# Patient Record
Sex: Male | Born: 1965 | Race: White | Hispanic: No | Smoking: Current some day smoker
Health system: Southern US, Community
[De-identification: ages and names within clinical notes are randomized; demographics above are authoritative.]

## PROBLEM LIST (undated history)

## (undated) DIAGNOSIS — D472 Monoclonal gammopathy: Secondary | ICD-10-CM

## (undated) DIAGNOSIS — M109 Gout, unspecified: Secondary | ICD-10-CM

## (undated) DIAGNOSIS — I10 Essential (primary) hypertension: Secondary | ICD-10-CM

## (undated) DIAGNOSIS — I639 Cerebral infarction, unspecified: Secondary | ICD-10-CM

## (undated) DIAGNOSIS — G4733 Obstructive sleep apnea (adult) (pediatric): Secondary | ICD-10-CM

## (undated) DIAGNOSIS — N183 Chronic kidney disease, stage 3 unspecified: Secondary | ICD-10-CM

## (undated) DIAGNOSIS — E669 Obesity, unspecified: Secondary | ICD-10-CM

## (undated) DIAGNOSIS — Z9989 Dependence on other enabling machines and devices: Secondary | ICD-10-CM

---
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
## 2012-04-07 DIAGNOSIS — I213 ST elevation (STEMI) myocardial infarction of unspecified site: Secondary | ICD-10-CM

## 2012-04-07 HISTORY — DX: ST elevation (STEMI) myocardial infarction of unspecified site: I21.3

## 2012-04-07 HISTORY — PX: CORONARY ANGIOPLASTY WITH STENT PLACEMENT: SHX49

## 2012-05-07 DIAGNOSIS — I2699 Other pulmonary embolism without acute cor pulmonale: Secondary | ICD-10-CM

## 2012-05-07 HISTORY — DX: Other pulmonary embolism without acute cor pulmonale: I26.99

## 2013-09-16 ENCOUNTER — Inpatient Hospital Stay (HOSPITAL_COMMUNITY)
Admission: EM | Admit: 2013-09-16 | Discharge: 2013-09-19 | DRG: 065 | Disposition: A | Discharge status: Home Health | Payer: BC Managed Care – PPO | Source: Other Acute Inpatient Hospital | Attending: Neurology | Admitting: Neurology

## 2013-09-16 ENCOUNTER — Encounter (HOSPITAL_COMMUNITY)
Admission: EM | Disposition: A | Discharge status: Home Health | Payer: Self-pay | Source: Other Acute Inpatient Hospital | Attending: Neurology

## 2013-09-16 ENCOUNTER — Other Ambulatory Visit: Payer: Self-pay | Admitting: Neurology

## 2013-09-16 ENCOUNTER — Inpatient Hospital Stay (HOSPITAL_COMMUNITY): Payer: BC Managed Care – PPO | Admitting: Certified Registered Nurse Anesthetist

## 2013-09-16 ENCOUNTER — Encounter (HOSPITAL_COMMUNITY): Payer: BC Managed Care – PPO | Admitting: Certified Registered Nurse Anesthetist

## 2013-09-16 ENCOUNTER — Ambulatory Visit (HOSPITAL_COMMUNITY)
Admission: RE | Admit: 2013-09-16 | Discharge: 2013-09-16 | Disposition: A | Discharge status: Home / Self Care | Payer: BC Managed Care – PPO | Source: Ambulatory Visit | Attending: Neurology | Admitting: Neurology

## 2013-09-16 DIAGNOSIS — Z86711 Personal history of pulmonary embolism: Secondary | ICD-10-CM | POA: Diagnosis not present

## 2013-09-16 DIAGNOSIS — Z23 Encounter for immunization: Secondary | ICD-10-CM

## 2013-09-16 DIAGNOSIS — R29898 Other symptoms and signs involving the musculoskeletal system: Secondary | ICD-10-CM | POA: Diagnosis present

## 2013-09-16 DIAGNOSIS — Z7982 Long term (current) use of aspirin: Secondary | ICD-10-CM

## 2013-09-16 DIAGNOSIS — Z9861 Coronary angioplasty status: Secondary | ICD-10-CM

## 2013-09-16 DIAGNOSIS — I252 Old myocardial infarction: Secondary | ICD-10-CM

## 2013-09-16 DIAGNOSIS — I6350 Cerebral infarction due to unspecified occlusion or stenosis of unspecified cerebral artery: Principal | ICD-10-CM | POA: Diagnosis present

## 2013-09-16 DIAGNOSIS — M4626 Osteomyelitis of vertebra, lumbar region: Secondary | ICD-10-CM

## 2013-09-16 DIAGNOSIS — G4733 Obstructive sleep apnea (adult) (pediatric): Secondary | ICD-10-CM | POA: Diagnosis present

## 2013-09-16 DIAGNOSIS — I2510 Atherosclerotic heart disease of native coronary artery without angina pectoris: Secondary | ICD-10-CM | POA: Diagnosis present

## 2013-09-16 DIAGNOSIS — Z6841 Body Mass Index (BMI) 40.0 and over, adult: Secondary | ICD-10-CM | POA: Diagnosis not present

## 2013-09-16 DIAGNOSIS — M109 Gout, unspecified: Secondary | ICD-10-CM | POA: Diagnosis present

## 2013-09-16 DIAGNOSIS — E7800 Pure hypercholesterolemia, unspecified: Secondary | ICD-10-CM | POA: Diagnosis present

## 2013-09-16 DIAGNOSIS — I10 Essential (primary) hypertension: Secondary | ICD-10-CM | POA: Diagnosis present

## 2013-09-16 DIAGNOSIS — M1A0691 Idiopathic chronic gout, unspecified knee, with tophus (tophi): Secondary | ICD-10-CM

## 2013-09-16 DIAGNOSIS — I639 Cerebral infarction, unspecified: Secondary | ICD-10-CM | POA: Diagnosis present

## 2013-09-16 DIAGNOSIS — R471 Dysarthria and anarthria: Secondary | ICD-10-CM | POA: Diagnosis present

## 2013-09-16 DIAGNOSIS — E669 Obesity, unspecified: Secondary | ICD-10-CM | POA: Diagnosis present

## 2013-09-16 DIAGNOSIS — G8190 Hemiplegia, unspecified affecting unspecified side: Secondary | ICD-10-CM | POA: Diagnosis present

## 2013-09-16 DIAGNOSIS — H539 Unspecified visual disturbance: Secondary | ICD-10-CM

## 2013-09-16 HISTORY — DX: Gout, unspecified: M10.9

## 2013-09-16 HISTORY — DX: Obesity, unspecified: E66.9

## 2013-09-16 HISTORY — DX: Essential (primary) hypertension: I10

## 2013-09-16 HISTORY — PX: RADIOLOGY WITH ANESTHESIA: SHX6223

## 2013-09-16 LAB — MRSA PCR SCREENING: MRSA BY PCR: POSITIVE — AB

## 2013-09-16 SURGERY — RADIOLOGY WITH ANESTHESIA
Anesthesia: General

## 2013-09-16 MED ORDER — ACETAMINOPHEN 325 MG PO TABS
650.0000 mg | ORAL_TABLET | ORAL | Status: DC | PRN
  Administered 2013-09-18: 650 mg via ORAL
  Filled 2013-09-16: qty 2

## 2013-09-16 MED ORDER — LABETALOL HCL 5 MG/ML IV SOLN
10.0000 mg | INTRAVENOUS | Status: DC | PRN
  Administered 2013-09-17 – 2013-09-18 (×2): 10 mg via INTRAVENOUS
  Filled 2013-09-16: qty 4

## 2013-09-16 MED ORDER — CHLORHEXIDINE GLUCONATE CLOTH 2 % EX PADS
6.0000 | MEDICATED_PAD | Freq: Every day | CUTANEOUS | Status: DC
  Administered 2013-09-17 – 2013-09-19 (×3): 6 via TOPICAL

## 2013-09-16 MED ORDER — ACETAMINOPHEN 650 MG RE SUPP
650.0000 mg | RECTAL | Status: DC | PRN
  Filled 2013-09-16: qty 1

## 2013-09-16 MED ORDER — SODIUM CHLORIDE 0.9 % IV SOLN
INTRAVENOUS | Status: DC | PRN
  Administered 2013-09-16: 14:00:00 via INTRAVENOUS

## 2013-09-16 MED ORDER — INFLUENZA VAC SPLIT QUAD 0.5 ML IM SUSY
0.5000 mL | PREFILLED_SYRINGE | INTRAMUSCULAR | Status: AC
  Administered 2013-09-17: 0.5 mL via INTRAMUSCULAR
  Filled 2013-09-16: qty 0.5

## 2013-09-16 MED ORDER — MUPIROCIN 2 % EX OINT
1.0000 "application " | TOPICAL_OINTMENT | Freq: Two times a day (BID) | CUTANEOUS | Status: DC
  Administered 2013-09-17 – 2013-09-19 (×5): 1 via NASAL
  Filled 2013-09-16: qty 22

## 2013-09-16 MED ORDER — PANTOPRAZOLE SODIUM 40 MG IV SOLR
40.0000 mg | Freq: Every day | INTRAVENOUS | Status: DC
  Administered 2013-09-16: 40 mg via INTRAVENOUS
  Filled 2013-09-16 (×2): qty 40

## 2013-09-16 MED ORDER — SODIUM CHLORIDE 0.9 % IV SOLN: INTRAVENOUS | Status: DC

## 2013-09-16 MED ORDER — SENNOSIDES-DOCUSATE SODIUM 8.6-50 MG PO TABS
1.0000 | ORAL_TABLET | Freq: Every evening | ORAL | Status: DC | PRN
  Filled 2013-09-16: qty 1

## 2013-09-16 MED ORDER — LIDOCAINE HCL 1 % IJ SOLN
INTRAMUSCULAR | Status: AC
  Filled 2013-09-16: qty 20

## 2013-09-16 MED ORDER — FENTANYL CITRATE 0.05 MG/ML IJ SOLN
INTRAMUSCULAR | Status: DC | PRN
Start: 2013-09-16 — End: 2013-09-16
  Administered 2013-09-16 (×2): 25 ug via INTRAVENOUS

## 2013-09-16 MED ORDER — IOHEXOL 300 MG/ML  SOLN
150.0000 mL | Freq: Once | INTRAMUSCULAR | Status: AC | PRN
  Administered 2013-09-16: 75 mL via INTRA_ARTERIAL

## 2013-09-16 MED ORDER — HYDRALAZINE HCL 20 MG/ML IJ SOLN
INTRAMUSCULAR | Status: DC | PRN
  Administered 2013-09-16: 5 mg via INTRAVENOUS

## 2013-09-16 MED ORDER — STROKE: EARLY STAGES OF RECOVERY BOOK
Freq: Once | Status: AC
  Administered 2013-09-17: 07:00:00
  Filled 2013-09-16: qty 1

## 2013-09-16 MED ORDER — SODIUM CHLORIDE 0.9 % IV SOLN
INTRAVENOUS | Status: DC
  Administered 2013-09-16 – 2013-09-18 (×3): via INTRAVENOUS

## 2013-09-16 MED ORDER — MIDAZOLAM HCL 5 MG/5ML IJ SOLN
INTRAMUSCULAR | Status: DC | PRN
  Administered 2013-09-16: 1 mg via INTRAVENOUS

## 2013-09-16 NOTE — Anesthesia Preprocedure Evaluation (Signed)
Anesthesia Evaluation  Patient identified by MRN, date of birth, ID band Patient awake    Reviewed: Allergy & Precautions, H&P , NPO status , Patient's Chart, lab work & pertinent test results  Airway Mallampati: II TM Distance: >3 FB Neck ROM: Full    Dental   Pulmonary          Cardiovascular + Cardiac Stents     Neuro/Psych    GI/Hepatic   Endo/Other    Renal/GU Renal InsufficiencyRenal disease     Musculoskeletal   Abdominal (+) + obese,   Peds  Hematology   Anesthesia Other Findings   Reproductive/Obstetrics                           Anesthesia Physical Anesthesia Plan  ASA: III  Anesthesia Plan: MAC and General   Post-op Pain Management:    Induction: Intravenous  Airway Management Planned: Oral ETT  Additional Equipment: Arterial line  Intra-op Plan:   Post-operative Plan: Extubation in OR  Informed Consent:   Dental advisory given  Plan Discussed with: Anesthesiologist and Surgeon  Anesthesia Plan Comments:         Anesthesia Quick Evaluation

## 2013-09-16 NOTE — Anesthesia Postprocedure Evaluation (Signed)
  Anesthesia Post-op Note  Patient: Jesus Cruz  Procedure(s) Performed: Procedure(s): RADIOLOGY WITH ANESTHESIA (N/A)  Patient Location: PACU  Anesthesia Type:General  Level of Consciousness: awake, alert  and oriented  Airway and Oxygen Therapy: Patient Spontanous Breathing and Patient connected to nasal cannula oxygen  Post-op Pain: mild  Post-op Assessment: Post-op Vital signs reviewed, Patient's Cardiovascular Status Stable, Respiratory Function Stable, Patent Airway, No signs of Nausea or vomiting and Pain level controlled  Post-op Vital Signs: Reviewed and stable  Last Vitals:  Filed Vitals:   09/16/13 1600  BP: 159/91  Pulse: 84  Temp:   Resp: 18    Complications: No apparent anesthesia complications   Patient uses CPAP at home.  Might benefit from it tonight.  Also raise head up when able to facilitate respirations.

## 2013-09-16 NOTE — Progress Notes (Signed)
First puncture rt groin per Dr. Estanislado Pandy

## 2013-09-16 NOTE — Code Documentation (Signed)
48yo male arriving to [facility] at 1400 via [place] as a transfer from [facility].  Patient met on arrival by the Stroke RN and taken directly to IR suite by [facility] staff.  Report received from [facility] staff:  Patient was admitted to [facility] on 09/12/2013 for respiratory failure.  Patient required bipap, but was improving and discharge was planned.  A physician evaluated the patient this morning at 0900 and he was at his baseline.  At approximately 1038 the patient was working with therapy and was noticed to have sudden onset left sided weakness, slurred speech, and visual disturbance. NIHSS 8 per report. Patient was treated with tPA at 1210 at [facility].  Patient was transferred to [facility] by [facility].  Patient assessed on arrival and NIHSS 6, see documentation for details and times. Dr. Estanislado Pandy and Dr. Armida Sans at the bedside.  Plan to go ahead with catheter angiogram.  Bedside handoff with IR RN Sonia Baller.

## 2013-09-16 NOTE — Procedures (Signed)
S/P 4 vessel cerebral arteriogram. RT CFA approach. Findings. 1.No occlusions,stenosis or intraluminal filling defects seen. 2.No aneurysm or dissections or AV shunting seen  Venous outflow WNLS

## 2013-09-16 NOTE — Consult Note (Signed)
Referring Phys[facility]: [facility]     Chief Complaint: transfer code stroke s/p IV thrombolysis and possible need for endovascular intervention  HPI:                                                                                                                                         Jesus Cruz is an 48 y.o. male with a past medical history that is relevant for HTN, hypercholesterolemia, CAD s/p stent deployment, MI, OSA, cerebral infarct without apparent residual deficits, PE,  transferred to [facility] to evaluate need for endovascular intervention after receiving IV thrombolysis for right brain infarct. He was to [facility] on 09/12/2013 for respiratory failure. Patient required bipap, but was improving and discharge was planned. However, this morning around 1038 while working with his therapist he was noted to have left sided weakness, dysarthria, and visual changes. Initial NIHSS 8 at [facility] and after discussion with the neurologist at that institution the decision was made to administer IV tpa and pursue further neuro-imaging to determine whether or not endovascular intervention was required. Upon arrival to [facility] patient had NIHSS 6 and was taken emergently to the angio suite where a cerebral angiogram disclosed not evidence of a clot/thrombus. At this moment, patient is wake and following commands but with dysarthria and left hemiparesis.  Denies HA, vertigo, double vision, difficulty swallowing, confusion, CP, SOB, or palpitations.  Date last known well: 09/16/13 Time last known well: 1038 am tPA Given: yes NIHSS: 6   No past medical history on file.  No past surgical history on file.  No family history on file. Social History:  has no tobacco, alcohol, and drug history on file.  Allergies: Not on File  Medications:                                                                                                                           Scheduled: .  stroke:  mapping our early stages of recovery book   Does not apply Once  . [START ON 09/17/2013] Influenza vac split quadrivalent PF  0.5 mL Intramuscular Tomorrow-1000  . pantoprazole (PROTONIX) IV  40 mg Intravenous QHS    ROS:  History obtained from the patient and chart review  General ROS: negative for - chills, fatigue, fever, night sweats, or weight loss Psychological ROS: negative for - behavioral disorder, hallucinations, memory difficulties, mood swings or suicidal ideation Ophthalmic ROS: negative for - blurry vision, double vision, eye pain or loss of vision ENT ROS: negative for - epistaxis, nasal discharge, oral lesions, sore throat, tinnitus or vertigo Allergy and Immunology ROS: negative for - hives or itchy/watery eyes Hematological and Lymphatic ROS: negative for - bleeding problems, bruising or swollen lymph nodes Endocrine ROS: negative for - galactorrhea, hair pattern changes, polydipsia/polyuria or temperature intolerance Respiratory ROS: negative for - cough, hemoptysis, shortness of breath or wheezing Cardiovascular ROS: negative for - chest pain, dyspnea on exertion, edema or irregular heartbeat Gastrointestinal ROS: negative for - abdominal pain, diarrhea, hematemesis, nausea/vomiting or stool incontinence Genito-Urinary ROS: negative for - dysuria, hematuria, incontinence or urinary frequency/urgency Musculoskeletal ROS: negative for - joint swelling Neurological ROS: as noted in HPI Dermatological ROS: negative for rash and skin lesion changes  Physical exam: pleasant male in no apparent distress. Blood pressure 144/97, pulse 84, temperature 97.5 F (36.4 C), resp. rate 18, height 5\' 7"  (1.702 m), weight 130.3 kg (287 lb 4.2 oz), SpO2 100.00%. Head: normocephalic. Neck: supple, no bruits, no JVD. Cardiac: no murmurs. Lungs:  clear. Abdomen: soft, no tender, no mass. Extremities: no edema. Neurologic Examination:                                                                                                      General: Mental Status: Alert, oriented, thought content appropriate. Dysarthric. .  Able to follow 3 step commands without difficulty. Cranial Nerves: II: Discs flat bilaterally; Visual fields grossly normal, pupils equal, round, reactive to light and accommodation III,IV, VI: ptosis not present, extra-ocular motions intact bilaterally V,VII: smile asymmetric due to mild left face weakness, facial light touch sensation normal bilaterally VIII: hearing normal bilaterally IX,X: gag reflex present XI: bilateral shoulder shrug XII: midline tongue extension without atrophy or fasciculations Motor: Mild left sided weakness. Tone and bulk:normal tone throughout; no atrophy noted Sensory: Pinprick and light touch intact throughout, bilaterally Deep Tendon Reflexes:  1+ all over Plantars: Right: downgoing   Left: downgoing Cerebellar: normal finger-to-nose,  Did not test heel-shin Gait:  Unable to test  No results found for this or any previous visit (from the past 48 hour(s)). No results found.    Assessment: 48 y.o. male with multiple risk factors for stroke presenting with a syndrome consisting with a right brain infarct. Received IV tpa (initial NIHSS 8). Angiogram disclosed not evidence of clots/thrombus. Complete stroke work up. Aspirin as per post IV tpa protocol. Stroke team will resume care in the morning.  Stroke Risk Factors - HTN, hypercholesterolemia, CAD, prior stroke   Plan: 1. HgbA1c, fasting lipid panel 2. MRI, MRA  of the brain without contrast 3. Echocardiogram 4. Carotid dopplers 5. Prophylactic therapy-Antiplatelet med: Aspirin as per protocol 6. Risk factor modification 7. Telemetry monitoring 8. Frequent neuro checks 9. PT/OT SLP  [name], MD [facility] [phone]  09/16/2013, 5:06 PM

## 2013-09-16 NOTE — Transfer of Care (Signed)
Immediate Anesthesia Transfer of Care Note  Patient: Jesus Cruz  Procedure(s) Performed: Procedure(s): RADIOLOGY WITH ANESTHESIA (N/A)  Patient Location: PACU  Anesthesia Type:MAC  Level of Consciousness: awake, alert  and oriented  Airway & Oxygen Therapy: Patient Spontanous Breathing  Post-op Assessment: Report given to PACU RN  Post vital signs: Reviewed and stable  Complications: No apparent anesthesia complications

## 2013-09-17 ENCOUNTER — Encounter (HOSPITAL_COMMUNITY): Payer: Self-pay | Admitting: *Deleted

## 2013-09-17 ENCOUNTER — Inpatient Hospital Stay (HOSPITAL_COMMUNITY): Payer: BC Managed Care – PPO

## 2013-09-17 DIAGNOSIS — I517 Cardiomegaly: Secondary | ICD-10-CM

## 2013-09-17 LAB — LIPID PANEL
CHOL/HDL RATIO: 4 ratio
Cholesterol: 76 mg/dL (ref 0–200)
HDL: 19 mg/dL — ABNORMAL LOW (ref >39)
LDL CALC: 36 mg/dL (ref 0–99)
Triglycerides: 104 mg/dL (ref <150)
VLDL: 21 mg/dL (ref 0–40)

## 2013-09-17 LAB — HEMOGLOBIN A1C
Hgb A1c MFr Bld: 6.1 % — ABNORMAL HIGH (ref <5.7)
MEAN PLASMA GLUCOSE: 128 mg/dL — AB (ref <117)

## 2013-09-17 MED ORDER — ASPIRIN 325 MG PO TABS: 325.0000 mg | ORAL_TABLET | Freq: Every day | ORAL | Status: DC

## 2013-09-17 MED ORDER — ASPIRIN 300 MG RE SUPP
300.0000 mg | Freq: Every day | RECTAL | Status: DC
  Administered 2013-09-17: 300 mg via RECTAL
  Filled 2013-09-17: qty 1

## 2013-09-17 MED ORDER — HEPARIN SODIUM (PORCINE) 5000 UNIT/ML IJ SOLN
5000.0000 [IU] | Freq: Three times a day (TID) | INTRAMUSCULAR | Status: DC
  Administered 2013-09-18 – 2013-09-19 (×5): 5000 [IU] via SUBCUTANEOUS
  Filled 2013-09-17 (×5): qty 1

## 2013-09-17 NOTE — Progress Notes (Signed)
Echocardiogram 2D Echocardiogram has been performed.  Jesus Cruz 09/17/2013, 12:46 PM

## 2013-09-17 NOTE — Progress Notes (Signed)
OT Cancellation Note  Patient Details Name: Jesus Cruz MRN: [id number] DOB: 1965/03/09   Cancelled Treatment:    Reason Eval/Treat Not Completed: Patient not medically ready (strict bedrest order set)  Peri Maris Pager: [phone]  09/17/2013, 7:49 AM

## 2013-09-17 NOTE — Progress Notes (Addendum)
*  PRELIMINARY RESULTS* Vascular Ultrasound Carotid Duplex (Doppler) has been completed.  Preliminary findings: Bilateral:  1-39% ICA stenosis.  Vertebral artery flow is antegrade.   Right ICA could not be fully evaluated due to IJ line.    Landry Mellow, RDMS, RVT  09/17/2013, 4:06 PM

## 2013-09-17 NOTE — Progress Notes (Signed)
UR completed.  Magdelene Ruark, RN BSN [facility] CCM Trauma/Neuro ICU Case Manager [phone]  

## 2013-09-17 NOTE — Progress Notes (Signed)
SLP Cancellation Note  Patient Details Name: Jesus Cruz MRN: [id number] DOB: 10-24-1965   Cancelled treatment:       Reason Eval/Treat Not Completed: Patient at procedure or test/unavailable. Pt lying flat, leaving for MRI. Will f/u when pt able to participate.   Herbie [place], [place] CCC-SLP [phone]  Lynann Beaver 09/17/2013, 8:46 AM

## 2013-09-17 NOTE — Progress Notes (Signed)
PT Cancellation Note  Patient Details Name: Jesus Cruz MRN: [id number] DOB: 1965-12-04   Cancelled Treatment:    Reason Eval/Treat Not Completed: Patient not medically ready (active bedrest orders, will see when updated)   Duncan Dull 09/17/2013, 7:30 AM Alben Deeds, [place] DPT  [phone]

## 2013-09-17 NOTE — Progress Notes (Addendum)
Stroke Team Progress Note  HISTORY Jesus Cruz is an 48 y.o. male with a past medical history that is relevant for HTN, hypercholesterolemia, CAD s/p stent deployment, MI, OSA, cerebral infarct without apparent residual deficits, PE, transferred to [facility] to evaluate need for endovascular intervention after receiving IV thrombolysis for right brain infarct.  He was to [facility] on 09/12/2013 for respiratory failure. Patient required bipap, but was improving and discharge was planned. However, this morning around 1038 while working with his therapist he was noted to have left sided weakness, dysarthria, and visual changes. Initial NIHSS 8 at [facility] and after discussion with the neurologist at that institution the decision was made to administer IV tpa and pursue further neuro-imaging to determine whether or not endovascular intervention was required.  Upon arrival to [facility] patient had NIHSS 6 and was taken emergently to the angio suite where a cerebral angiogram disclosed not evidence of a clot/thrombus.  At this moment, patient is wake and following commands but with dysarthria and left hemiparesis.  Denies HA, vertigo, double vision, difficulty swallowing, confusion, CP, SOB, or palpitations.   Date last known well: 09/16/13  Time last known well: 1038 am  tPA Given: yes at 12:10pm on 9/10 NIHSS: 6  Patient was administerd TPA and taken to IR secondary to left sided weakness and dysarthria. He was admitted to the neuro ICU for further evaluation and treatment.  SUBJECTIVE His nurse is at bedside. Reports no overnight events. No family at bedside. Remains alert and responsive.   OBJECTIVE Most recent Vital Signs: Filed Vitals:   09/17/13 0419 09/17/13 0500 09/17/13 0600 09/17/13 0700  BP:  125/93 145/97 143/106  Pulse:  86 86 89  Temp: 97.4 F (36.3 C)     TempSrc: Oral     Resp:  18 18 19   Height:      Weight:      SpO2:  100% 97% 97%   CBG (last 3)  No results found  for this basename: GLUCAP,  in the last 72 hours  IV Fluid Intake:   . sodium chloride 75 mL/hr at 09/16/13 2000    MEDICATIONS  . Chlorhexidine Gluconate Cloth  6 each Topical Q0600  . Influenza vac split quadrivalent PF  0.5 mL Intramuscular Tomorrow-1000  . mupirocin ointment  1 application Nasal BID  . pantoprazole (PROTONIX) IV  40 mg Intravenous QHS   PRN:  acetaminophen, acetaminophen, labetalol, senna-docusate  Diet:  NPO  Activity:  bedrest DVT Prophylaxis:  SCD  CLINICALLY SIGNIFICANT STUDIES Basic Metabolic Panel: No results found for this basename: NA, K, CL, CO2, GLUCOSE, BUN, CREATININE, CALCIUM, MG, PHOS,  in the last 168 hours Liver Function Tests: No results found for this basename: AST, ALT, ALKPHOS, BILITOT, PROT, ALBUMIN,  in the last 168 hours CBC: No results found for this basename: WBC, NEUTROABS, HGB, HCT, MCV, PLT,  in the last 168 hours Coagulation: No results found for this basename: LABPROT, INR,  in the last 168 hours Cardiac Enzymes: No results found for this basename: CKTOTAL, CKMB, CKMBINDEX, TROPONINI,  in the last 168 hours Urinalysis: No results found for this basename: COLORURINE, APPERANCEUR, LABSPEC, PHURINE, GLUCOSEU, HGBUR, BILIRUBINUR, KETONESUR, PROTEINUR, UROBILINOGEN, NITRITE, LEUKOCYTESUR,  in the last 168 hours Lipid Panel    Component Value Date/Time   CHOL 76 09/17/2013 0430   TRIG 104 09/17/2013 0430   HDL 19* 09/17/2013 0430   CHOLHDL 4.0 09/17/2013 0430   VLDL 21 09/17/2013 0430   LDLCALC 36 09/17/2013 0430  HgbA1C  No results found for this basename: HGBA1C    Urine Drug Screen:   No results found for this basename: labopia, cocainscrnur, labbenz, amphetmu, thcu, labbarb    Alcohol Level: No results found for this basename: ETH,  in the last 168 hours  No results found.  CT of the brain    MRI of the brain    MRA of the brain    2D Echocardiogram    Carotid Doppler    CXR    Therapy Recommendations  pending  Physical Exam   General: sheath remains in place Mental Status:  Alert, oriented, thought content appropriate. Dysarthric. Able to follow 3 step commands without difficulty.  Cranial Nerves:  II: Visual fields grossly normal, pupils equal, round, reactive to light and accommodation  III,IV, VI: ptosis not present, extra-ocular motions intact bilaterally  V,VII: minimal left facial weakness, light touch sensation normal bilaterally  VIII: hearing normal bilaterally  Motor:  Mild left sided weakness.  Tone and bulk:normal tone throughout; no atrophy noted  Sensory: Pinprick and light touch intact throughout, bilaterally  Deep Tendon Reflexes:  1+ all over  Plantars:  Right: downgoing Left: downgoing  Cerebellar:  normal finger-to-nose, Did not test heel-shin  Gait:  Unable to test  ASSESSMENT Jesus Cruz is a 48 y.o. male presenting with left sided weakness and dysarthria. Status post IV t-PA  at 12:10pm on 9/10. Imaging (arteriogram) shows no occlusions, stenosis, aneurysms. On ASA 81mg  prior to admission. Now all antithrombotic therapy held secondary to recent tPA. Patient with resultant mild dysarthria. Stroke work up underway.   HTN  Gout   Hospital day # 1  TREATMENT/PLAN  MRI/A brain  Pending imaging will plan to start ASA 325mg  if 24hr imaging stable  2D echo  Carotid doppler  Tele monitoring  Rehab evaluation pending  Hold home BP meds, can reintroduce slowly if BP increases  Hold statin for LDL of 36   I have personally obtained a history, examined the patient, evaluated imaging results, and formulated the assessment and plan of care. I agree with the above.  Jim Like, DO [facility] Pager: [phone]   This patient is critically ill and at significant risk of neurological worsening, death and care requires constant monitoring of vital signs, hemodynamics,respiratory and cardiac monitoring,review of multiple databases,  neurological assessment, discussion with family, other specialists and medical decision making of high complexity. I spent 35 inutes of neurocritical care time in the care of this patient.    To contact Stroke Continuity provider, please refer to [email or web address] After hours, contact General Neurology

## 2013-09-17 NOTE — Progress Notes (Signed)
5 fr RFA sheath removed using exoseal closure device at 1025.  Manual pressure applied for 5 minutes.  Groin site soft and reviewed with RN.  Distal pulses 2+.  Light pressure dressing applied.

## 2013-09-18 DIAGNOSIS — M869 Osteomyelitis, unspecified: Secondary | ICD-10-CM

## 2013-09-18 DIAGNOSIS — I10 Essential (primary) hypertension: Secondary | ICD-10-CM

## 2013-09-18 DIAGNOSIS — I2510 Atherosclerotic heart disease of native coronary artery without angina pectoris: Secondary | ICD-10-CM

## 2013-09-18 DIAGNOSIS — M1A00X1 Idiopathic chronic gout, unspecified site, with tophus (tophi): Secondary | ICD-10-CM

## 2013-09-18 DIAGNOSIS — I6350 Cerebral infarction due to unspecified occlusion or stenosis of unspecified cerebral artery: Principal | ICD-10-CM

## 2013-09-18 MED ORDER — AMLODIPINE BESYLATE 10 MG PO TABS
10.0000 mg | ORAL_TABLET | Freq: Every day | ORAL | Status: DC
  Administered 2013-09-18 – 2013-09-19 (×2): 10 mg via ORAL
  Filled 2013-09-18 (×2): qty 1

## 2013-09-18 MED ORDER — HYDROCODONE-ACETAMINOPHEN 10-325 MG PO TABS
1.0000 | ORAL_TABLET | Freq: Four times a day (QID) | ORAL | Status: DC | PRN
  Administered 2013-09-18 – 2013-09-19 (×4): 1 via ORAL
  Filled 2013-09-18 (×4): qty 1

## 2013-09-18 MED ORDER — COLCHICINE 0.6 MG PO TABS
0.6000 mg | ORAL_TABLET | Freq: Every day | ORAL | Status: DC
  Administered 2013-09-18 – 2013-09-19 (×2): 0.6 mg via ORAL
  Filled 2013-09-18 (×2): qty 1

## 2013-09-18 MED ORDER — METOPROLOL SUCCINATE ER 100 MG PO TB24: 300.0000 mg | ORAL_TABLET | Freq: Every day | ORAL | Status: DC

## 2013-09-18 MED ORDER — ASPIRIN 325 MG PO TABS
ORAL_TABLET | ORAL | Status: AC
  Administered 2013-09-18: 325 mg via ORAL
  Filled 2013-09-18: qty 1

## 2013-09-18 MED ORDER — METOPROLOL SUCCINATE ER 25 MG PO TB24
150.0000 mg | ORAL_TABLET | Freq: Every day | ORAL | Status: DC
  Administered 2013-09-18 – 2013-09-19 (×2): 150 mg via ORAL
  Filled 2013-09-18 (×2): qty 2
  Filled 2013-09-18 (×2): qty 1

## 2013-09-18 MED ORDER — ASPIRIN 300 MG RE SUPP: 300.0000 mg | Freq: Every day | RECTAL | Status: DC

## 2013-09-18 MED ORDER — ASPIRIN 325 MG PO TABS
325.0000 mg | ORAL_TABLET | Freq: Every day | ORAL | Status: DC
  Administered 2013-09-18 – 2013-09-19 (×2): 325 mg via ORAL
  Filled 2013-09-18: qty 1

## 2013-09-18 MED ORDER — PREGABALIN 75 MG PO CAPS
150.0000 mg | ORAL_CAPSULE | Freq: Two times a day (BID) | ORAL | Status: DC
  Administered 2013-09-18 – 2013-09-19 (×3): 150 mg via ORAL
  Filled 2013-09-18 (×3): qty 2

## 2013-09-18 MED ORDER — ALLOPURINOL 100 MG PO TABS
300.0000 mg | ORAL_TABLET | Freq: Every day | ORAL | Status: DC
  Administered 2013-09-18 – 2013-09-19 (×2): 300 mg via ORAL
  Filled 2013-09-18 (×2): qty 3

## 2013-09-18 NOTE — Evaluation (Signed)
Physical Therapy Evaluation Patient Details Name: Jesus Cruz MRN: [id number] DOB: 07-Sep-1965 Today's Date: 09/18/2013   History of Present Illness  48 y.o. male presenting with left sided weakness and dysarthria. Status post IV t-PA at 12:10pm on 9/10. Imaging (arteriogram) shows no occlusions, stenosis, aneurysms. MRI negative for CVA. PMH relevant for HTN, hypercholesterolemia, CAD s/p stent deployment, MI, OSA, cerebral infarct without apparent residual deficits.  Clinical Impression  Pt adm due to above. Pt presents with decreased independence with functional mobility secondary to deficits listed below and primarily limited today due to pain. Pt c/o "gout flare" up at this time. Anticipate pt will be safe from mobility standpoint to D/C home with family when pain is resolved. Pt to benefit from skilled acute PT to maximize functional mobility prior to returning home.     Follow Up Recommendations Home health PT;Supervision/Assistance - 24 hour    Equipment Recommendations  None recommended by PT    Recommendations for Other Services OT consult     Precautions / Restrictions Precautions Precautions: Fall Precaution Comments: pt in severe pain; c/o "gout pain all over body"  Restrictions Weight Bearing Restrictions: No      Mobility  Bed Mobility Overal bed mobility: Modified Independent             General bed mobility comments: relied on handrails; HOB elevated  Transfers Overall transfer level: Needs assistance Equipment used: Rolling walker (2 wheeled) Transfers: Sit to/from [facility] Sit to Stand: Min assist Stand pivot transfers: Min assist       General transfer comment: 2 people were present for safety due to pt incr pain; pt performed sit to stand x 2 ; required sitting rest break initially due to pain; pt limited to pivotal steps only due to pain; increased fwd flexed posture; staed this is baseline due to chronic pain; min (A) for RW  management and pain   Ambulation/Gait             General Gait Details: limited to pivotal steps only due to pain  Stairs            Wheelchair Mobility    Modified Rankin (Stroke Patients Only)       Balance Overall balance assessment: Needs assistance Sitting-balance support: Feet supported;No upper extremity supported Sitting balance-Leahy Scale: Fair     Standing balance support: During functional activity;Bilateral upper extremity supported Standing balance-Leahy Scale: Poor Standing balance comment: required RW for UE support                             Pertinent Vitals/Pain Pain Assessment: 0-10 Pain Score: 10-Worst pain ever Pain Location: "all over my body" Pain Descriptors / Indicators:  ("gout like pain") Pain Intervention(s): Limited activity within patient's tolerance;Patient requesting pain meds-RN notified;Repositioned    Home Living Family/patient expects to be discharged to:: Private residence Living Arrangements: Parent Available Help at Discharge: Family;Available 24 hours/day Type of Home: House Home Access: Ramped entrance     Home Layout: One level Home Equipment: Walker - 2 wheels;Grab bars - tub/shower;Grab bars - toilet;Wheelchair - Sport and exercise psychologist Comments: pt reports his house is handicap accessible     Prior Function Level of Independence: Independent with assistive device(s)         Comments: ambulates with RW at all times due to chronic back pain     Hand Dominance        Extremity/Trunk Assessment   Upper  Extremity Assessment: Defer to OT evaluation           Lower Extremity Assessment: Generalized weakness      Cervical / Trunk Assessment: Kyphotic;Other exceptions  Communication   Communication: No difficulties  Cognition Arousal/Alertness: Awake/alert Behavior During Therapy: Anxious Overall Cognitive Status: No family/caregiver present to determine baseline cognitive  functioning Area of Impairment: Problem solving             Problem Solving: Slow processing;Decreased initiation;Requires verbal cues;Requires tactile cues General Comments: no family present; pt was appropriate and able to answer all questions but appeared very anxious and distracted throughout sessioni; increased tremors with activity    General Comments General comments (skin integrity, edema, etc.): pt tearful and hopeful to D/C home ASAP; discussed rehab goals    Exercises        Assessment/Plan    PT Assessment Patient needs continued PT services  PT Diagnosis Difficulty walking;Generalized weakness;Acute pain   PT Problem List Decreased strength;Decreased activity tolerance;Decreased balance;Decreased mobility;Decreased cognition;Pain;Obesity  PT Treatment Interventions DME instruction;Gait training;Functional mobility training;Therapeutic activities;Therapeutic exercise;Balance training;Neuromuscular re-education;Patient/family education   PT Goals (Current goals can be found in the Care Plan section) Acute Rehab PT Goals Patient Stated Goal: "i just want to go home" PT Goal Formulation: With patient Time For Goal Achievement: 09/25/13 Potential to Achieve Goals: Good    Frequency Min 3X/week   Barriers to discharge        Co-evaluation               End of Session Equipment Utilized During Treatment: Gait belt Activity Tolerance: Patient limited by pain Patient left: in bed;with call bell/phone within reach;with chair alarm set;Other (comment) (MD present ) Nurse Communication: Mobility status;Patient requests pain meds         Time: 8921-1941 PT Time Calculation (min): 18 min   Charges:   PT Evaluation $Initial PT Evaluation Tier I: 1 Procedure PT Treatments $Therapeutic Activity: 8-22 mins   PT G CodesGustavus Bryant, [place]  [phone] 09/18/2013, 11:53 AM
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
## 2013-09-18 NOTE — Evaluation (Signed)
Clinical/Bedside Swallow Evaluation Patient Details  Name: Jesus Cruz MRN: [id number] Date of Birth: [birth year]  Today's Date: 09/18/2013 Time: 0802-0813 SLP Time Calculation (min): 11 min  Past Medical History:  Past Medical History  Diagnosis Date  . Gout   . Obesity   . Hypertension    Past Surgical History: History reviewed. No pertinent past surgical history. HPI:  Mr. Jesus Cruz is a 48 y.o. male presenting with left sided weakness and dysarthria. Status post IV t-PA at 12:10pm on 9/10. Imaging (arteriogram) shows no occlusions, stenosis, aneurysms. MRI negative for CVA. PMH relevant for HTN, hypercholesterolemia, CAD s/p stent deployment, MI, OSA, cerebral infarct without apparent residual deficits, PE. Pt reports residual dysarthria from prior CVA, no longer recieiving services. He had 1 month of thickened liquids following initial CVA, but has been consuming regular soldi and thin liquids all summer.    Assessment / Plan / Recommendation Clinical Impression  Pt demonstrates adequate oral and oropharyngeal function with no evidence of aspiration. He is able to independently compensate for residual right lingual weakness by moving bolus to left side for mastication. No pocketing observed. Pt may initiate a regular diet and thin liquids. Pt screened for cognitive linguistic deficits. Pt has a mild residual dysarthria, otherwise no acute changes. Recommend pt f/u with home health SLP for further progress.     Aspiration Risk  Mild    Diet Recommendation Regular;Thin liquid   Liquid Administration via: Cup;Straw Medication Administration: Whole meds with liquid Supervision: Patient able to self feed Postural Changes and/or Swallow Maneuvers: Seated upright 90 degrees    Other  Recommendations Oral Care Recommendations: Oral care BID   Follow Up Recommendations  Outpatient SLP (for dysarthria)    Frequency and Duration        Pertinent Vitals/Pain NA    SLP Swallow  Goals     Swallow Study Prior Functional Status       General HPI: Mr. Jesus Cruz is a 48 y.o. male presenting with left sided weakness and dysarthria. Status post IV t-PA at 12:10pm on 9/10. Imaging (arteriogram) shows no occlusions, stenosis, aneurysms. MRI negative for CVA. PMH relevant for HTN, hypercholesterolemia, CAD s/p stent deployment, MI, OSA, cerebral infarct without apparent residual deficits, PE. Pt reports residual dysarthria from prior CVA, no longer recieiving services. He had 1 month of thickened liquids following initial CVA, but has been consuming regular soldi and thin liquids all summer.  Type of Study: Bedside swallow evaluation Previous Swallow Assessment: not in records Diet Prior to this Study: NPO Temperature Spikes Noted: No Respiratory Status: Room air History of Recent Intubation: No Behavior/Cognition: Alert;Cooperative Oral Cavity - Dentition: Adequate natural dentition Self-Feeding Abilities: Able to feed self Patient Positioning: Upright in bed Baseline Vocal Quality: Clear Volitional Cough: Strong Volitional Swallow: Able to elicit    Oral/Motor/Sensory Function Overall Oral Motor/Sensory Function: Impaired at baseline Labial ROM: Within Functional Limits Labial Symmetry: Within Functional Limits Labial Strength: Within Functional Limits Labial Sensation: Within Functional Limits Lingual ROM: Reduced right Lingual Symmetry: Abnormal symmetry right Lingual Strength: Reduced Lingual Sensation: Reduced Facial ROM: Within Functional Limits Facial Symmetry: Within Functional Limits Facial Strength: Within Functional Limits Facial Sensation: Within Functional Limits   Ice Chips Ice chips: Not tested   Thin Liquid Thin Liquid: Within functional limits    Nectar Thick Nectar Thick Liquid: Not tested   Honey Thick Honey Thick Liquid: Not tested   Puree Puree: Within functional limits   Solid   GO    Solid:  Within functional limits       [facility], MA CCC-SLP [phone]  Lynann Beaver 09/18/2013,8:17 AM

## 2013-09-19 DIAGNOSIS — I6350 Cerebral infarction due to unspecified occlusion or stenosis of unspecified cerebral artery: Secondary | ICD-10-CM

## 2013-09-19 MED ORDER — ASPIRIN EC 325 MG PO TBEC: 325.0000 mg | DELAYED_RELEASE_TABLET | Freq: Every day | ORAL | Status: DC

## 2013-09-19 NOTE — Progress Notes (Signed)
Discharge education completed by RN. Pt received a copy of discharge paperwork and confirms understanding of follow up appointments and discharge medications. Pt denies any questions at this time. No IV present at start of shift. Pt will discharge from the unit via wheelchair.
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
## 2013-09-19 NOTE — Progress Notes (Signed)
Physical Therapy Treatment Patient Details Name: Jesus Cruz MRN: [id number] DOB: May 01, 1965 Today's Date: 09/19/2013    History of Present Illness 48 y.o. male presenting with left sided weakness and dysarthria. Status post IV t-PA at 12:10pm on 9/10. Imaging (arteriogram) shows no occlusions, stenosis, aneurysms. MRI negative for CVA. PMH relevant for HTN, hypercholesterolemia, CAD s/p stent deployment, MI, OSA, cerebral infarct without apparent residual deficits.    PT Comments    Pt very pleasant & motivated.  Cont's to report gout pain "all over body" but was able to increase ambulation distance this session.  Cont with current POC.      Follow Up Recommendations  Home health PT;Supervision/Assistance - 24 hour     Equipment Recommendations  None recommended by PT    Recommendations for Other Services OT consult     Precautions / Restrictions Precautions Precautions: Fall Precaution Comments: pt in severe pain; c/o "gout pain all over body"  Restrictions Weight Bearing Restrictions: No    Mobility  Bed Mobility Overal bed mobility: Modified Independent                Transfers Overall transfer level: Needs assistance Equipment used: Rolling walker (2 wheeled) Transfers: Sit to/from Stand Sit to Stand: Min guard         General transfer comment: guarding for safety  Ambulation/Gait Ambulation/Gait assistance: Min guard (+2 to follow with recliner for safety ) Ambulation Distance (Feet): 120 Feet Assistive device: Rolling walker (2 wheeled) Gait Pattern/deviations: Decreased stride length;Decreased weight shift to right;Decreased weight shift to left;Step-to pattern;Decreased step length - right Gait velocity: decreased   General Gait Details: slow but steady.  Fatigues quickly & cont's to report pain.     Stairs            Wheelchair Mobility    Modified Rankin (Stroke Patients Only)       Balance                                     Cognition Arousal/Alertness: Awake/alert Behavior During Therapy: WFL for tasks assessed/performed Overall Cognitive Status: No family/caregiver present to determine baseline cognitive functioning                      Exercises      General Comments        Pertinent Vitals/Pain Pain Assessment: 0-10 Pain Score: 7  Pain Location: "all over my body" Pain Descriptors / Indicators:  ("gout like pain") Pain Intervention(s): Monitored during session;Repositioned;Premedicated before session    Home Living                      Prior Function            PT Goals (current goals can now be found in the care plan section) Acute Rehab PT Goals PT Goal Formulation: With patient Time For Goal Achievement: 09/25/13 Potential to Achieve Goals: Good Progress towards PT goals: Progressing toward goals    Frequency  Min 3X/week    PT Plan Current plan remains appropriate    Co-evaluation             End of Session   Activity Tolerance: Patient tolerated treatment well Patient left: in chair     Time: 0805-0825 PT Time Calculation (min): 20 min  Charges:  $Gait Training: 8-22 mins $Therapeutic Activity: 8-22 mins  G Codes:      Tricia, [place] 09/19/2013, 11:59 AM   Sarajane Marek, PTA [phone] 09/19/2013

## 2013-09-19 NOTE — Evaluation (Addendum)
Occupational Therapy Evaluation Patient Details Name: Jesus Cruz MRN: [id number] DOB: [birth year] Today's Date: 09/19/2013    History of Present Illness 48 y.o. male presenting with left sided weakness and dysarthria. Status post IV t-PA at 12:10pm on 9/10. Imaging (arteriogram) shows no occlusions, stenosis, aneurysms. MRI negative for CVA. PMH relevant for HTN, hypercholesterolemia, CAD s/p stent deployment, MI, OSA, cerebral infarct without apparent residual deficits.   Clinical Impression   Pt s/p above. Pt independent with ADLs, PTA. Feel pt will benefit from acute OT to increase independence prior to d/c.     Follow Up Recommendations  No OT follow up;Supervision/Assistance - 24 hour    Equipment Recommendations  3 in 1 bedside comode    Recommendations for Other Services       Precautions / Restrictions Precautions Precautions: Fall Restrictions Weight Bearing Restrictions: No      Mobility Bed Mobility Overal bed mobility: Modified Independent                Transfers Overall transfer level: Needs assistance Equipment used: Rolling walker (2 wheeled) Transfers: Sit to/from Stand Sit to Stand: Min guard Comments: educated on hand placement.              Balance                                            ADL Overall ADL's : Needs assistance/impaired     Grooming: Wash/dry hands;Supervision/safety;Standing;Set up               Lower Body Dressing: Sit to/from stand;Minimal assistance   Toilet Transfer: Min guard;Ambulation;RW;Comfort height toilet   Toileting- Clothing Manipulation and Hygiene: Supervision/safety;Sit to/from stand       Functional mobility during ADLs: Min guard;Rolling walker General ADL Comments: Educated on energy conservation techniques. Educated on safety tips for home (sitting for LB ADLs, rugs, safe shoewear, pets, use of basket on walker). Recommended family be with him for shower transfer and  to practice it with home health therapist prior to him doing it at home. Educated on deep breathing technique.  Recommended pt not pull on chair for toilet transfer as he reports doing at home, and explained he could use 3 in 1 but pt refusing. Educated on signs/symptoms of stroke. Discussed dressing technique for UB and use of button up shirts.     Vision                     Perception     Praxis      Pertinent Vitals/Pain Pain Assessment:  (No pain reported)     Hand Dominance     Extremity/Trunk Assessment Upper Extremity Assessment Upper Extremity Assessment: RUE deficits/detail;LUE deficits/detail RUE Deficits / Details: limited AROM shoulder flexion; gout in hands RUE Coordination: decreased fine motor LUE Deficits / Details: gout in hands LUE Coordination: decreased fine motor   Lower Extremity Assessment Lower Extremity Assessment: Defer to PT evaluation   Cervical / Trunk Assessment Cervical / Trunk Assessment: Kyphotic   Communication Communication Communication: Expressive difficulties   Cognition Arousal/Alertness: Awake/alert Behavior During Therapy: WFL for tasks assessed/performed Overall Cognitive Status: No family/caregiver present to determine baseline cognitive functioning                     General Comments       Exercises  Shoulder Instructions      Home Living Family/patient expects to be discharged to:: Private residence Living Arrangements: Parent Available Help at Discharge: Family;Available 24 hours/day Type of Home: House Home Access: Ramped entrance     Home Layout: One level     Bathroom Shower/Tub: Occupational psychologist: Handicapped height     Home Equipment: Environmental consultant - 2 wheels;Grab bars - tub/shower;Wheelchair - Biomedical scientist Comments: pt reports his house is handicap accessible       Prior Functioning/Environment Level of Independence: Independent with assistive  device(s)        Comments: ambulates with RW at all times due to chronic back pain    OT Diagnosis: Acute pain;Generalized weakness   OT Problem List: Decreased strength;Decreased activity tolerance;Decreased knowledge of use of DME or AE;Decreased knowledge of precautions;Decreased coordination;Decreased range of motion;Decreased safety awareness   OT Treatment/Interventions: Self-care/ADL training;DME and/or AE instruction;Therapeutic activities;Patient/family education;Balance training;Therapeutic exercise;Energy conservation    OT Goals(Current goals can be found in the care plan section) Acute Rehab OT Goals Patient Stated Goal: not stated OT Goal Formulation: With patient Time For Goal Achievement: 09/26/13 Potential to Achieve Goals: Good  OT Frequency: Min 2X/week   Barriers to D/C:            Co-evaluation              End of Session Equipment Utilized During Treatment: Gait belt;Rolling walker  Activity Tolerance: Patient tolerated treatment well Patient left: in bed;with call bell/phone within reach;with bed alarm set;with nursing/sitter in room   Time: 1333-1400 OT Time Calculation (min): 27 min Charges:  OT General Charges $OT Visit: 1 Procedure OT Evaluation $Initial OT Evaluation Tier I: 1 Procedure OT Treatments $Self Care/Home Management : 8-22 mins G-CodesBenito Mccreedy OTR/L [phone] 09/19/2013, 3:32 PM

## 2013-09-20 ENCOUNTER — Encounter (HOSPITAL_COMMUNITY): Payer: Self-pay | Admitting: Interventional Radiology

## 2013-10-18 ENCOUNTER — Other Ambulatory Visit (HOSPITAL_COMMUNITY): Payer: Self-pay | Admitting: Neurology

## 2013-11-11 ENCOUNTER — Other Ambulatory Visit: Payer: Self-pay | Admitting: Neurology

## 2014-01-04 ENCOUNTER — Other Ambulatory Visit: Payer: Self-pay | Admitting: Neurology

## 2014-01-04 NOTE — Telephone Encounter (Signed)
Called patient, got no answer.  Left message.

## 2014-01-10 NOTE — Telephone Encounter (Signed)
Called again, got no answer.  Left message.

## 2015-09-06 DIAGNOSIS — R079 Chest pain, unspecified: Secondary | ICD-10-CM

## 2015-09-06 DIAGNOSIS — G4733 Obstructive sleep apnea (adult) (pediatric): Secondary | ICD-10-CM

## 2015-09-06 DIAGNOSIS — I10 Essential (primary) hypertension: Secondary | ICD-10-CM | POA: Diagnosis not present

## 2015-09-06 DIAGNOSIS — I2510 Atherosclerotic heart disease of native coronary artery without angina pectoris: Secondary | ICD-10-CM | POA: Diagnosis not present

## 2015-09-07 DIAGNOSIS — R079 Chest pain, unspecified: Secondary | ICD-10-CM | POA: Diagnosis not present

## 2015-09-07 DIAGNOSIS — I2510 Atherosclerotic heart disease of native coronary artery without angina pectoris: Secondary | ICD-10-CM | POA: Diagnosis not present

## 2015-09-07 DIAGNOSIS — I10 Essential (primary) hypertension: Secondary | ICD-10-CM | POA: Diagnosis not present

## 2016-05-14 DIAGNOSIS — I2510 Atherosclerotic heart disease of native coronary artery without angina pectoris: Secondary | ICD-10-CM | POA: Diagnosis not present

## 2016-05-14 DIAGNOSIS — Z96619 Presence of unspecified artificial shoulder joint: Secondary | ICD-10-CM | POA: Diagnosis not present

## 2016-05-14 DIAGNOSIS — Z8739 Personal history of other diseases of the musculoskeletal system and connective tissue: Secondary | ICD-10-CM

## 2016-05-14 DIAGNOSIS — G4733 Obstructive sleep apnea (adult) (pediatric): Secondary | ICD-10-CM | POA: Diagnosis not present

## 2016-05-14 DIAGNOSIS — E784 Other hyperlipidemia: Secondary | ICD-10-CM | POA: Diagnosis not present

## 2016-05-14 DIAGNOSIS — I10 Essential (primary) hypertension: Secondary | ICD-10-CM | POA: Diagnosis not present

## 2016-05-14 DIAGNOSIS — K219 Gastro-esophageal reflux disease without esophagitis: Secondary | ICD-10-CM | POA: Diagnosis not present

## 2016-05-15 DIAGNOSIS — E784 Other hyperlipidemia: Secondary | ICD-10-CM | POA: Diagnosis not present

## 2016-05-15 DIAGNOSIS — Z8739 Personal history of other diseases of the musculoskeletal system and connective tissue: Secondary | ICD-10-CM | POA: Diagnosis not present

## 2016-05-15 DIAGNOSIS — G4733 Obstructive sleep apnea (adult) (pediatric): Secondary | ICD-10-CM | POA: Diagnosis not present

## 2016-05-15 DIAGNOSIS — I2510 Atherosclerotic heart disease of native coronary artery without angina pectoris: Secondary | ICD-10-CM | POA: Diagnosis not present

## 2016-05-15 DIAGNOSIS — Z96619 Presence of unspecified artificial shoulder joint: Secondary | ICD-10-CM | POA: Diagnosis not present

## 2016-05-15 DIAGNOSIS — K219 Gastro-esophageal reflux disease without esophagitis: Secondary | ICD-10-CM | POA: Diagnosis not present

## 2016-05-15 DIAGNOSIS — I10 Essential (primary) hypertension: Secondary | ICD-10-CM | POA: Diagnosis not present

## 2017-01-04 DIAGNOSIS — I10 Essential (primary) hypertension: Secondary | ICD-10-CM | POA: Diagnosis not present

## 2017-01-04 DIAGNOSIS — Z8739 Personal history of other diseases of the musculoskeletal system and connective tissue: Secondary | ICD-10-CM | POA: Diagnosis not present

## 2017-01-04 DIAGNOSIS — I2510 Atherosclerotic heart disease of native coronary artery without angina pectoris: Secondary | ICD-10-CM | POA: Diagnosis not present

## 2017-01-04 DIAGNOSIS — R079 Chest pain, unspecified: Secondary | ICD-10-CM

## 2017-01-04 DIAGNOSIS — E7849 Other hyperlipidemia: Secondary | ICD-10-CM | POA: Diagnosis not present

## 2017-01-05 DIAGNOSIS — R079 Chest pain, unspecified: Secondary | ICD-10-CM | POA: Diagnosis not present

## 2017-01-05 DIAGNOSIS — I2510 Atherosclerotic heart disease of native coronary artery without angina pectoris: Secondary | ICD-10-CM | POA: Diagnosis not present

## 2017-01-05 DIAGNOSIS — Z8739 Personal history of other diseases of the musculoskeletal system and connective tissue: Secondary | ICD-10-CM | POA: Diagnosis not present

## 2017-08-21 DIAGNOSIS — I10 Essential (primary) hypertension: Secondary | ICD-10-CM

## 2017-08-21 DIAGNOSIS — K219 Gastro-esophageal reflux disease without esophagitis: Secondary | ICD-10-CM | POA: Diagnosis not present

## 2017-08-21 DIAGNOSIS — I2510 Atherosclerotic heart disease of native coronary artery without angina pectoris: Secondary | ICD-10-CM | POA: Diagnosis not present

## 2017-08-21 DIAGNOSIS — Z8739 Personal history of other diseases of the musculoskeletal system and connective tissue: Secondary | ICD-10-CM | POA: Diagnosis not present

## 2017-08-21 DIAGNOSIS — L03115 Cellulitis of right lower limb: Secondary | ICD-10-CM | POA: Diagnosis not present

## 2017-08-21 DIAGNOSIS — G4733 Obstructive sleep apnea (adult) (pediatric): Secondary | ICD-10-CM

## 2017-08-22 DIAGNOSIS — L03115 Cellulitis of right lower limb: Secondary | ICD-10-CM | POA: Diagnosis not present

## 2019-03-30 ENCOUNTER — Other Ambulatory Visit: Payer: Self-pay

## 2019-03-30 ENCOUNTER — Ambulatory Visit (HOSPITAL_COMMUNITY): Admit: 2019-03-30 | Payer: Medicare Other | Admitting: Interventional Cardiology

## 2019-03-30 ENCOUNTER — Inpatient Hospital Stay (HOSPITAL_COMMUNITY): Payer: Medicare Other

## 2019-03-30 ENCOUNTER — Inpatient Hospital Stay (HOSPITAL_COMMUNITY)
Admission: EM | Admit: 2019-03-30 | Discharge: 2019-04-07 | DRG: 280 | Disposition: A | Discharge status: Home Health | Payer: Medicare Other | Attending: Internal Medicine | Admitting: Internal Medicine

## 2019-03-30 ENCOUNTER — Encounter (HOSPITAL_COMMUNITY)
Admission: EM | Disposition: A | Discharge status: Home Health | Payer: Self-pay | Source: Home / Self Care | Attending: Internal Medicine

## 2019-03-30 ENCOUNTER — Encounter (HOSPITAL_COMMUNITY): Payer: Self-pay

## 2019-03-30 ENCOUNTER — Emergency Department (HOSPITAL_COMMUNITY): Payer: Medicare Other

## 2019-03-30 DIAGNOSIS — M542 Cervicalgia: Secondary | ICD-10-CM | POA: Diagnosis not present

## 2019-03-30 DIAGNOSIS — R471 Dysarthria and anarthria: Secondary | ICD-10-CM | POA: Diagnosis present

## 2019-03-30 DIAGNOSIS — E1110 Type 2 diabetes mellitus with ketoacidosis without coma: Secondary | ICD-10-CM | POA: Diagnosis present

## 2019-03-30 DIAGNOSIS — I130 Hypertensive heart and chronic kidney disease with heart failure and stage 1 through stage 4 chronic kidney disease, or unspecified chronic kidney disease: Secondary | ICD-10-CM | POA: Diagnosis not present

## 2019-03-30 DIAGNOSIS — R110 Nausea: Secondary | ICD-10-CM | POA: Diagnosis not present

## 2019-03-30 DIAGNOSIS — Z6841 Body Mass Index (BMI) 40.0 and over, adult: Secondary | ICD-10-CM

## 2019-03-30 DIAGNOSIS — N179 Acute kidney failure, unspecified: Secondary | ICD-10-CM

## 2019-03-30 DIAGNOSIS — E1122 Type 2 diabetes mellitus with diabetic chronic kidney disease: Secondary | ICD-10-CM | POA: Diagnosis present

## 2019-03-30 DIAGNOSIS — R0602 Shortness of breath: Secondary | ICD-10-CM

## 2019-03-30 DIAGNOSIS — R609 Edema, unspecified: Secondary | ICD-10-CM | POA: Diagnosis not present

## 2019-03-30 DIAGNOSIS — N170 Acute kidney failure with tubular necrosis: Secondary | ICD-10-CM | POA: Diagnosis not present

## 2019-03-30 DIAGNOSIS — R1310 Dysphagia, unspecified: Secondary | ICD-10-CM

## 2019-03-30 DIAGNOSIS — M25561 Pain in right knee: Secondary | ICD-10-CM | POA: Diagnosis present

## 2019-03-30 DIAGNOSIS — Z86711 Personal history of pulmonary embolism: Secondary | ICD-10-CM

## 2019-03-30 DIAGNOSIS — J810 Acute pulmonary edema: Secondary | ICD-10-CM | POA: Diagnosis not present

## 2019-03-30 DIAGNOSIS — E875 Hyperkalemia: Secondary | ICD-10-CM | POA: Diagnosis present

## 2019-03-30 DIAGNOSIS — Z452 Encounter for adjustment and management of vascular access device: Secondary | ICD-10-CM

## 2019-03-30 DIAGNOSIS — N200 Calculus of kidney: Secondary | ICD-10-CM | POA: Diagnosis present

## 2019-03-30 DIAGNOSIS — R739 Hyperglycemia, unspecified: Secondary | ICD-10-CM | POA: Diagnosis present

## 2019-03-30 DIAGNOSIS — G92 Toxic encephalopathy: Secondary | ICD-10-CM | POA: Diagnosis not present

## 2019-03-30 DIAGNOSIS — R112 Nausea with vomiting, unspecified: Secondary | ICD-10-CM

## 2019-03-30 DIAGNOSIS — L97519 Non-pressure chronic ulcer of other part of right foot with unspecified severity: Secondary | ICD-10-CM | POA: Diagnosis present

## 2019-03-30 DIAGNOSIS — N183 Chronic kidney disease, stage 3 unspecified: Secondary | ICD-10-CM | POA: Diagnosis present

## 2019-03-30 DIAGNOSIS — I2510 Atherosclerotic heart disease of native coronary artery without angina pectoris: Secondary | ICD-10-CM | POA: Diagnosis present

## 2019-03-30 DIAGNOSIS — M109 Gout, unspecified: Secondary | ICD-10-CM | POA: Diagnosis present

## 2019-03-30 DIAGNOSIS — Z7982 Long term (current) use of aspirin: Secondary | ICD-10-CM

## 2019-03-30 DIAGNOSIS — I2119 ST elevation (STEMI) myocardial infarction involving other coronary artery of inferior wall: Secondary | ICD-10-CM | POA: Diagnosis not present

## 2019-03-30 DIAGNOSIS — R079 Chest pain, unspecified: Secondary | ICD-10-CM | POA: Diagnosis not present

## 2019-03-30 DIAGNOSIS — L8990 Pressure ulcer of unspecified site, unspecified stage: Secondary | ICD-10-CM | POA: Insufficient documentation

## 2019-03-30 DIAGNOSIS — E662 Morbid (severe) obesity with alveolar hypoventilation: Secondary | ICD-10-CM | POA: Diagnosis present

## 2019-03-30 DIAGNOSIS — J9601 Acute respiratory failure with hypoxia: Secondary | ICD-10-CM | POA: Diagnosis not present

## 2019-03-30 DIAGNOSIS — Z79891 Long term (current) use of opiate analgesic: Secondary | ICD-10-CM

## 2019-03-30 DIAGNOSIS — Z955 Presence of coronary angioplasty implant and graft: Secondary | ICD-10-CM

## 2019-03-30 DIAGNOSIS — I5022 Chronic systolic (congestive) heart failure: Secondary | ICD-10-CM | POA: Diagnosis present

## 2019-03-30 DIAGNOSIS — E785 Hyperlipidemia, unspecified: Secondary | ICD-10-CM | POA: Diagnosis present

## 2019-03-30 DIAGNOSIS — R1314 Dysphagia, pharyngoesophageal phase: Secondary | ICD-10-CM | POA: Diagnosis not present

## 2019-03-30 DIAGNOSIS — H9319 Tinnitus, unspecified ear: Secondary | ICD-10-CM | POA: Diagnosis present

## 2019-03-30 DIAGNOSIS — N1831 Chronic kidney disease, stage 3a: Secondary | ICD-10-CM | POA: Diagnosis not present

## 2019-03-30 DIAGNOSIS — Q250 Patent ductus arteriosus: Secondary | ICD-10-CM | POA: Diagnosis not present

## 2019-03-30 DIAGNOSIS — E871 Hypo-osmolality and hyponatremia: Secondary | ICD-10-CM | POA: Diagnosis not present

## 2019-03-30 DIAGNOSIS — Z8711 Personal history of peptic ulcer disease: Secondary | ICD-10-CM

## 2019-03-30 DIAGNOSIS — Z8673 Personal history of transient ischemic attack (TIA), and cerebral infarction without residual deficits: Secondary | ICD-10-CM

## 2019-03-30 DIAGNOSIS — B379 Candidiasis, unspecified: Secondary | ICD-10-CM | POA: Diagnosis not present

## 2019-03-30 DIAGNOSIS — Z20822 Contact with and (suspected) exposure to covid-19: Secondary | ICD-10-CM | POA: Diagnosis present

## 2019-03-30 DIAGNOSIS — M25569 Pain in unspecified knee: Secondary | ICD-10-CM

## 2019-03-30 DIAGNOSIS — Z79899 Other long term (current) drug therapy: Secondary | ICD-10-CM

## 2019-03-30 DIAGNOSIS — I472 Ventricular tachycardia: Secondary | ICD-10-CM | POA: Diagnosis not present

## 2019-03-30 DIAGNOSIS — I252 Old myocardial infarction: Secondary | ICD-10-CM

## 2019-03-30 DIAGNOSIS — L89612 Pressure ulcer of right heel, stage 2: Secondary | ICD-10-CM | POA: Diagnosis present

## 2019-03-30 HISTORY — DX: Monoclonal gammopathy: D47.2

## 2019-03-30 HISTORY — DX: Cerebral infarction, unspecified: I63.9

## 2019-03-30 HISTORY — DX: Chronic kidney disease, stage 3 unspecified: N18.30

## 2019-03-30 HISTORY — DX: Obstructive sleep apnea (adult) (pediatric): G47.33

## 2019-03-30 LAB — COMPREHENSIVE METABOLIC PANEL
ALT: 37 U/L (ref 0–44)
AST: 40 U/L (ref 15–41)
Albumin: 3 g/dL — ABNORMAL LOW (ref 3.5–5.0)
Alkaline Phosphatase: 224 U/L — ABNORMAL HIGH (ref 38–126)
Anion gap: 18 — ABNORMAL HIGH (ref 5–15)
BUN: 101 mg/dL — ABNORMAL HIGH (ref 6–20)
CO2: 19 mmol/L — ABNORMAL LOW (ref 22–32)
Calcium: 8.6 mg/dL — ABNORMAL LOW (ref 8.9–10.3)
Chloride: 83 mmol/L — ABNORMAL LOW (ref 98–111)
Creatinine, Ser: 3.82 mg/dL — ABNORMAL HIGH (ref 0.61–1.24)
GFR calc Af Amer: 19 mL/min — ABNORMAL LOW (ref >60)
GFR calc non Af Amer: 17 mL/min — ABNORMAL LOW (ref >60)
Glucose, Bld: 857 mg/dL (ref 70–99)
Potassium: >7.5 mmol/L (ref 3.5–5.1)
Sodium: 120 mmol/L — ABNORMAL LOW (ref 135–145)
Total Bilirubin: 0.9 mg/dL (ref 0.3–1.2)
Total Protein: 7.4 g/dL (ref 6.5–8.1)

## 2019-03-30 LAB — POCT I-STAT 7, (LYTES, BLD GAS, ICA,H+H)
Bicarbonate: 25.9 mmol/L (ref 20.0–28.0)
Calcium, Ion: 1.3 mmol/L (ref 1.15–1.40)
HCT: 46 % (ref 39.0–52.0)
Hemoglobin: 15.6 g/dL (ref 13.0–17.0)
O2 Saturation: 96 %
Patient temperature: 97.2
Potassium: 6.2 mmol/L — ABNORMAL HIGH (ref 3.5–5.1)
Sodium: 128 mmol/L — ABNORMAL LOW (ref 135–145)
TCO2: 27 mmol/L (ref 22–32)
pCO2 arterial: 44.1 mmHg (ref 32.0–48.0)
pH, Arterial: 7.374 (ref 7.350–7.450)
pO2, Arterial: 83 mmHg (ref 83.0–108.0)

## 2019-03-30 LAB — BASIC METABOLIC PANEL
Anion gap: 16 — ABNORMAL HIGH (ref 5–15)
Anion gap: 13 (ref 5–15)
BUN: 96 mg/dL — ABNORMAL HIGH (ref 6–20)
BUN: 91 mg/dL — ABNORMAL HIGH (ref 6–20)
CO2: 23 mmol/L (ref 22–32)
CO2: 22 mmol/L (ref 22–32)
Calcium: 9.7 mg/dL (ref 8.9–10.3)
Calcium: 8.7 mg/dL — ABNORMAL LOW (ref 8.9–10.3)
Chloride: 91 mmol/L — ABNORMAL LOW (ref 98–111)
Chloride: 96 mmol/L — ABNORMAL LOW (ref 98–111)
Creatinine, Ser: 3.18 mg/dL — ABNORMAL HIGH (ref 0.61–1.24)
Creatinine, Ser: 2.66 mg/dL — ABNORMAL HIGH (ref 0.61–1.24)
GFR calc Af Amer: 24 mL/min — ABNORMAL LOW (ref >60)
GFR calc Af Amer: 30 mL/min — ABNORMAL LOW (ref >60)
GFR calc non Af Amer: 21 mL/min — ABNORMAL LOW (ref >60)
GFR calc non Af Amer: 26 mL/min — ABNORMAL LOW (ref >60)
Glucose, Bld: 531 mg/dL (ref 70–99)
Glucose, Bld: 261 mg/dL — ABNORMAL HIGH (ref 70–99)
Potassium: 6.3 mmol/L (ref 3.5–5.1)
Potassium: 5.6 mmol/L — ABNORMAL HIGH (ref 3.5–5.1)
Sodium: 130 mmol/L — ABNORMAL LOW (ref 135–145)
Sodium: 131 mmol/L — ABNORMAL LOW (ref 135–145)

## 2019-03-30 LAB — GLUCOSE, CAPILLARY
Glucose-Capillary: >600 mg/dL (ref 70–99)
Glucose-Capillary: 551 mg/dL (ref 70–99)
Glucose-Capillary: 505 mg/dL (ref 70–99)
Glucose-Capillary: 428 mg/dL — ABNORMAL HIGH (ref 70–99)
Glucose-Capillary: 432 mg/dL — ABNORMAL HIGH (ref 70–99)
Glucose-Capillary: 367 mg/dL — ABNORMAL HIGH (ref 70–99)
Glucose-Capillary: 246 mg/dL — ABNORMAL HIGH (ref 70–99)
Glucose-Capillary: 153 mg/dL — ABNORMAL HIGH (ref 70–99)

## 2019-03-30 LAB — POCT I-STAT EG7
Acid-base deficit: 3 mmol/L — ABNORMAL HIGH (ref 0.0–2.0)
Bicarbonate: 22 mmol/L (ref 20.0–28.0)
Calcium, Ion: 1.04 mmol/L — ABNORMAL LOW (ref 1.15–1.40)
HCT: 48 % (ref 39.0–52.0)
Hemoglobin: 16.3 g/dL (ref 13.0–17.0)
O2 Saturation: 65 %
Potassium: 7.9 mmol/L (ref 3.5–5.1)
Sodium: 118 mmol/L — CL (ref 135–145)
TCO2: 23 mmol/L (ref 22–32)
pCO2, Ven: 39.5 mmHg — ABNORMAL LOW (ref 44.0–60.0)
pH, Ven: 7.354 (ref 7.250–7.430)
pO2, Ven: 35 mmHg (ref 32.0–45.0)

## 2019-03-30 LAB — CBC WITH DIFFERENTIAL/PLATELET
Abs Immature Granulocytes: 0.6 10*3/uL — ABNORMAL HIGH (ref 0.00–0.07)
Basophils Absolute: 0 10*3/uL (ref 0.0–0.1)
Basophils Relative: 0 %
Eosinophils Absolute: 0 10*3/uL (ref 0.0–0.5)
Eosinophils Relative: 0 %
HCT: 45.7 % (ref 39.0–52.0)
Hemoglobin: 15.2 g/dL (ref 13.0–17.0)
Lymphocytes Relative: 6 %
Lymphs Abs: 1.7 10*3/uL (ref 0.7–4.0)
MCH: 30.4 pg (ref 26.0–34.0)
MCHC: 33.3 g/dL (ref 30.0–36.0)
MCV: 91.4 fL (ref 80.0–100.0)
Metamyelocytes Relative: 2 %
Monocytes Absolute: 0.9 10*3/uL (ref 0.1–1.0)
Monocytes Relative: 3 %
Neutro Abs: 25.5 10*3/uL — ABNORMAL HIGH (ref 1.7–7.7)
Neutrophils Relative %: 89 %
Platelets: 454 10*3/uL — ABNORMAL HIGH (ref 150–400)
RBC: 5 MIL/uL (ref 4.22–5.81)
RDW: 12.3 % (ref 11.5–15.5)
WBC: 28.6 10*3/uL — ABNORMAL HIGH (ref 4.0–10.5)
nRBC: 0 % (ref 0.0–0.2)
nRBC: 0 /100 WBC

## 2019-03-30 LAB — I-STAT CHEM 8, ED
BUN: 102 mg/dL — ABNORMAL HIGH (ref 6–20)
Calcium, Ion: 1.04 mmol/L — ABNORMAL LOW (ref 1.15–1.40)
Chloride: 90 mmol/L — ABNORMAL LOW (ref 98–111)
Creatinine, Ser: 3.5 mg/dL — ABNORMAL HIGH (ref 0.61–1.24)
Glucose, Bld: >700 mg/dL (ref 70–99)
HCT: 49 % (ref 39.0–52.0)
Hemoglobin: 16.7 g/dL (ref 13.0–17.0)
Potassium: 8 mmol/L (ref 3.5–5.1)
Sodium: 118 mmol/L — CL (ref 135–145)
TCO2: 22 mmol/L (ref 22–32)

## 2019-03-30 LAB — TROPONIN I (HIGH SENSITIVITY)
Troponin I (High Sensitivity): 2304 ng/L
Troponin I (High Sensitivity): 4413 ng/L (ref <18)
Troponin I (High Sensitivity): 5272 ng/L (ref <18)

## 2019-03-30 LAB — CBG MONITORING, ED: Glucose-Capillary: >600 mg/dL (ref 70–99)

## 2019-03-30 LAB — AMMONIA: Ammonia: 31 umol/L (ref 9–35)

## 2019-03-30 LAB — HIV ANTIBODY (ROUTINE TESTING W REFLEX): HIV Screen 4th Generation wRfx: NONREACTIVE

## 2019-03-30 LAB — MAGNESIUM: Magnesium: 2.6 mg/dL — ABNORMAL HIGH (ref 1.7–2.4)

## 2019-03-30 LAB — LACTIC ACID, PLASMA
Lactic Acid, Venous: 3.4 mmol/L (ref 0.5–1.9)
Lactic Acid, Venous: 2.8 mmol/L (ref 0.5–1.9)

## 2019-03-30 LAB — RESPIRATORY PANEL BY RT PCR (FLU A&B, COVID)
Influenza A by PCR: NEGATIVE
Influenza B by PCR: NEGATIVE
SARS Coronavirus 2 by RT PCR: NEGATIVE

## 2019-03-30 LAB — PHOSPHORUS: Phosphorus: 4.9 mg/dL — ABNORMAL HIGH (ref 2.5–4.6)

## 2019-03-30 LAB — LIPASE, BLOOD: Lipase: 40 U/L (ref 11–51)

## 2019-03-30 LAB — PROCALCITONIN: Procalcitonin: 0.46 ng/mL

## 2019-03-30 LAB — BRAIN NATRIURETIC PEPTIDE
B Natriuretic Peptide: 88.7 pg/mL (ref 0.0–100.0)
B Natriuretic Peptide: 549.1 pg/mL — ABNORMAL HIGH (ref 0.0–100.0)

## 2019-03-30 LAB — MRSA PCR SCREENING: MRSA by PCR: POSITIVE — AB

## 2019-03-30 SURGERY — LEFT HEART CATH AND CORONARY ANGIOGRAPHY
Anesthesia: LOCAL

## 2019-03-30 MED ORDER — DEXTROSE-NACL 5-0.45 % IV SOLN: INTRAVENOUS | Status: DC

## 2019-03-30 MED ORDER — SODIUM CHLORIDE 0.9 % IV SOLN
250.0000 [IU]/h | INTRAVENOUS | Status: DC
  Administered 2019-03-30: 250 [IU]/h via INTRAVENOUS_CENTRAL
  Filled 2019-03-30: qty 2

## 2019-03-30 MED ORDER — METRONIDAZOLE IN NACL 5-0.79 MG/ML-% IV SOLN
500.0000 mg | Freq: Once | INTRAVENOUS | Status: AC
  Administered 2019-03-30: 500 mg via INTRAVENOUS
  Filled 2019-03-30: qty 100

## 2019-03-30 MED ORDER — HEPARIN SODIUM (PORCINE) 5000 UNIT/ML IJ SOLN
5000.0000 [IU] | Freq: Three times a day (TID) | INTRAMUSCULAR | Status: DC
  Administered 2019-03-30 – 2019-03-31 (×3): 5000 [IU] via SUBCUTANEOUS
  Filled 2019-03-30 (×3): qty 1

## 2019-03-30 MED ORDER — FUROSEMIDE 10 MG/ML IJ SOLN
60.0000 mg | Freq: Once | INTRAMUSCULAR | Status: AC
  Administered 2019-03-30: 60 mg via INTRAVENOUS
  Filled 2019-03-30: qty 6

## 2019-03-30 MED ORDER — SODIUM CHLORIDE 0.9 % IV SOLN
2.0000 g | Freq: Two times a day (BID) | INTRAVENOUS | Status: DC
  Administered 2019-03-30 – 2019-03-31 (×3): 2 g via INTRAVENOUS
  Filled 2019-03-30 (×3): qty 2

## 2019-03-30 MED ORDER — VANCOMYCIN HCL 10 G IV SOLR
2500.0000 mg | Freq: Once | INTRAVENOUS | Status: DC
  Filled 2019-03-30: qty 2500

## 2019-03-30 MED ORDER — MUPIROCIN 2 % EX OINT
1.0000 "application " | TOPICAL_OINTMENT | Freq: Two times a day (BID) | CUTANEOUS | Status: AC
  Administered 2019-03-30 – 2019-04-04 (×10): 1 via NASAL
  Filled 2019-03-30 (×2): qty 22

## 2019-03-30 MED ORDER — CALCIUM GLUCONATE-NACL 1-0.675 GM/50ML-% IV SOLN
1.0000 g | Freq: Once | INTRAVENOUS | Status: AC
  Administered 2019-03-30: 1000 mg via INTRAVENOUS
  Filled 2019-03-30: qty 50

## 2019-03-30 MED ORDER — VANCOMYCIN HCL 1500 MG/300ML IV SOLN: 1500.0000 mg | INTRAVENOUS | Status: DC

## 2019-03-30 MED ORDER — INSULIN REGULAR(HUMAN) IN NACL 100-0.9 UT/100ML-% IV SOLN
INTRAVENOUS | Status: DC
  Administered 2019-03-30: 11.5 [IU]/h via INTRAVENOUS
  Administered 2019-03-31: 8 [IU]/h via INTRAVENOUS
  Filled 2019-03-30 (×3): qty 100

## 2019-03-30 MED ORDER — HEPARIN BOLUS VIA INFUSION (CRRT)
1000.0000 [IU] | INTRAVENOUS | Status: DC | PRN
  Filled 2019-03-30: qty 1000

## 2019-03-30 MED ORDER — CHLORHEXIDINE GLUCONATE CLOTH 2 % EX PADS
6.0000 | MEDICATED_PAD | Freq: Every day | CUTANEOUS | Status: DC
  Administered 2019-03-31 – 2019-04-03 (×4): 6 via TOPICAL

## 2019-03-30 MED ORDER — ALTEPLASE 2 MG IJ SOLR
4.0000 mg | INTRAMUSCULAR | Status: AC
  Filled 2019-03-30 (×2): qty 4

## 2019-03-30 MED ORDER — ALTEPLASE 2 MG IJ SOLR
2.0000 mg | Freq: Once | INTRAMUSCULAR | Status: AC
  Administered 2019-03-30: 2 mg
  Filled 2019-03-30 (×2): qty 2

## 2019-03-30 MED ORDER — SODIUM ZIRCONIUM CYCLOSILICATE 5 G PO PACK
10.0000 g | PACK | Freq: Every day | ORAL | Status: DC
  Administered 2019-03-31: 10 g via ORAL
  Filled 2019-03-30: qty 2

## 2019-03-30 MED ORDER — SODIUM CHLORIDE 0.9 % IV SOLN
INTRAVENOUS | Status: DC
  Administered 2019-03-30: 75 mL via INTRAVENOUS

## 2019-03-30 MED ORDER — VANCOMYCIN HCL IN DEXTROSE 1-5 GM/200ML-% IV SOLN: 1000.0000 mg | Freq: Once | INTRAVENOUS | Status: DC

## 2019-03-30 MED ORDER — SODIUM BICARBONATE 8.4 % IV SOLN
INTRAVENOUS | Status: AC | PRN
  Administered 2019-03-30: 50 meq via INTRAVENOUS

## 2019-03-30 MED ORDER — DEXTROSE 50 % IV SOLN: 0.0000 mL | INTRAVENOUS | Status: DC | PRN

## 2019-03-30 MED ORDER — SODIUM CHLORIDE 0.9 % IV SOLN: 2.0000 g | Freq: Once | INTRAVENOUS | Status: DC

## 2019-03-30 MED ORDER — VERAPAMIL HCL 2.5 MG/ML IV SOLN
INTRAVENOUS | Status: AC
  Filled 2019-03-30: qty 2

## 2019-03-30 MED ORDER — NITROGLYCERIN 1 MG/10 ML FOR IR/CATH LAB
INTRA_ARTERIAL | Status: AC
  Filled 2019-03-30: qty 10

## 2019-03-30 MED ORDER — SODIUM BICARBONATE 8.4 % IV SOLN
50.0000 meq | Freq: Once | INTRAVENOUS | Status: AC
  Administered 2019-03-30: 50 meq via INTRAVENOUS
  Filled 2019-03-30: qty 50

## 2019-03-30 MED ORDER — CALCIUM CHLORIDE 10 % IV SOLN
INTRAVENOUS | Status: AC | PRN
  Administered 2019-03-30: 1000 mg via INTRAVENOUS

## 2019-03-30 MED ORDER — HEPARIN SODIUM (PORCINE) 1000 UNIT/ML DIALYSIS
1000.0000 [IU] | INTRAMUSCULAR | Status: DC | PRN
  Filled 2019-03-30: qty 6
  Filled 2019-03-30: qty 3

## 2019-03-30 MED ORDER — PRISMASOL BGK 0/2.5 32-2.5 MEQ/L IV SOLN
INTRAVENOUS | Status: DC
  Filled 2019-03-30 (×10): qty 5000

## 2019-03-30 MED ORDER — INSULIN ASPART 100 UNIT/ML IV SOLN
10.0000 [IU] | Freq: Once | INTRAVENOUS | Status: AC
  Administered 2019-03-30: 10 [IU] via INTRAVENOUS

## 2019-03-30 MED ORDER — SODIUM CHLORIDE 0.9 % IV SOLN: 1.0000 g | Freq: Once | INTRAVENOUS | Status: DC

## 2019-03-30 MED ORDER — SODIUM ZIRCONIUM CYCLOSILICATE 5 G PO PACK
5.0000 g | PACK | Freq: Every day | ORAL | Status: DC
  Filled 2019-03-30: qty 1

## 2019-03-30 MED ORDER — PRISMASOL BGK 0/2.5 32-2.5 MEQ/L REPLACEMENT SOLN
Status: DC
  Filled 2019-03-30 (×2): qty 5000

## 2019-03-30 MED ORDER — PRISMASOL BGK 0/2.5 32-2.5 MEQ/L REPLACEMENT SOLN
Status: DC
  Filled 2019-03-30 (×3): qty 5000

## 2019-03-30 NOTE — ED Triage Notes (Signed)
Pt BIB [facility] for eval of SOB, AMS, weakness and malaise. Pt's mother reports gradual decline and weakness x 4 days, and acute SOB while having BM today. Hx of COPD. En route, EMS activated as Code STEMI d/t inferior ischemic changes and chest pain. Cancelled on arrival by MD [facility] d/t patient instability and need for further diagnostic w/u before taking to cath lab.

## 2019-03-30 NOTE — Progress Notes (Signed)
PCCM INTERVAL PROGRESS NOTE  Called to bedside to evaluate poorly functioning dialysis catheter. Briefly this is a 54 year old male admitted with acute renal failure and hyperkalemia up to 8.5. He has been seem by nephrology who have placed orders for CRRT. Catheter was placed around 1630. CXR shows good placement without complication. CRRT has not yet been started. Line was not heparinized. RN is reporting positional line. Red port flushes and draws well. Blue port flushes well, but is unable to pull back.   I have taken down the dressing and retracted the line 2 cm under sterile conditions. Still unable to pull back from blue port. Seeing as line was not heparinized and it has been several hours. We will try to intra-catheter TPA. In the mean time we will try him with diuresis and a foley for accurate I&O. If this fails we will need to unfortunately place a new line.    Jesus Cruz, AGACNP-BC [place]  See Amion for personal pager PCCM on call pager [phone]  03/30/2019 10:34 PM

## 2019-03-30 NOTE — Consult Note (Addendum)
Cardiology Consultation:    The patient has been seen in conjunction with Rosaria Ferries, PA-C. All aspects of care have been considered and discussed. The patient has been personally interviewed, examined, and all clinical data has been reviewed.   We responded to STEMI activation by [facility] in a patient who for 3 days that been feeling poorly, decreased appetite, and who began having chest pressure and dyspnea 18 hours prior to admission.  Initial EKG performed demonstrated ST elevation in limb lead III with peaked T waves and all the precordial leads.  T wave abnormality on 12-lead EKG suggested hyperkalemia.  Further information obtained about the patient indicated prior PCI, stage IV/V chronic kidney disease, morbid obesity, and decreased level of consciousness.  On exam the patient is lethargic.  Says it is pressure on his chest.  He is also very dyspneic.  Unable to engage neck pain distention.  Breath sounds are diminished li.  Cardiac tones are distant.  No obvious murmur is heard.  Marked bilateral lower extremity edema from the dorsum of the feet to the knees.  Repeat EKGs demonstrate persistent ST elevation in lead III, persistent peaked T waves (question hyperkalemia), and intermittent left bundle branch block.  IMPRESSION: The patient is critically ill and in congestive heart failure progressive all day, with known stage IV or greater chronic kidney disease probable hyperkalemia (based on EKG), and possibly a superimposed acute coronary syndrome.  Also have concern for the possibility of CO2 retention to account for altered mental status and possible diabetic emergency either DKA or hyperosmolar state.  RECOMMENDATION: He will not be taken immediately to the Cath Lab.  STEMI activation will be canceled.  Additional data needs to be obtained including a PCO2, potassium level, kidney function, chest x-ray, and other data as needed to fully define clinical status.  Cardiac  markers including troponin I and BNP as well as electrocardiogram should be repeated.  Critical care time 40 minutes    Patient ID: Jesus Cruz; [id number]; December 11, 1965   Admit date: 03/30/2019 Date of Consult: 03/30/2019  Primary Care Provider: Garvin Fila, MD Primary Cardiologist: Dr. Almira Coaster 11/27/2017 in [place] Dr. Geraldo Pitter, 2017 Primary Electrophysiologist:  None   Patient Profile:   Jesus Cruz is a 54 y.o. male with a hx of CAD s/p inf STEMI w/ Integrity BMS RCA 04/2012, DOE with negative CT-PE protocol, normal EF on echo, probable OHS, HTN, obesity, CVA, OSA on CPAP, Hx PE 05/2012 (completed coumadin), gout, MGUS, COPD, who is being seen today for the evaluation of possible STEMI at the request of Dr Laverta Baltimore.  Of note, ECG from 2019 had QRS duration 84 ms  History of Present Illness:   [name] is reported to have been generally declining over the last few days.  The family reported no specific complaints.  Today, EMS was called because of shortness of breath.  On EMS arrival, the patient was complaining of shortness of breath.  He was hypoxic and was given oxygen.  His oxygen saturation got up to 90%.  He seemed lethargic.  His ECG was concerning for hyperkalemia.  He was being transported to the hospital.  In route to the hospital, his complexes changed and became wider and more bizarre.  At that point, he began complaining of chest pain.  He was given aspirin, but his blood pressure was borderline so he was not given nitroglycerin.  Upon arrival to the hospital, he is still complaining of 10 out of 10 chest  pain.  He is lethargic and has trouble answering questions, although his answers are appropriate.  He also complains of shortness of breath.  He is very weak.  He is not diaphoretic, there is no nausea or vomiting.  He says the chest pain reminds him of his previous MI.   Past Medical History:  Diagnosis Date  . CVA (cerebral vascular accident) ([place])   .  Gout   . Hypertension   . MGUS (monoclonal gammopathy of unknown significance)   . Obesity   . OSA on CPAP   . Pulmonary embolus ([place]) 05/2012   During recovery from MI, completed Coumadin  . STEMI (ST elevation myocardial infarction) ([place]) 04/2012   Integrity BMS RCA with minor left system disease    Past Surgical History:  Procedure Laterality Date  . CORONARY ANGIOPLASTY WITH STENT PLACEMENT  04/2012   Integrity BMS RCA, minor L system dz  . RADIOLOGY WITH ANESTHESIA N/A 09/16/2013   Procedure: RADIOLOGY WITH ANESTHESIA;  Surgeon: Rob Hickman, MD;  Location: [facility];  Service: Radiology;  Laterality: N/A;     Prior to Admission medications   Medication Sig Start Date End Date Taking? Authorizing Provider  allopurinol (ZYLOPRIM) 300 MG tablet Take 300 mg by mouth daily.    [provider]  amLODipine (NORVASC) 10 MG tablet Take 10 mg by mouth daily.    [provider]  aspirin EC 325 MG tablet TAKE 1 TABLET BY MOUTH EVERY DAY 11/14/13   Rosalin Hawking, MD  colchicine 0.6 MG tablet Take 0.6 mg by mouth daily.    [provider]  HYDROcodone-acetaminophen (NORCO) 10-325 MG per tablet Take 1 tablet by mouth every 6 (six) hours as needed (for pain).     [provider]  metoprolol succinate (TOPROL-XL) 100 MG 24 hr tablet Take 300 mg by mouth daily. Take with or immediately following a meal.    [provider]  nitroGLYCERIN (NITROSTAT) 0.4 MG SL tablet Place 0.4 mg under the tongue every 5 (five) minutes as needed for chest pain.    [provider]  OxyCODONE (OXYCONTIN) 20 mg T12A 12 hr tablet Take 20 mg by mouth every 12 (twelve) hours.    [provider]  pregabalin (LYRICA) 150 MG capsule Take 150 mg by mouth 2 (two) times daily.    [provider]    Inpatient Medications: Scheduled Meds: . sodium zirconium cyclosilicate  5 g Oral Daily   Continuous Infusions:  PRN Meds:   Allergies:   No Known  Allergies  Social History:   Social History   Socioeconomic History  . Marital status: Single    Spouse name: Not on file  . Number of children: Not on file  . Years of education: Not on file  . Highest education level: Not on file  Occupational History  . Not on file  Tobacco Use  . Smoking status: Never Smoker  . Smokeless tobacco: Never Used  Substance and Sexual Activity  . Alcohol use: No  . Drug use: Not on file  . Sexual activity: Never  Other Topics Concern  . Not on file  Social History Narrative  . Not on file   Social Determinants of Health   Financial Resource Strain:   . Difficulty of Paying Living Expenses:   Food Insecurity:   . Worried About Charity fundraiser in the Last Year:   . Arboriculturist in the Last Year:   Transportation Needs:   .  Lack of Transportation (Medical):   Marland Kitchen Lack of Transportation (Non-Medical):   Physical Activity:   . Days of Exercise per Week:   . Minutes of Exercise per Session:   Stress:   . Feeling of Stress :   Social Connections:   . Frequency of Communication with Friends and Family:   . Frequency of Social Gatherings with Friends and Family:   . Attends Religious Services:   . Active Member of Clubs or Organizations:   . Attends Archivist Meetings:   Marland Kitchen Marital Status:   Intimate Partner Violence:   . Fear of Current or Ex-Partner:   . Emotionally Abused:   Marland Kitchen Physically Abused:   . Sexually Abused:     Family History:   History reviewed. No pertinent family history. Family Status:  Family Status  Relation Name Status  . Mother  Deceased  . Father  Deceased    ROS:  Please see the history of present illness.  All other ROS reviewed and negative.     Physical Exam/Data:   Vitals:   03/30/19 1447 03/30/19 1500 03/30/19 1509 03/30/19 1513  BP: 116/80 128/90    Pulse: (!) 107 (!) 108    Resp: (!) 24 (!) 24    Temp: 98.4 F (36.9 C)     TempSrc: Oral     SpO2: 91% (!) 89% 92%   Weight:     (!) 140 kg  Height:    5\' 7"  (1.702 m)   No intake or output data in the 24 hours ending 03/30/19 1545  Last 3 Weights 03/30/2019 09/17/2013 09/16/2013  Weight (lbs) 308 lb 10.3 oz 277 lb 14.4 oz 287 lb 4.2 oz  Weight (kg) 140 kg 126.055 kg 130.3 kg     Body mass index is 48.34 kg/m.   General:  Well nourished, well developed, male in no acute distress HEENT: normal Lymph: no adenopathy Neck: JVD -not seen elevated, difficult to assess secondary to body habitus Endocrine:  No thryomegaly Vascular: No carotid bruits; 4/4 extremity pulses 2+  Cardiac:  normal S1, S2; RRR; no murmur Lungs:  clear bilaterally, no wheezing, rhonchi or rales  Abd: soft, nontender, no hepatomegaly  Ext: 1-2+ edema Musculoskeletal:  No deformities, BUE and BLE strength normal and equal Skin: Cool and dry  Neuro:  CNs 2-12 intact, no focal abnormalities noted Psych:  Normal affect   EKG:  The EKG was personally reviewed and demonstrates: atrial tach vs A fib, heart rate 105, right bundle not seen on 2019 ECG Telemetry:  Telemetry was personally reviewed and demonstrates: atrial fib vs Atrial tach   CV studies:   ECHO: 07/02/2017 Findings Mitral Valve Normal mitral valve structure and mobility. No significant regurgitation. Aortic Valve Normal tricuspid aortic valve with pliable leaflets, no stenosis or insufficiency. Tricuspid Valve Structurally normal tricuspid valve with no stenosis. Pulmonic Valve The pulmonic valve was not well visualized No evidence of pulmonic stenosis. Left Atrium Normal left atrium. Left Ventricle Ejection fraction is visually estimated at 60-65% Moderate left ventricular hypertrophy normal dialstolic function basal inferoseptal hypokinesis. Right Atrium Normal right atrium. Right Ventricle Normal right ventricle structure and function. Pericardial Effusion No evidence of pericardial effusion. Fat pad present. Pleural Effusion No evidence of pleural  effusion. Miscellaneous The aortic root diameter is within normal limits. IVC not visualized. M-Mode/2D Measurements & Calculations  LV Diastolic Dimension: LV Systolic Dimension:  LA Dimension: 3 cmAO Root  4.79 cm         3.16  cm         Dimension: 3.9 cmLA Area:  LV FS:34.03 %      LV Volume Diastolic: [id number] cm2  LV PW Diastolic: [id number] cm ml  Septum Diastolic: 0000000  LV Volume Systolic: [id number]  cm            ml              LV EDV/LV EDV Index: 110              ml/43 m2LV ESV/LV ESV  LA/Aorta: 0.77              Index: 31.6 ml/12 m2              EF Calculated: 71.27 %  LA volume/Index: 47.9 ml                           /73m2              LVOT: 2 cm Doppler Measurements & Calculations  MV Peak E-Wave: 80.5 cm/s  AV Peak Velocity: 122  LVOT Peak Velocity: 91.7  MV Peak A-Wave: 82.9 cm/s  cm/s          cm/s  MV E/A Ratio: 0.97     AV Peak Gradient: 5.95 LVOT Peak Gradient: 3  MV Peak Gradient: 2.59 mmHg mmHg          mmHg  MV Deceleration Time: 194  msec  MV P1/2t: 57 msec  MVA by PHT3.86 cm2                            PV Peak Velocity: 67.9  TDI E' Velocity: 4.35 cm/s             cm/s                            PV Peak Gradient: 1.84                            mmHg  CATH: no records available   Laboratory Data:   Chemistry Recent Labs  Lab 03/30/19 1456  NA 118*  118*  K 8.0*  7.9*  CL 90*  GLUCOSE >700*  BUN 102*  CREATININE 3.50*    No results found for: ALT, AST, GGT, ALKPHOS, BILITOT Hematology Recent Labs  Lab 03/30/19 1451 03/30/19 1456  WBC 28.6*  --   RBC 5.00  --   HGB 15.2 16.7  16.3  HCT 45.7 49.0  48.0  MCV 91.4  --   MCH 30.4  --   MCHC 33.3  --   RDW 12.3  --   PLT 454*  --    Cardiac  Enzymes High Sensitivity Troponin:  No results for input(s): TROPONINIHS in the last 720 hours.    BNPNo results for input(s): BNP, PROBNP in the last 168 hours.  DDimer No results for input(s): DDIMER in the last 168 hours. TSH: No results found for: TSH Lipids: Lab Results  Component Value Date   CHOL 76 09/17/2013   HDL 19 (L) 09/17/2013   LDLCALC 36 09/17/2013   TRIG 104 09/17/2013   CHOLHDL 4.0 09/17/2013   HgbA1c: Lab Results  Component Value Date   HGBA1C 6.1 (H) 09/17/2013   Magnesium: No results found for: MG  Radiology/Studies:  DG Chest Portable 1 View  Result Date: 03/30/2019 CLINICAL DATA:  Chest pain and shortness of breath EXAM: PORTABLE CHEST 1 VIEW COMPARISON:  March 23, 2018 FINDINGS: Heart is enlarged with pulmonary venous hypertension. There is interstitial edema. No appreciable consolidation. No pleural effusions evident. No adenopathy. Stimulator leads are in the lower thoracic region, stable. No pneumothorax. No bone lesions. IMPRESSION: Cardiomegaly with pulmonary vascular congestion. Interstitial edema. Appearance indicative of congestive heart failure. No consolidation. Electronically Signed   By: Lowella Grip III M.D.   On: 03/30/2019 15:08    Assessment and Plan:   1.  Chest pain: -His ECG is significantly abnormal, but this may be largely due to his metabolic abnormalities -We will check an echo, cycle enzymes -Follow symptoms -Decision to be made on further evaluation once above data are reviewed  2.  Hyperkalemia -According to the patient, his creatinine is generally above 3, he is 3.5 today. -However, his potassium is significantly abnormal at 8.0 -Management per MD  3.  Leukocytosis: -His white count is 28,000, no fever -Covid negative  4.  Hyperglycemia -Initial glucose is greater than 700, EMS got over 450. -Management per MD  5.  CHF: -His chest x-ray shows CHF - BNP is pending  6.  History of PE -He has been very  inactive recently, will check lower extremity Dopplers  Active Problems:   Hyperkalemia   Hyperglycemia   Chest pain with moderate risk for cardiac etiology     For questions or updates, please contact [place] Please consult [email or web address] for contact info under Cardiology/STEMI.   Jonetta Speak, PA-C  03/30/2019 3:45 PM

## 2019-03-30 NOTE — Progress Notes (Signed)
Not able to start CRRT due to positional issues with HD cath. E-link notified.

## 2019-03-30 NOTE — Procedures (Signed)
Hemodialysis Catheter Insertion Procedure Note [place] [id number] 1965/06/23  Procedure: Insertion of Hemodialysis Catheter Indications: Hemodialysis  Procedure Details Consent: Unable to obtain consent because of emergent medical necessity. Time Out: Verified patient identification, verified procedure, site/side was marked, verified correct patient position, special equipment/implants available, medications/allergies/relevent history reviewed, required imaging and test results available.  Performed  Maximum sterile technique was used including antiseptics, cap, gloves, gown, hand hygiene, mask and sheet. Skin prep: Chlorhexidine; local anesthetic administered A antimicrobial bonded/coated triple lumen catheter was placed in the left internal jugular vein using the Seldinger technique.  Evaluation Blood flow good Complications: No apparent complications Patient did tolerate procedure well. Chest X-ray ordered to verify placement.  CXR: pending.  Procedure performed under direct ultrasound guidance for real time vessel cannulation.      Jesus Cruz, [facility] Pgr: [phone]  or [phone] 03/30/2019, 4:28 PM

## 2019-03-30 NOTE — ED Provider Notes (Signed)
Emergency Department Provider Note   I have reviewed the triage vital signs and the nursing notes.   HISTORY  Chief Complaint Code STEMI   HPI Jesus Cruz is a 54 y.o. male with PMH of CAD, HTN, and prior CVA presents to the emergency department by EMS as a CODE STEMI.  Patient developed chest pressure and shortness of breath last night at midnight.  EMS arrived to find the patient diaphoretic and complaining of shortness of breath.  His initial EKG was less concerning but he developed return of chest tightness in route.  Repeat EKG was transmitted and code STEMI was activated.  He denies any fever or chills.    Past Medical History:  Diagnosis Date  . CVA (cerebral vascular accident) ([place])   . Gout   . Hypertension   . MGUS (monoclonal gammopathy of unknown significance)   . Obesity   . OSA on CPAP   . Pulmonary embolus ([place]) 05/2012   During recovery from MI, completed Coumadin  . STEMI (ST elevation myocardial infarction) ([place]) 04/2012   Integrity BMS RCA with minor left system disease    Patient Active Problem List   Diagnosis Date Noted  . Hyperkalemia 03/30/2019  . Hyperglycemia 03/30/2019  . Chest pain with moderate risk for cardiac etiology 03/30/2019  . Acute renal failure ([place])   . Stroke with cerebral ischemia ([place]) 09/16/2013    Past Surgical History:  Procedure Laterality Date  . CORONARY ANGIOPLASTY WITH STENT PLACEMENT  04/2012   Integrity BMS RCA, minor L system dz  . RADIOLOGY WITH ANESTHESIA N/A 09/16/2013   Procedure: RADIOLOGY WITH ANESTHESIA;  Surgeon: Rob Hickman, MD;  Location: [facility];  Service: Radiology;  Laterality: N/A;    Allergies Other  History reviewed. No pertinent family history.  Social History Social History   Tobacco Use  . Smoking status: Never Smoker  . Smokeless tobacco: Never Used  Substance Use Topics  . Alcohol use: No  . Drug use: Not on file    Review of Systems  Constitutional: No  fever/chills Eyes: No visual changes. ENT: No sore throat. Cardiovascular: Positive chest pain. Respiratory: Positive shortness of breath. Gastrointestinal: No abdominal pain.  No nausea, no vomiting.  No diarrhea.  No constipation. Genitourinary: Negative for dysuria. Musculoskeletal: Negative for back pain. Skin: Negative for rash. Neurological: Negative for headaches, focal weakness or numbness.  10-point ROS otherwise negative.  ____________________________________________   PHYSICAL EXAM:  VITAL SIGNS: ED Triage Vitals [03/30/19 1447]  Enc Vitals Group     BP 116/80     Pulse Rate (!) 107     Resp (!) 24     Temp 98.4 F (36.9 C)     Temp Source Oral     SpO2 91 %   Constitutional: Alert and oriented. Appears somnolent and ill-appearing.  Eyes: Conjunctivae are normal. PERRL.  Head: Atraumatic. Nose: No congestion/rhinnorhea. Mouth/Throat: Mucous membranes are moist.  Neck: No stridor.  Cardiovascular: Tachycardia. Good peripheral circulation. Grossly normal heart sounds.   Respiratory: Normal respiratory effort.  No retractions. Lungs CTAB. Gastrointestinal: Soft and nontender. No distention.  Musculoskeletal: No lower extremity tenderness but 2+ pitting edema in the bilateral LEs.  Neurologic:  Normal speech and language. Moving extremities equally but slow to respond.  Skin:  Skin is warm, dry and intact. No rash noted.  ____________________________________________   LABS (all labs ordered are listed, but only abnormal results are displayed)  Labs Reviewed  MRSA PCR SCREENING - Abnormal; Notable for  the following components:      Result Value   MRSA by PCR POSITIVE (*)    All other components within normal limits  COMPREHENSIVE METABOLIC PANEL - Abnormal; Notable for the following components:   Sodium 120 (*)    Potassium >7.5 (*)    Chloride 83 (*)    CO2 19 (*)    Glucose, Bld 857 (*)    BUN 101 (*)    Creatinine, Ser 3.82 (*)    Calcium 8.6 (*)     Albumin 3.0 (*)    Alkaline Phosphatase 224 (*)    GFR calc non Af Amer 17 (*)    GFR calc Af Amer 19 (*)    Anion gap 18 (*)    All other components within normal limits  CBC WITH DIFFERENTIAL/PLATELET - Abnormal; Notable for the following components:   WBC 28.6 (*)    Platelets 454 (*)    Neutro Abs 25.5 (*)    Abs Immature Granulocytes 0.60 (*)    All other components within normal limits  LACTIC ACID, PLASMA - Abnormal; Notable for the following components:   Lactic Acid, Venous 3.4 (*)    All other components within normal limits  LACTIC ACID, PLASMA - Abnormal; Notable for the following components:   Lactic Acid, Venous 2.8 (*)    All other components within normal limits  BASIC METABOLIC PANEL - Abnormal; Notable for the following components:   Sodium 130 (*)    Potassium 6.3 (*)    Chloride 91 (*)    Glucose, Bld 531 (*)    BUN 96 (*)    Creatinine, Ser 3.18 (*)    GFR calc non Af Amer 21 (*)    GFR calc Af Amer 24 (*)    Anion gap 16 (*)    All other components within normal limits  BASIC METABOLIC PANEL - Abnormal; Notable for the following components:   Sodium 131 (*)    Potassium 5.6 (*)    Chloride 96 (*)    Glucose, Bld 261 (*)    BUN 91 (*)    Creatinine, Ser 2.66 (*)    Calcium 8.7 (*)    GFR calc non Af Amer 26 (*)    GFR calc Af Amer 30 (*)    All other components within normal limits  HEMOGLOBIN A1C - Abnormal; Notable for the following components:   Hgb A1c MFr Bld 12.0 (*)    All other components within normal limits  URINALYSIS, ROUTINE W REFLEX MICROSCOPIC - Abnormal; Notable for the following components:   Glucose, UA 150 (*)    Hgb urine dipstick SMALL (*)    All other components within normal limits  RAPID URINE DRUG SCREEN, HOSP PERFORMED - Abnormal; Notable for the following components:   Opiates POSITIVE (*)    All other components within normal limits  CBC - Abnormal; Notable for the following components:   WBC 33.8 (*)    All other  components within normal limits  GLUCOSE, CAPILLARY - Abnormal; Notable for the following components:   Glucose-Capillary >600 (*)    All other components within normal limits  BRAIN NATRIURETIC PEPTIDE - Abnormal; Notable for the following components:   B Natriuretic Peptide 549.1 (*)    All other components within normal limits  GLUCOSE, CAPILLARY - Abnormal; Notable for the following components:   Glucose-Capillary 551 (*)    All other components within normal limits  MAGNESIUM - Abnormal; Notable for the following components:  Magnesium 2.6 (*)    All other components within normal limits  PHOSPHORUS - Abnormal; Notable for the following components:   Phosphorus 4.9 (*)    All other components within normal limits  GLUCOSE, CAPILLARY - Abnormal; Notable for the following components:   Glucose-Capillary 505 (*)    All other components within normal limits  GLUCOSE, CAPILLARY - Abnormal; Notable for the following components:   Glucose-Capillary 432 (*)    All other components within normal limits  GLUCOSE, CAPILLARY - Abnormal; Notable for the following components:   Glucose-Capillary 428 (*)    All other components within normal limits  GLUCOSE, CAPILLARY - Abnormal; Notable for the following components:   Glucose-Capillary 367 (*)    All other components within normal limits  GLUCOSE, CAPILLARY - Abnormal; Notable for the following components:   Glucose-Capillary 246 (*)    All other components within normal limits  GLUCOSE, CAPILLARY - Abnormal; Notable for the following components:   Glucose-Capillary 153 (*)    All other components within normal limits  GLUCOSE, CAPILLARY - Abnormal; Notable for the following components:   Glucose-Capillary 275 (*)    All other components within normal limits  GLUCOSE, CAPILLARY - Abnormal; Notable for the following components:   Glucose-Capillary 292 (*)    All other components within normal limits  GLUCOSE, CAPILLARY - Abnormal;  Notable for the following components:   Glucose-Capillary 272 (*)    All other components within normal limits  GLUCOSE, CAPILLARY - Abnormal; Notable for the following components:   Glucose-Capillary 248 (*)    All other components within normal limits  RENAL FUNCTION PANEL - Abnormal; Notable for the following components:   Sodium 133 (*)    Potassium 5.5 (*)    Glucose, Bld 235 (*)    BUN 89 (*)    Creatinine, Ser 2.60 (*)    Albumin 2.5 (*)    GFR calc non Af Amer 27 (*)    GFR calc Af Amer 31 (*)    All other components within normal limits  GLUCOSE, CAPILLARY - Abnormal; Notable for the following components:   Glucose-Capillary 224 (*)    All other components within normal limits  GLUCOSE, CAPILLARY - Abnormal; Notable for the following components:   Glucose-Capillary 203 (*)    All other components within normal limits  GLUCOSE, CAPILLARY - Abnormal; Notable for the following components:   Glucose-Capillary 207 (*)    All other components within normal limits  I-STAT CHEM 8, ED - Abnormal; Notable for the following components:   Sodium 118 (*)    Potassium 8.0 (*)    Chloride 90 (*)    BUN 102 (*)    Creatinine, Ser 3.50 (*)    Glucose, Bld >700 (*)    Calcium, Ion 1.04 (*)    All other components within normal limits  POCT I-STAT EG7 - Abnormal; Notable for the following components:   pCO2, Ven 39.5 (*)    Acid-base deficit 3.0 (*)    Sodium 118 (*)    Potassium 7.9 (*)    Calcium, Ion 1.04 (*)    All other components within normal limits  CBG MONITORING, ED - Abnormal; Notable for the following components:   Glucose-Capillary >600 (*)    All other components within normal limits  POCT I-STAT 7, (LYTES, BLD GAS, ICA,H+H) - Abnormal; Notable for the following components:   Sodium 128 (*)    Potassium 6.2 (*)    All other components within normal  limits  TROPONIN I (HIGH SENSITIVITY) - Abnormal; Notable for the following components:   Troponin I (High  Sensitivity) 2,304 (*)    All other components within normal limits  TROPONIN I (HIGH SENSITIVITY) - Abnormal; Notable for the following components:   Troponin I (High Sensitivity) 4,413 (*)    All other components within normal limits  TROPONIN I (HIGH SENSITIVITY) - Abnormal; Notable for the following components:   Troponin I (High Sensitivity) 5,272 (*)    All other components within normal limits  RESPIRATORY PANEL BY RT PCR (FLU A&B, COVID)  CULTURE, BLOOD (ROUTINE X 2)  CULTURE, BLOOD (ROUTINE X 2)  LIPASE, BLOOD  BRAIN NATRIURETIC PEPTIDE  AMMONIA  PROCALCITONIN  SODIUM, URINE, RANDOM  CREATININE, URINE, RANDOM  HIV ANTIBODY (ROUTINE TESTING W REFLEX)  MAGNESIUM  PHOSPHORUS  PROCALCITONIN  APTT  BLOOD GAS, ARTERIAL  BASIC METABOLIC PANEL  RENAL FUNCTION PANEL  OSMOLALITY  I-STAT VENOUS BLOOD GAS, ED   ____________________________________________  EKG   EKG Interpretation  Date/Time:  Tuesday March 30 2019 14:44:44 EDT Ventricular Rate:  105 PR Interval:    QRS Duration: 158 QT Interval:  413 QTC Calculation: 546 R Axis:   102 Text Interpretation: Sinus or ectopic atrial tachycardia Dathan Attia R-R with ventricular escape Nonspecific intraventricular conduction delay Abnormal Q wave in V1 Minimal ST depression ST changes inferiorly. Confirmed by Nanda Quinton [phone]) on 03/30/2019 2:53:46 PM       ____________________________________________  RADIOLOGY  US RENAL  Result Date: 03/30/2019 CLINICAL DATA:  Acute kidney injury EXAM: RENAL / URINARY TRACT ULTRASOUND COMPLETE COMPARISON:  CT abdomen 01/27/2017 FINDINGS: Right Kidney: Renal measurements: 8.5 by 4.8 by 4.2 cm = volume: 90 mL . Echogenicity within normal limits. No mass or hydronephrosis visualized. Scarring in the right kidney most strikingly in the upper pole. Left Kidney: The left kidney cannot be visualized sonographically. Bladder: Not visualized sonographically. Other: Limiting factors included patient's  sedation and patient body habitus. IMPRESSION: 1. Considerably atrophic right kidney. 2. Nonvisualization of the left kidney and urinary bladder. Electronically Signed   By: Van Clines M.D.   On: 03/30/2019 21:21   DG CHEST PORT 1 VIEW  Result Date: 03/30/2019 CLINICAL DATA:  Central line placement. Acute kidney injury. Hyperkalemia. Pulmonary edema. EXAM: PORTABLE CHEST 1 VIEW COMPARISON:  Chest x-rays dated 03/30/2019 and 03/23/2018 FINDINGS: Double lumen central venous catheter has been inserted. The tip is overlying the superior vena cava just below the carina in good position. Persistent pulmonary vascular congestion and mild perihilar haziness consistent with mild edema. Mild bibasilar atelectasis. No pneumothorax. No acute bone abnormality. Thoracic spinal cord stimulator in place. IMPRESSION: Central line in good position. No pneumothorax. Persistent pulmonary vascular congestion and mild perihilar edema. Electronically Signed   By: Lorriane Shire M.D.   On: 03/30/2019 18:38   DG Chest Portable 1 View  Result Date: 03/30/2019 CLINICAL DATA:  Chest pain and shortness of breath EXAM: PORTABLE CHEST 1 VIEW COMPARISON:  March 23, 2018 FINDINGS: Heart is enlarged with pulmonary venous hypertension. There is interstitial edema. No appreciable consolidation. No pleural effusions evident. No adenopathy. Stimulator leads are in the lower thoracic region, stable. No pneumothorax. No bone lesions. IMPRESSION: Cardiomegaly with pulmonary vascular congestion. Interstitial edema. Appearance indicative of congestive heart failure. No consolidation. Electronically Signed   By: Lowella Grip III M.D.   On: 03/30/2019 15:08    ____________________________________________   PROCEDURES  Procedure(s) performed:   .Critical Care Performed by: Margette Fast, MD Authorized by:  Kalab Camps, Wonda Olds, MD   Critical care provider statement:    Critical care time (minutes):  75   Critical care time was  exclusive of:  Separately billable procedures and treating other patients and teaching time   Critical care was necessary to treat or prevent imminent or life-threatening deterioration of the following conditions:  Endocrine crisis, circulatory failure, respiratory failure and metabolic crisis   Critical care was time spent personally by me on the following activities:  Discussions with consultants, evaluation of patient's response to treatment, examination of patient, ordering and performing treatments and interventions, ordering and review of laboratory studies, ordering and review of radiographic studies, pulse oximetry, re-evaluation of patient's condition, obtaining history from patient or surrogate and review of old charts   I assumed direction of critical care for this patient from another provider in my specialty: no      ____________________________________________   INITIAL IMPRESSION / [place] / ED COURSE  Pertinent labs & imaging results that were available during my care of the patient were reviewed by me and considered in my medical decision making (see chart for details).   Patient arrives to the emergency department as a code STEMI.  Dr. [name] and cardiology team evaluated the patient at bedside.  EKG shows some ST changes inferiorly but will want further investigation including consideration of hyperkalemia, PCO2, and renal function prior to taking patient to the Cath Lab.  He is afebrile here.  I have added pneumonia along with lactate and blood cultures with continued broad differential at this point will hold on heparin and nitroglycerin with blood pressure in the systolic [id number].   [id number] PM  I-STAT Chem-8 coming back with glucose greater than 700 with creatinine of 3.5, potassium of 8.0, sodium of 118 likely pseudohyponatremia.  With EKG changes suggesting hyperkalemia plan for potassium shifting along with calcium gluconate.  Will contact nephrology.  Additional blood  work is pending.   Spoke with Dr. Carolin Sicks with Nephrology.   03:40 PM  Called to the patient's bedside by nursing with concern for widening tachycardia on monitor.  Patient was given additional calcium and bicarb with visible narrowing of the rhythm on monitor.  Patient remained awake and alert.  He did not lose pulses or require chest compressions.  Spoke with intensive care regarding the admit and they will be down to see the patient.   I reviewed all nursing notes, vitals, pertinent old records, EKGs, labs, imaging (as available).  ____________________________________________  FINAL CLINICAL IMPRESSION(S) / ED DIAGNOSES  Final diagnoses:  Hyperkalemia  Acute renal failure, unspecified acute renal failure type ([place])  Acute pulmonary edema ([facility])     MEDICATIONS GIVEN DURING THIS VISIT:  Medications  heparin injection 5,000 Units (5,000 Units Subcutaneous Given 03/31/19 0630)  insulin regular, human (MYXREDLIN) 100 units/ 100 mL infusion ( Intravenous Rate/Dose Verify 03/31/19 0600)  0.9 %  sodium chloride infusion ( Intravenous Stopped 03/30/19 2328)  dextrose 5 %-0.45 % sodium chloride infusion ( Intravenous Rate/Dose Verify 03/31/19 0600)  dextrose 50 % solution 0-50 mL (has no administration in time range)  sodium zirconium cyclosilicate (LOKELMA) packet 10 g (has no administration in time range)  heparin injection 1,000-6,000 Units (has no administration in time range)  heparin 10,000 units/ 20 mL infusion syringe (0 Units/hr CRRT Stopped 03/30/19 2120)  heparin bolus via infusion syringe 1,000 Units (has no administration in time range)  prismasol BGK 0/2.5 infusion ( CRRT Stopped 03/30/19 2115)  prismasol BGK 0/2.5 infusion ( CRRT  Stopped 03/30/19 2115)  prismasol BGK 0/2.5 infusion ( CRRT Stopped 03/30/19 2115)  vancomycin (VANCOCIN) 2,500 mg in sodium chloride 0.9 % 500 mL IVPB (has no administration in time range)  ceFEPIme (MAXIPIME) 2 g in sodium chloride 0.9 % 100 mL IVPB  ( Intravenous Stopped 03/30/19 1924)  vancomycin (VANCOREADY) IVPB 1500 mg/300 mL (has no administration in time range)  mupirocin ointment (BACTROBAN) 2 % 1 application (1 application Nasal Given 03/30/19 2310)  Chlorhexidine Gluconate Cloth 2 % PADS 6 each (6 each Topical Given 03/31/19 0525)  alteplase (CATHFLO ACTIVASE) injection 4 mg (has no administration in time range)  insulin aspart (novoLOG) injection 10 Units (10 Units Intravenous Given 03/30/19 1526)  sodium bicarbonate injection 50 mEq (50 mEq Intravenous Given 03/30/19 1525)  calcium gluconate 1 g/ 50 mL sodium chloride IVPB (0 g Intravenous Stopped 03/30/19 1545)  calcium chloride injection (1,000 mg Intravenous Given 03/30/19 1533)  sodium bicarbonate injection (50 mEq Intravenous Given 03/30/19 1534)  metroNIDAZOLE (FLAGYL) IVPB 500 mg ( Intravenous Stopped 03/30/19 1837)  alteplase (CATHFLO ACTIVASE) injection 2 mg (2 mg Intracatheter Given 03/30/19 2224)  furosemide (LASIX) injection 60 mg (60 mg Intravenous Given 03/30/19 2309)  heparin injection 2,800 Units (2,800 Units Intracatheter Given 03/31/19 0030)  heparin injection 2,800 Units (2,800 Units Intravenous Given 03/31/19 0129)    Note:  This document was prepared using Dragon voice recognition software and may include unintentional dictation errors.  Nanda Quinton, MD, [facility]    Hayle Parisi, Wonda Olds, MD 03/31/19 [phone]

## 2019-03-30 NOTE — H&P (Signed)
NAME:  Jesus Cruz, MRN:  [id number], DOB:  [birth year], LOS: 0 ADMISSION DATE:  03/30/2019, CONSULTATION DATE:  03/30/2019 REFERRING MD:  Dr. Laverta Baltimore, CHIEF COMPLAINT:  HHS with hyperkalemia    Brief History   54 year old male admitted with complaints of chest pain as a code STEMI found to have hyperkalemia and acute renal failure PCCM consulted for admission  History of present illness   Jesus Cruz is a 54 year old male with a past medical history significant for CAD, hypertension, and prior CVA who presented via EMS with complaints of chest pain as a code STEMI.  On arrival patient was seen diaphoretic and with complaints of shortness of breath. Cardiology evaluated on admission with recommendations of treatment for hyperkalemia and renal failure prior to taking patient to the Cath Lab.  Complete lab work currently pending however initial laboratory abnormalities include sodium of 118, potassium of 8, glucose greater than 700, creatinine 3.50 and WBC 28.6. EKG with tall peaked T-wave.   Nephrology consulted on admission and reccommended CRRT for acute renal failure and hyperkalemia.   PCCM consulted for admission  Past Medical History  STEMI in 2014 PE OSA no CPAP  Obesity HTN GOUT CVA DM   Significant Hospital Events   Amitted 3/23  Consults:  Cardiology  Nephrology   Procedures:  Left IJ HD cath 3/23  Significant Diagnostic Tests:    Micro Data:  COVID 3/23 > Negative   Antimicrobials:     Interim history/subjective:  Sitting up in ED stretcher in NAD  Objective   Blood pressure 128/90, pulse (!) 108, temperature 98.4 F (36.9 C), temperature source Oral, resp. rate (!) 24, height 5\' 7"  (1.702 m), weight (!) 140 kg, SpO2 92 %.       No intake or output data in the 24 hours ending 03/30/19 1546 Filed Weights   03/30/19 1513  Weight: (!) 140 kg    Examination: General: Chronically ill appearing obese male lying in bed, in NAD HEENT: /AT, MM  pink/moist, PERRL,  Neuro: Alert with mild confusion, able to be redirected, spontaneously seen moving all extremities,  CV: s1s2 regular rate and rhythm, no murmur, rubs, or gallops,  PULM:  Diminished air entry, no added breath sounds, no increased work of breathing GI: soft, bowel sounds active in all 4 quadrants, non-tender, non-distended Extremities: warm/dry, 1+ lower extremity edema  Skin: no rashes or lesions   Resolved Hospital Problem list     Assessment & Plan:   Acute renal failure on CKD Hyperkalemia  -Most recent Creatine on chart review was 1.03 06/2017, Creatinine 3.50 on admission. On admission patient stated creatine typically ranges in the 3's P: Nephrology consulted, plan for CRRT  Follow renal function / urine output Trend Bmet Avoid nephrotoxins, ensure adequate renal perfusion  IV hydration Obtain urine lytes  Obtain renal ultrasound   Acute Hypoxic Respiratory insufficiency  -Secondary to volume overload  P: Continue supplemental oxygen  Volume removal per CRRT Head of bed elevated 30 degrees. Follow intermittent chest x-ray and ABG.   Ensure adequate pulmonary hygiene   Chest pain with history of CAD Prior STEMI 2014 Hyperlipidemia  -EKG changes felt related to metabolic derangements as opposed to STEMI P: Cardiology following  Trend troponin Continuous telemetry  Continue home statin  Obtain ECHO  Obtain and trend BNP  HHS -pH 7.35 glucose greater then 700 on admission, full Bmet pending at time of admission  P: Aggressive IV hydration  Insulin drip  Closely monitor patient's electrolytes with  frequent Bmets especially potassium Maintain potassium greater than 4 Accu-Cheks every 1 hour Once blood glucose falls below 250 start patient on D5 IV fluids When aniongap closes and patient is able to tolerate oral diet transition to subcu long-acting insulin in slowly turned drip off over the next 1-2 hours Monitor renal function Recent  hemoglobin A1c 6.1  Hypertension -Home medications include Norvasc, Cardura, Lisinopril, Metoprolol P:  Hold home medications  Monitor in the ICU setting   Leucocytosis  -WBC 28.6 on admission, afebrile. Likely reactive  P: Empiric antibiotics  Trend CBC and fever curve   History of PE -Patient reports dyspnea on admission with decrease in activity recently. Dose no appear to be on long term anticoagulation at baseline P: Obtain lower extremity dopplers Consider Vq scan  Continuous telemetry   Best practice:  Diet: NPO Pain/Anxiety/Delirium protocol (if indicated): PRN's VAP protocol (if indicated): N/A DVT prophylaxis: Heparin  GI prophylaxis: PPI Glucose control: Insulin drip Mobility:  Bedrest  Code Status: Full Family Communication: Updated Disposition:   Labs   CBC: Recent Labs  Lab 03/30/19 1451 03/30/19 1456  WBC 28.6*  --   NEUTROABS 25.5*  --   HGB 15.2 16.7  16.3  HCT 45.7 49.0  48.0  MCV 91.4  --   PLT 454*  --     Basic Metabolic Panel: Recent Labs  Lab 03/30/19 1456  NA 118*  118*  K 8.0*  7.9*  CL 90*  GLUCOSE >700*  BUN 102*  CREATININE 3.50*   GFR: Estimated Creatinine Clearance: 32.7 mL/min (A) (by C-G formula based on SCr of 3.5 mg/dL (H)). Recent Labs  Lab 03/30/19 1451  WBC 28.6*    Liver Function Tests: No results for input(s): AST, ALT, ALKPHOS, BILITOT, PROT, ALBUMIN in the last 168 hours. No results for input(s): LIPASE, AMYLASE in the last 168 hours. Recent Labs  Lab 03/30/19 1512  AMMONIA 31    ABG    Component Value Date/Time   HCO3 22.0 03/30/2019 1456   TCO2 22 03/30/2019 1456   TCO2 23 03/30/2019 1456   ACIDBASEDEF 3.0 (H) 03/30/2019 1456   O2SAT 65.0 03/30/2019 1456     Coagulation Profile: No results for input(s): INR, PROTIME in the last 168 hours.  Cardiac Enzymes: No results for input(s): CKTOTAL, CKMB, CKMBINDEX, TROPONINI in the last 168 hours.  HbA1C: Hgb A1c MFr Bld  Date/Time Value  Ref Range Status  09/17/2013 04:30 AM 6.1 (H) <5.7 % Final    Comment:    (NOTE)                                                                       According to the ADA Clinical Practice Recommendations for 2011, when HbA1c is used as a screening test:  >=6.5%   Diagnostic of Diabetes Mellitus           (if abnormal result is confirmed) 5.7-6.4%   Increased risk of developing Diabetes Mellitus References:Diagnosis and Classification of Diabetes Mellitus,Diabetes [id number] 1):S62-S69 and Standards of Medical Care in         Diabetes - 2011,Diabetes [id number] (Suppl 1):S11-S61.    CBG: No results for input(s): GLUCAP in the last 168 hours.  Review of Systems:  Unable to obtain due to encephalopathy   Past Medical History  He,  has a past medical history of CVA (cerebral vascular accident) ([place]), Gout, Hypertension, MGUS (monoclonal gammopathy of unknown significance), Obesity, OSA on CPAP, Pulmonary embolus ([place]) (05/2012), and STEMI (ST elevation myocardial infarction) ([place]) (04/2012).   Surgical History    Past Surgical History:  Procedure Laterality Date  . CORONARY ANGIOPLASTY WITH STENT PLACEMENT  04/2012   Integrity BMS RCA, minor L system dz  . RADIOLOGY WITH ANESTHESIA N/A 09/16/2013   Procedure: RADIOLOGY WITH ANESTHESIA;  Surgeon: Rob Hickman, MD;  Location: [facility];  Service: Radiology;  Laterality: N/A;     Social History   reports that he has never smoked. He has never used smokeless tobacco. He reports that he does not drink alcohol.   Family History   His family history is not on file.   Allergies No Known Allergies   Home Medications  Prior to Admission medications   Medication Sig Start Date End Date Taking? Authorizing Provider  allopurinol (ZYLOPRIM) 300 MG tablet Take 300 mg by mouth daily.    [provider]  amLODipine (NORVASC) 10 MG tablet Take 10 mg by mouth daily.    [provider]  aspirin EC 325 MG  tablet TAKE 1 TABLET BY MOUTH EVERY DAY 11/14/13   Rosalin Hawking, MD  atorvastatin (LIPITOR) 10 MG tablet Take 10 mg by mouth daily. 03/09/19   [provider]  colchicine 0.6 MG tablet Take 0.6 mg by mouth daily.    [provider]  doxazosin (CARDURA) 2 MG tablet Take 2 mg by mouth at bedtime. 12/24/18   [provider]  HYDROcodone-acetaminophen (NORCO) 10-325 MG per tablet Take 1 tablet by mouth every 6 (six) hours as needed (for pain).     [provider]  lisinopril (ZESTRIL) 10 MG tablet Take 10 mg by mouth daily. 12/24/18   [provider]  metoprolol succinate (TOPROL-XL) 100 MG 24 hr tablet Take 300 mg by mouth daily. Take with or immediately following a meal.    [provider]  nitroGLYCERIN (NITROSTAT) 0.4 MG SL tablet Place 0.4 mg under the tongue every 5 (five) minutes as needed for chest pain.    [provider]  OxyCODONE (OXYCONTIN) 20 mg T12A 12 hr tablet Take 20 mg by mouth every 12 (twelve) hours.    [provider]  pregabalin (LYRICA) 150 MG capsule Take 150 mg by mouth 2 (two) times daily.    [provider]     Critical care time:    Performed by: Johnsie Cancel   Total critical care time: 40 minutes  Critical care time was exclusive of separately billable procedures and treating other patients.  Critical care was necessary to treat or prevent imminent or life-threatening deterioration.  Critical care was time spent personally by me on the following activities: development of treatment plan with patient and/or surrogate as well as nursing, discussions with consultants, evaluation of patient's response to treatment, examination of patient, obtaining history from patient or surrogate, ordering and performing treatments and interventions, ordering and review of laboratory studies, ordering and review of radiographic studies, pulse oximetry and re-evaluation of patient's condition.  Johnsie Cancel, NP-C [facility] Contact / Pager information can be found on Amion  03/30/2019, 4:45 PM

## 2019-03-30 NOTE — ED Notes (Signed)
ekg changes noted, ekg captured and given to EDP. Pt alert, good peripheral pulses.

## 2019-03-30 NOTE — Progress Notes (Signed)
Pharmacy Antibiotic Note  Jesus Cruz is a 54 y.o. male admitted on 03/30/2019 with sepsis. Pharmacy has been consulted for vancomycin and cefepime dosing.  He is being started on CRRT per nephrology d/t AKI on CKD and transferred to the ICU.  WBC 28.6, lactate 3.4, Tmax 98.4.   Plan: Vancomycin 2500mg  IV x1 then 1500mg  IV Q24h Cefepime 2g IV Q12h F/u clinical progress, c/s, renal function, nephrology plans, de-escalation, and LOT   Height: 5\' 7"  (170.2 cm) Weight: (!) 308 lb 10.3 oz (140 kg) IBW/kg (Calculated) : 66.1  Temp (24hrs), Avg:98.4 F (36.9 C), Min:98.4 F (36.9 C), Max:98.4 F (36.9 C)  Recent Labs  Lab 03/30/19 1451 03/30/19 1456  WBC 28.6*  --   CREATININE 3.82* 3.50*  LATICACIDVEN  --  3.4*    Estimated Creatinine Clearance: 32.7 mL/min (A) (by C-G formula based on SCr of 3.5 mg/dL (H)).    Allergies  Allergen Reactions  . Other Nausea And Vomiting    Mayonnaise      Antimicrobials this admission: 3/23 vancomycin >>  3/23 cefepime >>  3/23 flagyl x1  Dose adjustments this admission: N/A  Microbiology results: 3/23 BCx: sent  Thank you for allowing pharmacy to be a part of this patient's care.  Kennon Holter, PharmD PGY1 Ambulatory Care Pharmacy Resident 03/30/2019 4:29 PM

## 2019-03-30 NOTE — Progress Notes (Signed)
eLink Physician-Brief Progress Note Patient Name: Jesus Cruz DOB: 07/29/1965 MRN: [id number]   Date of Service  03/30/2019  HPI/Events of Note  Dialysis line is positional and impeding CRRT.  eICU Interventions  PCCM Bedside requested  to go and troubleshoot the catheter.        Kerry Kass Issiac Jamar 03/30/2019, 10:01 PM

## 2019-03-30 NOTE — Consult Note (Addendum)
[facility]  INPATIENT CONSULTATION  Reason for Consultation: AKI, severe hyperkalemia Requesting Provider: Dr. Laverta Baltimore  HPI: Jesus Cruz is an 54 y.o. male with HTN, gout, CAD, MGUS, NIDDM who is seen for eval of AKI and Hyperkalemia.   History limited as patient unable to converse much.  Presented via EMS with CP and dyspnea.  EKG concerning for STEMI, cardiology consulted.  CXR with edema.  Labs showing K 8, BUN 102, Cr 3.5, Gluc > 700.  EKG initially narrow then converted to [facility] and improved with ca chloride, bicarb IV, insulin.  Pt says not on insulin typically and told cardiology Cr generally 3.  Endorses swelling, chest heavy, dyspnea, orthopnea.  PCCM has been consulted for admission.    PMH: Past Medical History:  Diagnosis Date  . CVA (cerebral vascular accident) ([place])   . Gout   . Hypertension   . MGUS (monoclonal gammopathy of unknown significance)   . Obesity   . OSA on CPAP   . Pulmonary embolus ([place]) 05/2012   During recovery from MI, completed Coumadin  . STEMI (ST elevation myocardial infarction) ([place]) 04/2012   Integrity BMS RCA with minor left system disease   PSH: Past Surgical History:  Procedure Laterality Date  . CORONARY ANGIOPLASTY WITH STENT PLACEMENT  04/2012   Integrity BMS RCA, minor L system dz  . RADIOLOGY WITH ANESTHESIA N/A 09/16/2013   Procedure: RADIOLOGY WITH ANESTHESIA;  Surgeon: Rob Hickman, MD;  Location: [facility];  Service: Radiology;  Laterality: N/A;    Past Medical History:  Diagnosis Date  . CVA (cerebral vascular accident) ([place])   . Gout   . Hypertension   . MGUS (monoclonal gammopathy of unknown significance)   . Obesity   . OSA on CPAP   . Pulmonary embolus ([place]) 05/2012   During recovery from MI, completed Coumadin  . STEMI (ST elevation myocardial infarction) ([place]) 04/2012   Integrity BMS RCA with minor left system disease    Medications:  I have reviewed the patient's current medications.  (Not in a  hospital admission)   ALLERGIES:  No Known Allergies  FAM HX: History reviewed. No pertinent family history.  Social History:   reports that he has never smoked. He has never used smokeless tobacco. He reports that he does not drink alcohol. No history on file for drug.  ROS: 12 system ROS per HPI above  Blood pressure 128/90, pulse (!) 108, temperature 98.4 F (36.9 C), temperature source Oral, resp. rate (!) 24, height 5\' 7"  (1.702 m), weight (!) 140 kg, SpO2 92 %. PHYSICAL EXAM: Gen: obese man who is somewhat lethargic and chronically ill appearing Eyes:  anicteric ENT: MMM Neck: limited due to habitus CV:  RRR, no rub Abd:  Soft, obese Lungs: sl ^ WOB, dec BS bases, no rales appreciated GU: no foley, bedside bladder scan <248mL while I was present Extr: 2+ LE edema, ruddy discoloration of legs Neuro: difficulty speaking due to mask it appears; oriented and conversant Skin: cool and dry   Results for orders placed or performed during the hospital encounter of 03/30/19 (from the past 48 hour(s))  Respiratory Panel by RT PCR (Flu A&B, Covid) - Nasopharyngeal Swab     Status: None   Collection Time: 03/30/19  2:39 PM   Specimen: Nasopharyngeal Swab  Result Value Ref Range   SARS Coronavirus 2 by RT PCR NEGATIVE NEGATIVE    Comment: (NOTE) SARS-CoV-2 target nucleic acids are NOT DETECTED. The SARS-CoV-2 RNA is generally  detectable in upper respiratoy specimens during the acute phase of infection. The lowest concentration of SARS-CoV-2 viral copies this assay can detect is 131 copies/mL. A negative result does not preclude SARS-Cov-2 infection and should not be used as the sole basis for treatment or other patient management decisions. A negative result may occur with  improper specimen collection/handling, submission of specimen other than nasopharyngeal swab, presence of viral mutation(s) within the areas targeted by this assay, and inadequate number of viral  copies (<131 copies/mL). A negative result must be combined with clinical observations, patient history, and epidemiological information. The expected result is Negative. Fact Sheet for Patients:  PinkCheek.be Fact Sheet for Healthcare Providers:  GravelBags.it This test is not yet ap proved or cleared by the [place] FDA and  has been authorized for detection and/or diagnosis of SARS-CoV-2 by FDA under an Emergency Use Authorization (EUA). This EUA will remain  in effect (meaning this test can be used) for the duration of the COVID-19 declaration under Section 564(b)(1) of the Act, 21 U.S.C. section 360bbb-3(b)(1), unless the authorization is terminated or revoked sooner.    Influenza A by PCR NEGATIVE NEGATIVE   Influenza B by PCR NEGATIVE NEGATIVE    Comment: (NOTE) The Xpert Xpress SARS-CoV-2/FLU/RSV assay is intended as an aid in  the diagnosis of influenza from Nasopharyngeal swab specimens and  should not be used as a sole basis for treatment. Nasal washings and  aspirates are unacceptable for Xpert Xpress SARS-CoV-2/FLU/RSV  testing. Fact Sheet for Patients: PinkCheek.be Fact Sheet for Healthcare Providers: GravelBags.it This test is not yet approved or cleared by the [place] FDA and  has been authorized for detection and/or diagnosis of SARS-CoV-2 by  FDA under an Emergency Use Authorization (EUA). This EUA will remain  in effect (meaning this test can be used) for the duration of the  Covid-19 declaration under Section 564(b)(1) of the Act, 21  U.S.C. section 360bbb-3(b)(1), unless the authorization is  terminated or revoked. Performed at [facility], [place] [address], [place], [place] [address]   CBC with Differential     Status: Abnormal   Collection Time: 03/30/19  2:51 PM  Result Value Ref Range   WBC 28.6 (H) 4.0 - 10.5 K/uL   RBC  5.00 4.22 - 5.81 MIL/uL   Hemoglobin 15.2 13.0 - 17.0 g/dL   HCT 45.7 39.0 - 52.0 %   MCV 91.4 80.0 - 100.0 fL   MCH 30.4 26.0 - 34.0 pg   MCHC 33.3 30.0 - 36.0 g/dL   RDW 12.3 11.5 - 15.5 %   Platelets 454 (H) 150 - 400 K/uL   nRBC 0.0 0.0 - 0.2 %   Neutrophils Relative % 89 %   Neutro Abs 25.5 (H) 1.7 - 7.7 K/uL   Lymphocytes Relative 6 %   Lymphs Abs 1.7 0.7 - 4.0 K/uL   Monocytes Relative 3 %   Monocytes Absolute 0.9 0.1 - 1.0 K/uL   Eosinophils Relative 0 %   Eosinophils Absolute 0.0 0.0 - 0.5 K/uL   Basophils Relative 0 %   Basophils Absolute 0.0 0.0 - 0.1 K/uL   nRBC 0 0 /100 WBC   Metamyelocytes Relative 2 %   Abs Immature Granulocytes 0.60 (H) 0.00 - 0.07 K/uL    Comment: Performed at [facility] [address], [place], [place] [address]  I-stat chem 8, ED (not at [facility] or [facility])     Status: Abnormal   Collection Time: 03/30/19  2:56 PM  Result Value Ref Range   Sodium 118 (LL) 135 - 145 mmol/L   Potassium 8.0 (HH) 3.5 - 5.1 mmol/L   Chloride 90 (L) 98 - 111 mmol/L   BUN 102 (H) 6 - 20 mg/dL   Creatinine, Ser 3.50 (H) 0.61 - 1.24 mg/dL   Glucose, Bld >700 (HH) 70 - 99 mg/dL    Comment: Glucose reference range applies only to samples taken after fasting for at least 8 hours.   Calcium, Ion 1.04 (L) 1.15 - 1.40 mmol/L   TCO2 22 22 - 32 mmol/L   Hemoglobin 16.7 13.0 - 17.0 g/dL   HCT 49.0 39.0 - 52.0 %   Comment NOTIFIED PHYSICIAN   POCT I-Stat EG7     Status: Abnormal   Collection Time: 03/30/19  2:56 PM  Result Value Ref Range   pH, Ven 7.354 7.250 - 7.430   pCO2, Ven 39.5 (L) 44.0 - 60.0 mmHg   pO2, Ven 35.0 32.0 - 45.0 mmHg   Bicarbonate 22.0 20.0 - 28.0 mmol/L   TCO2 23 22 - 32 mmol/L   O2 Saturation 65.0 %   Acid-base deficit 3.0 (H) 0.0 - 2.0 mmol/L   Sodium 118 (LL) 135 - 145 mmol/L   Potassium 7.9 (HH) 3.5 - 5.1 mmol/L   Calcium, Ion 1.04 (L) 1.15 - 1.40 mmol/L   HCT 48.0 39.0 - 52.0 %   Hemoglobin 16.3 13.0 - 17.0 g/dL   Patient temperature  HIDE    Sample type VENOUS    Comment NOTIFIED PHYSICIAN   Ammonia     Status: None   Collection Time: 03/30/19  3:12 PM  Result Value Ref Range   Ammonia 31 9 - 35 umol/L    Comment: Performed at [facility], [place] [address] [place], [place] [address]    DG Chest Portable 1 View  Result Date: 03/30/2019 CLINICAL DATA:  Chest pain and shortness of breath EXAM: PORTABLE CHEST 1 VIEW COMPARISON:  March 23, 2018 FINDINGS: Heart is enlarged with pulmonary venous hypertension. There is interstitial edema. No appreciable consolidation. No pleural effusions evident. No adenopathy. Stimulator leads are in the lower thoracic region, stable. No pneumothorax. No bone lesions. IMPRESSION: Cardiomegaly with pulmonary vascular congestion. Interstitial edema. Appearance indicative of congestive heart failure. No consolidation. Electronically Signed   By: Lowella Grip III M.D.   On: 03/30/2019 15:08    Assessment/Plan **Hyperkalemia:  Life threatening. In need of urgent RRT to correct.  Has been medically temporized in the meantime.  Being admitted to ICU.  Orders placed for CRRT with 0K dialysate.  Serial electrolytes   **AKI on CKD:  Per report baseline 3, now 3.5.  Check UA, urine lytes, renal US.  Obtain outside records when able.    **Hyperglycemia:  ABG with normal pH, oddly no serum bicarb reported on CMP right now.  Insulin per primary and CRRT will help too.    **hypoxic resp failure: edema on CXR, fluid removal with CRRT tonight.    **Hyponatremia: corrects to nearly normal for BG.  May have small component of hypervolemic hyponatremia.  **Sepsis:  WBC 30,  Blood cultures pending. Antibiotics per primary.   **CAD: cardiology following; EKG changes likely metabolic and not STEMI.   Call me with questions concerns.  Justin Mend 03/30/2019, 3:46 PM

## 2019-03-31 ENCOUNTER — Inpatient Hospital Stay (HOSPITAL_COMMUNITY): Payer: Medicare Other

## 2019-03-31 ENCOUNTER — Other Ambulatory Visit (HOSPITAL_COMMUNITY): Payer: Medicare Other

## 2019-03-31 ENCOUNTER — Encounter (HOSPITAL_COMMUNITY): Payer: Medicare Other

## 2019-03-31 ENCOUNTER — Inpatient Hospital Stay (HOSPITAL_COMMUNITY)
Admission: EM | Disposition: A | Discharge status: Home Health | Payer: Self-pay | Source: Home / Self Care | Attending: Internal Medicine

## 2019-03-31 DIAGNOSIS — R609 Edema, unspecified: Secondary | ICD-10-CM

## 2019-03-31 DIAGNOSIS — I2510 Atherosclerotic heart disease of native coronary artery without angina pectoris: Secondary | ICD-10-CM

## 2019-03-31 DIAGNOSIS — R079 Chest pain, unspecified: Secondary | ICD-10-CM

## 2019-03-31 DIAGNOSIS — J810 Acute pulmonary edema: Secondary | ICD-10-CM

## 2019-03-31 DIAGNOSIS — I2119 ST elevation (STEMI) myocardial infarction involving other coronary artery of inferior wall: Principal | ICD-10-CM

## 2019-03-31 DIAGNOSIS — J9601 Acute respiratory failure with hypoxia: Secondary | ICD-10-CM

## 2019-03-31 DIAGNOSIS — N170 Acute kidney failure with tubular necrosis: Secondary | ICD-10-CM

## 2019-03-31 DIAGNOSIS — N1831 Chronic kidney disease, stage 3a: Secondary | ICD-10-CM

## 2019-03-31 HISTORY — PX: LEFT HEART CATH AND CORONARY ANGIOGRAPHY: CATH118249

## 2019-03-31 LAB — BASIC METABOLIC PANEL
Anion gap: 12 (ref 5–15)
Anion gap: 8 (ref 5–15)
BUN: 88 mg/dL — ABNORMAL HIGH (ref 6–20)
BUN: 69 mg/dL — ABNORMAL HIGH (ref 6–20)
CO2: 22 mmol/L (ref 22–32)
CO2: 23 mmol/L (ref 22–32)
Calcium: 8.4 mg/dL — ABNORMAL LOW (ref 8.9–10.3)
Calcium: 8.6 mg/dL — ABNORMAL LOW (ref 8.9–10.3)
Chloride: 101 mmol/L (ref 98–111)
Chloride: 105 mmol/L (ref 98–111)
Creatinine, Ser: 2.33 mg/dL — ABNORMAL HIGH (ref 0.61–1.24)
Creatinine, Ser: 1.78 mg/dL — ABNORMAL HIGH (ref 0.61–1.24)
GFR calc Af Amer: 35 mL/min — ABNORMAL LOW (ref >60)
GFR calc Af Amer: 49 mL/min — ABNORMAL LOW (ref >60)
GFR calc non Af Amer: 31 mL/min — ABNORMAL LOW (ref >60)
GFR calc non Af Amer: 42 mL/min — ABNORMAL LOW (ref >60)
Glucose, Bld: 203 mg/dL — ABNORMAL HIGH (ref 70–99)
Glucose, Bld: 195 mg/dL — ABNORMAL HIGH (ref 70–99)
Potassium: 5.6 mmol/L — ABNORMAL HIGH (ref 3.5–5.1)
Potassium: 5.2 mmol/L — ABNORMAL HIGH (ref 3.5–5.1)
Sodium: 135 mmol/L (ref 135–145)
Sodium: 136 mmol/L (ref 135–145)

## 2019-03-31 LAB — RAPID URINE DRUG SCREEN, HOSP PERFORMED
Amphetamines: NOT DETECTED
Barbiturates: NOT DETECTED
Benzodiazepines: NOT DETECTED
Cocaine: NOT DETECTED
Opiates: POSITIVE — AB
Tetrahydrocannabinol: NOT DETECTED

## 2019-03-31 LAB — RENAL FUNCTION PANEL
Albumin: 2.5 g/dL — ABNORMAL LOW (ref 3.5–5.0)
Albumin: 2.6 g/dL — ABNORMAL LOW (ref 3.5–5.0)
Anion gap: 10 (ref 5–15)
Anion gap: 13 (ref 5–15)
BUN: 89 mg/dL — ABNORMAL HIGH (ref 6–20)
BUN: 81 mg/dL — ABNORMAL HIGH (ref 6–20)
CO2: 25 mmol/L (ref 22–32)
CO2: 21 mmol/L — ABNORMAL LOW (ref 22–32)
Calcium: 8.9 mg/dL (ref 8.9–10.3)
Calcium: 8.7 mg/dL — ABNORMAL LOW (ref 8.9–10.3)
Chloride: 98 mmol/L (ref 98–111)
Chloride: 102 mmol/L (ref 98–111)
Creatinine, Ser: 2.6 mg/dL — ABNORMAL HIGH (ref 0.61–1.24)
Creatinine, Ser: 2.06 mg/dL — ABNORMAL HIGH (ref 0.61–1.24)
GFR calc Af Amer: 31 mL/min — ABNORMAL LOW (ref >60)
GFR calc Af Amer: 41 mL/min — ABNORMAL LOW (ref >60)
GFR calc non Af Amer: 27 mL/min — ABNORMAL LOW (ref >60)
GFR calc non Af Amer: 35 mL/min — ABNORMAL LOW (ref >60)
Glucose, Bld: 235 mg/dL — ABNORMAL HIGH (ref 70–99)
Glucose, Bld: 154 mg/dL — ABNORMAL HIGH (ref 70–99)
Phosphorus: 4.4 mg/dL (ref 2.5–4.6)
Phosphorus: 3.7 mg/dL (ref 2.5–4.6)
Potassium: 5.5 mmol/L — ABNORMAL HIGH (ref 3.5–5.1)
Potassium: 5.8 mmol/L — ABNORMAL HIGH (ref 3.5–5.1)
Sodium: 133 mmol/L — ABNORMAL LOW (ref 135–145)
Sodium: 136 mmol/L (ref 135–145)

## 2019-03-31 LAB — POCT ACTIVATED CLOTTING TIME
Activated Clotting Time: 114 seconds
Activated Clotting Time: 208 seconds

## 2019-03-31 LAB — MAGNESIUM: Magnesium: 2.4 mg/dL (ref 1.7–2.4)

## 2019-03-31 LAB — CBC
HCT: 42.5 % (ref 39.0–52.0)
Hemoglobin: 14.8 g/dL (ref 13.0–17.0)
MCH: 31 pg (ref 26.0–34.0)
MCHC: 34.8 g/dL (ref 30.0–36.0)
MCV: 88.9 fL (ref 80.0–100.0)
Platelets: 364 10*3/uL (ref 150–400)
RBC: 4.78 MIL/uL (ref 4.22–5.81)
RDW: 12.5 % (ref 11.5–15.5)
WBC: 33.8 10*3/uL — ABNORMAL HIGH (ref 4.0–10.5)
nRBC: 0 % (ref 0.0–0.2)

## 2019-03-31 LAB — GLUCOSE, CAPILLARY
Glucose-Capillary: 139 mg/dL — ABNORMAL HIGH (ref 70–99)
Glucose-Capillary: 178 mg/dL — ABNORMAL HIGH (ref 70–99)
Glucose-Capillary: 182 mg/dL — ABNORMAL HIGH (ref 70–99)
Glucose-Capillary: 186 mg/dL — ABNORMAL HIGH (ref 70–99)
Glucose-Capillary: 193 mg/dL — ABNORMAL HIGH (ref 70–99)
Glucose-Capillary: 193 mg/dL — ABNORMAL HIGH (ref 70–99)
Glucose-Capillary: 198 mg/dL — ABNORMAL HIGH (ref 70–99)
Glucose-Capillary: 201 mg/dL — ABNORMAL HIGH (ref 70–99)
Glucose-Capillary: 202 mg/dL — ABNORMAL HIGH (ref 70–99)
Glucose-Capillary: 203 mg/dL — ABNORMAL HIGH (ref 70–99)
Glucose-Capillary: 207 mg/dL — ABNORMAL HIGH (ref 70–99)
Glucose-Capillary: 211 mg/dL — ABNORMAL HIGH (ref 70–99)
Glucose-Capillary: 223 mg/dL — ABNORMAL HIGH (ref 70–99)
Glucose-Capillary: 224 mg/dL — ABNORMAL HIGH (ref 70–99)
Glucose-Capillary: 224 mg/dL — ABNORMAL HIGH (ref 70–99)
Glucose-Capillary: 248 mg/dL — ABNORMAL HIGH (ref 70–99)
Glucose-Capillary: 256 mg/dL — ABNORMAL HIGH (ref 70–99)
Glucose-Capillary: 262 mg/dL — ABNORMAL HIGH (ref 70–99)
Glucose-Capillary: 272 mg/dL — ABNORMAL HIGH (ref 70–99)
Glucose-Capillary: 275 mg/dL — ABNORMAL HIGH (ref 70–99)
Glucose-Capillary: 292 mg/dL — ABNORMAL HIGH (ref 70–99)
Glucose-Capillary: 308 mg/dL — ABNORMAL HIGH (ref 70–99)

## 2019-03-31 LAB — HEMOGLOBIN A1C
Hgb A1c MFr Bld: 12 % — ABNORMAL HIGH (ref 4.8–5.6)
Mean Plasma Glucose: 298 mg/dL

## 2019-03-31 LAB — URINALYSIS, ROUTINE W REFLEX MICROSCOPIC
Bacteria, UA: NONE SEEN
Bilirubin Urine: NEGATIVE
Glucose, UA: 150 mg/dL — AB
Ketones, ur: NEGATIVE mg/dL
Leukocytes,Ua: NEGATIVE
Nitrite: NEGATIVE
Protein, ur: NEGATIVE mg/dL
Specific Gravity, Urine: 1.011 (ref 1.005–1.030)
pH: 5 (ref 5.0–8.0)

## 2019-03-31 LAB — APTT: aPTT: 28 seconds (ref 24–36)

## 2019-03-31 LAB — ECHOCARDIOGRAM COMPLETE
Height: 67 in
Weight: 5030.02 oz

## 2019-03-31 LAB — PROCALCITONIN: Procalcitonin: 0.53 ng/mL

## 2019-03-31 LAB — OSMOLALITY: Osmolality: 323 mOsm/kg (ref 275–295)

## 2019-03-31 LAB — PHOSPHORUS: Phosphorus: 4.3 mg/dL (ref 2.5–4.6)

## 2019-03-31 LAB — CREATININE, URINE, RANDOM: Creatinine, Urine: 53.47 mg/dL

## 2019-03-31 LAB — SODIUM, URINE, RANDOM: Sodium, Ur: 56 mmol/L

## 2019-03-31 SURGERY — LEFT HEART CATH AND CORONARY ANGIOGRAPHY
Anesthesia: LOCAL

## 2019-03-31 MED ORDER — INSULIN ASPART 100 UNIT/ML ~~LOC~~ SOLN
3.0000 [IU] | SUBCUTANEOUS | Status: DC
  Administered 2019-03-31: 6 [IU] via SUBCUTANEOUS

## 2019-03-31 MED ORDER — MIDAZOLAM HCL 2 MG/2ML IJ SOLN
INTRAMUSCULAR | Status: DC | PRN
  Administered 2019-03-31: 2 mg via INTRAVENOUS

## 2019-03-31 MED ORDER — IOHEXOL 350 MG/ML SOLN
INTRAVENOUS | Status: DC | PRN
  Administered 2019-03-31: 50 mL

## 2019-03-31 MED ORDER — HEPARIN SODIUM (PORCINE) 1000 UNIT/ML IJ SOLN
INTRAMUSCULAR | Status: AC
  Filled 2019-03-31: qty 1

## 2019-03-31 MED ORDER — ACETAMINOPHEN 325 MG PO TABS
650.0000 mg | ORAL_TABLET | ORAL | Status: DC | PRN
  Administered 2019-04-01: 650 mg via ORAL
  Filled 2019-03-31: qty 2

## 2019-03-31 MED ORDER — MIDAZOLAM HCL 2 MG/2ML IJ SOLN
INTRAMUSCULAR | Status: AC
  Filled 2019-03-31: qty 2

## 2019-03-31 MED ORDER — SODIUM CHLORIDE 0.9 % IV SOLN: INTRAVENOUS | Status: AC

## 2019-03-31 MED ORDER — VERAPAMIL HCL 2.5 MG/ML IV SOLN
INTRAVENOUS | Status: DC | PRN
  Administered 2019-03-31: 09:00:00 10 mL via INTRA_ARTERIAL

## 2019-03-31 MED ORDER — PERFLUTREN LIPID MICROSPHERE
1.0000 mL | INTRAVENOUS | Status: AC | PRN
  Administered 2019-03-31: 3 mL via INTRAVENOUS
  Filled 2019-03-31: qty 10

## 2019-03-31 MED ORDER — DEXTROSE 50 % IV SOLN: 0.0000 mL | INTRAVENOUS | Status: DC | PRN

## 2019-03-31 MED ORDER — INSULIN ASPART 100 UNIT/ML ~~LOC~~ SOLN
8.0000 [IU] | SUBCUTANEOUS | Status: DC
  Administered 2019-03-31: 8 [IU] via SUBCUTANEOUS

## 2019-03-31 MED ORDER — DEXTROSE-NACL 5-0.45 % IV SOLN: INTRAVENOUS | Status: DC

## 2019-03-31 MED ORDER — SODIUM ZIRCONIUM CYCLOSILICATE 5 G PO PACK
10.0000 g | PACK | Freq: Two times a day (BID) | ORAL | Status: DC
  Administered 2019-03-31 – 2019-04-01 (×2): 10 g via ORAL
  Filled 2019-03-31 (×2): qty 2

## 2019-03-31 MED ORDER — SODIUM CHLORIDE 0.9 % IV SOLN: 250.0000 mL | INTRAVENOUS | Status: DC | PRN

## 2019-03-31 MED ORDER — FENTANYL CITRATE (PF) 100 MCG/2ML IJ SOLN
INTRAMUSCULAR | Status: DC | PRN
  Administered 2019-03-31: 25 ug via INTRAVENOUS

## 2019-03-31 MED ORDER — FENTANYL CITRATE (PF) 100 MCG/2ML IJ SOLN
INTRAMUSCULAR | Status: AC
  Filled 2019-03-31: qty 2

## 2019-03-31 MED ORDER — SODIUM CHLORIDE 0.9 % IV SOLN: INTRAVENOUS | Status: DC

## 2019-03-31 MED ORDER — HEPARIN (PORCINE) IN NACL 1000-0.9 UT/500ML-% IV SOLN
INTRAVENOUS | Status: DC | PRN
  Administered 2019-03-31 (×2): 500 mL

## 2019-03-31 MED ORDER — NITROGLYCERIN 1 MG/10 ML FOR IR/CATH LAB
INTRA_ARTERIAL | Status: AC
  Filled 2019-03-31: qty 10

## 2019-03-31 MED ORDER — HEPARIN BOLUS VIA INFUSION
2000.0000 [IU] | Freq: Once | INTRAVENOUS | Status: AC
  Administered 2019-03-31: 2000 [IU] via INTRAVENOUS
  Filled 2019-03-31: qty 2000

## 2019-03-31 MED ORDER — DEXTROSE 10 % IV SOLN: INTRAVENOUS | Status: DC | PRN

## 2019-03-31 MED ORDER — SODIUM CHLORIDE 0.9% FLUSH: 3.0000 mL | INTRAVENOUS | Status: DC | PRN

## 2019-03-31 MED ORDER — HEPARIN (PORCINE) IN NACL 1000-0.9 UT/500ML-% IV SOLN
INTRAVENOUS | Status: AC
  Filled 2019-03-31: qty 1000

## 2019-03-31 MED ORDER — HEPARIN SODIUM (PORCINE) 1000 UNIT/ML IJ SOLN
2800.0000 [IU] | Freq: Once | INTRAMUSCULAR | Status: AC
  Administered 2019-03-31: 2800 [IU] via INTRAVENOUS

## 2019-03-31 MED ORDER — HEPARIN (PORCINE) 25000 UT/250ML-% IV SOLN
2000.0000 [IU]/h | INTRAVENOUS | Status: AC
  Administered 2019-03-31: 1400 [IU]/h via INTRAVENOUS
  Administered 2019-04-02: 1800 [IU]/h via INTRAVENOUS
  Filled 2019-03-31 (×3): qty 250

## 2019-03-31 MED ORDER — VERAPAMIL HCL 2.5 MG/ML IV SOLN
INTRAVENOUS | Status: AC
  Filled 2019-03-31: qty 2

## 2019-03-31 MED ORDER — HEPARIN SODIUM (PORCINE) 1000 UNIT/ML IJ SOLN
INTRAMUSCULAR | Status: DC | PRN
  Administered 2019-03-31: 10000 [IU] via INTRAVENOUS
  Administered 2019-03-31: 5000 [IU] via INTRAVENOUS

## 2019-03-31 MED ORDER — LIDOCAINE HCL (PF) 1 % IJ SOLN
INTRAMUSCULAR | Status: DC | PRN
  Administered 2019-03-31: 2 mL

## 2019-03-31 MED ORDER — SODIUM CHLORIDE 0.9% FLUSH
3.0000 mL | Freq: Two times a day (BID) | INTRAVENOUS | Status: DC
  Administered 2019-03-31 – 2019-04-05 (×6): 3 mL via INTRAVENOUS

## 2019-03-31 MED ORDER — HEPARIN (PORCINE) 25000 UT/250ML-% IV SOLN: 1400.0000 [IU]/h | INTRAVENOUS | Status: DC

## 2019-03-31 MED ORDER — ASPIRIN EC 81 MG PO TBEC
81.0000 mg | DELAYED_RELEASE_TABLET | Freq: Every day | ORAL | Status: DC
  Administered 2019-03-31 – 2019-04-07 (×8): 81 mg via ORAL
  Filled 2019-03-31 (×8): qty 1

## 2019-03-31 MED ORDER — SODIUM CHLORIDE 0.9% FLUSH
3.0000 mL | Freq: Two times a day (BID) | INTRAVENOUS | Status: DC
  Administered 2019-03-31 – 2019-04-07 (×10): 3 mL via INTRAVENOUS

## 2019-03-31 MED ORDER — MORPHINE SULFATE (PF) 2 MG/ML IV SOLN
2.0000 mg | INTRAVENOUS | Status: DC | PRN
  Administered 2019-03-31 – 2019-04-03 (×9): 2 mg via INTRAVENOUS
  Filled 2019-03-31 (×9): qty 1

## 2019-03-31 MED ORDER — SODIUM ZIRCONIUM CYCLOSILICATE 5 G PO PACK: 10.0000 g | PACK | Freq: Two times a day (BID) | ORAL | Status: DC

## 2019-03-31 MED ORDER — INSULIN DETEMIR 100 UNIT/ML ~~LOC~~ SOLN
24.0000 [IU] | Freq: Two times a day (BID) | SUBCUTANEOUS | Status: DC
  Administered 2019-03-31: 24 [IU] via SUBCUTANEOUS
  Filled 2019-03-31 (×2): qty 0.24

## 2019-03-31 MED ORDER — CLOPIDOGREL BISULFATE 75 MG PO TABS
75.0000 mg | ORAL_TABLET | Freq: Every day | ORAL | Status: DC
  Administered 2019-04-01 – 2019-04-07 (×7): 75 mg via ORAL
  Filled 2019-03-31 (×7): qty 1

## 2019-03-31 MED ORDER — LIDOCAINE HCL (PF) 1 % IJ SOLN
INTRAMUSCULAR | Status: AC
  Filled 2019-03-31: qty 30

## 2019-03-31 MED ORDER — VANCOMYCIN HCL 10 G IV SOLR
2500.0000 mg | Freq: Once | INTRAVENOUS | Status: DC
  Administered 2019-03-31: 2500 mg via INTRAVENOUS
  Filled 2019-03-31: qty 2500

## 2019-03-31 MED ORDER — INSULIN REGULAR(HUMAN) IN NACL 100-0.9 UT/100ML-% IV SOLN
INTRAVENOUS | Status: DC
  Administered 2019-03-31: 13 [IU]/h via INTRAVENOUS
  Filled 2019-03-31 (×2): qty 100

## 2019-03-31 MED ORDER — CLOPIDOGREL BISULFATE 300 MG PO TABS
300.0000 mg | ORAL_TABLET | Freq: Once | ORAL | Status: AC
  Administered 2019-03-31: 300 mg via ORAL
  Filled 2019-03-31: qty 1

## 2019-03-31 MED ORDER — ONDANSETRON HCL 4 MG/2ML IJ SOLN
4.0000 mg | Freq: Four times a day (QID) | INTRAMUSCULAR | Status: DC | PRN
  Administered 2019-03-31 – 2019-04-02 (×4): 4 mg via INTRAVENOUS
  Filled 2019-03-31 (×4): qty 2

## 2019-03-31 MED ORDER — HEPARIN SODIUM (PORCINE) 1000 UNIT/ML IJ SOLN
2.8000 mL | Freq: Once | INTRAMUSCULAR | Status: AC
  Administered 2019-03-31: 2800 [IU]

## 2019-03-31 MED FILL — Medication: Qty: 1 | Status: AC

## 2019-03-31 SURGICAL SUPPLY — 19 items
CATH LAUNCHER 6FR AL1 (CATHETERS) IMPLANT
CATH LAUNCHER 6FR AR1 (CATHETERS) ×1 IMPLANT
CATH OPTITORQUE TIG 4.0 5F (CATHETERS) ×1 IMPLANT
CATH VISTA GUIDE 6FR JR4 (CATHETERS) ×1 IMPLANT
CATHETER LAUNCHER 6FR AL1 (CATHETERS) ×2
DEVICE RAD COMP TR BAND LRG (VASCULAR PRODUCTS) ×1 IMPLANT
ELECT DEFIB PAD ADLT CADENCE (PAD) ×1 IMPLANT
GLIDESHEATH SLEND SS 6F .021 (SHEATH) ×2 IMPLANT
GUIDEWIRE INQWIRE 1.5J.035X260 (WIRE) IMPLANT
HOVERMATT SINGLE USE (MISCELLANEOUS) ×2 IMPLANT
INQWIRE 1.5J .035X260CM (WIRE) ×2
KIT ENCORE 26 ADVANTAGE (KITS) ×1 IMPLANT
KIT HEART LEFT (KITS) ×2 IMPLANT
PACK CARDIAC CATHETERIZATION (CUSTOM PROCEDURE TRAY) ×2 IMPLANT
TRANSDUCER W/STOPCOCK (MISCELLANEOUS) ×2 IMPLANT
TUBING ART PRESS 72  MALE/FEM (TUBING) ×2
TUBING ART PRESS 72 MALE/FEM (TUBING) IMPLANT
TUBING CIL FLEX 10 FLL-RA (TUBING) ×2 IMPLANT
WIRE ASAHI PROWATER 180CM (WIRE) ×1 IMPLANT

## 2019-03-31 NOTE — Progress Notes (Addendum)
ANTICOAGULATION CONSULT NOTE - Initial Consult  Pharmacy Consult for heparin  Indication: chest pain/ACS  Allergies  Allergen Reactions  . Other Nausea And Vomiting    Mayonnaise      Patient Measurements: Height: 5\' 7"  (170.2 cm) Weight: (!) 314 lb 6 oz (142.6 kg) IBW/kg (Calculated) : 66.1 Heparin Dosing Weight: 100kg  Vital Signs: Temp: 97.6 F (36.4 C) (03/24 0752) Temp Source: Oral (03/24 0752) BP: 113/79 (03/24 0730) Pulse Rate: 103 (03/24 0730)  Labs: Recent Labs    03/30/19 1451 03/30/19 1451 03/30/19 1456 03/30/19 1456 03/30/19 1813 03/30/19 1824 03/30/19 1942 03/30/19 2209 03/31/19 0222 03/31/19 0308 03/31/19 0411  HGB 15.2   < > 16.7  16.3   < >  --  15.6  --   --  14.8  --   --   HCT 45.7   < > 49.0  48.0  --   --  46.0  --   --  42.5  --   --   PLT 454*  --   --   --   --   --   --   --  364  --   --   APTT  --   --   --   --   --   --   --   --   --  28  --   CREATININE 3.82*   < > 3.50*   < > 3.18*  --   --  2.66*  --   --  2.60*  TROPONINIHS 2,304*  --   --   --  [id number]*  --  5,272*  --   --   --   --    < > = values in this interval not displayed.    Estimated Creatinine Clearance: 44.4 mL/min (A) (by C-G formula based on SCr of 2.6 mg/dL (H)).   Medical History: Past Medical History:  Diagnosis Date  . CVA (cerebral vascular accident) ([place])   . Gout   . Hypertension   . MGUS (monoclonal gammopathy of unknown significance)   . Obesity   . OSA on CPAP   . Pulmonary embolus ([place]) 05/2012   During recovery from MI, completed Coumadin  . STEMI (ST elevation myocardial infarction) ([place]) 04/2012   Integrity BMS RCA with minor left system disease    Medications:  Medications Prior to Admission  Medication Sig Dispense Refill Last Dose  . allopurinol (ZYLOPRIM) 300 MG tablet Take 300 mg by mouth daily.   03/28/2019  . amLODipine (NORVASC) 10 MG tablet Take 10 mg by mouth at bedtime.    03/29/2019 at Unknown time  . aspirin EC 81 MG tablet  Take 81 mg by mouth at bedtime.   03/29/2019 at Unknown time  . atorvastatin (LIPITOR) 10 MG tablet Take 10 mg by mouth at bedtime.    03/29/2019 at Unknown time  . colchicine 0.6 MG tablet Take 0.6 mg by mouth at bedtime as needed (gout).    Past Week at Unknown time  . cyclobenzaprine (FLEXERIL) 10 MG tablet Take 10 mg by mouth 2 (two) times daily as needed.   03/29/2019 at Unknown time  . doxazosin (CARDURA) 2 MG tablet Take 2 mg by mouth at bedtime.   03/29/2019 at Unknown time  . furosemide (LASIX) 20 MG tablet Take 20 mg by mouth daily as needed for fluid.    Past Week at Unknown time  . glimepiride (AMARYL) 1 MG tablet Take 1-2 mg by mouth  at bedtime.    03/29/2019 at Unknown time  . HYDROcodone-acetaminophen (NORCO/VICODIN) 5-325 MG tablet Take 1 tablet by mouth 2 (two) times daily as needed for pain.   03/30/2019 at Unknown time  . lisinopril (ZESTRIL) 10 MG tablet Take 10 mg by mouth daily.   03/29/2019 at Unknown time  . metoprolol succinate (TOPROL-XL) 100 MG 24 hr tablet Take 300 mg by mouth daily. Take with or immediately following a meal.   03/29/2019 at 2300  . morphine (MS CONTIN) 15 MG 12 hr tablet Take 15 mg by mouth 3 (three) times daily.   03/30/2019 at Unknown time  . naloxone (NARCAN) nasal spray 4 mg/0.1 mL Place 1 spray into the nose once.    unknown  . nitroGLYCERIN (NITROSTAT) 0.4 MG SL tablet Place 0.4 mg under the tongue every 5 (five) minutes as needed for chest pain.   unknown  . predniSONE (DELTASONE) 10 MG tablet Take 10 mg by mouth daily as needed.   Past Week at Unknown time  . pregabalin (LYRICA) 100 MG capsule Take 100 mg by mouth 3 (three) times daily.    03/30/2019 at Unknown time  . triamcinolone cream (KENALOG) 0.5 % Apply 1 application topically 3 (three) times daily. APPLY FOR 4 WEEKS STOP FOR 1 WEEK & REPEAT AS NEEDED   unknown  . aspirin EC 325 MG tablet TAKE 1 TABLET BY MOUTH EVERY DAY (Patient not taking: Reported on 03/30/2019) 30 tablet 0 Not Taking at Unknown  time   Scheduled:  . alteplase  4 mg Intracatheter STAT  . aspirin EC  81 mg Oral Daily  . Chlorhexidine Gluconate Cloth  6 each Topical Q0600  . heparin  5,000 Units Subcutaneous Q8H  . mupirocin ointment  1 application Nasal BID  . sodium zirconium cyclosilicate  10 g Oral Daily    Assessment: 54 yo male s/p CODE STEMI with troponin trend up to 5200. Pharmacy consulted to start heparin. Plans for cath 3/24 -hg= 14.8, plt= 364 -heparin sq given at 6am today  Goal of Therapy:  Heparin level 0.3-0.7 units/ml Monitor platelets by anticoagulation protocol: Yes   Plan:  -start heparin at 1400 units/hr -Will follow plans post cath  Hildred Laser, PharmD Clinical Pharmacist **Pharmacist phone directory can now be found on [email or web address] ([email or web address]).  Listed under [place].   Addendum -s/p cath with mid PDA occlusion for medical management -plans for 72 hours heparin -sheath removal at ~ 9:30am and TR band placed  Plan -start heparin at 1400 units/hr at 5:30pm -Heparin level in 8 hours and daily wth CBC daily -plans for 72 hours total (IV heparin was started post cath)  Hildred Laser, PharmD Clinical Pharmacist **Pharmacist phone directory can now be found on [email or web address] ([email or web address]).  Listed under [place].

## 2019-03-31 NOTE — H&P (View-Only) (Signed)
Progress Note  Patient Name: Jesus Cruz Date of Encounter: 03/31/2019  Primary Cardiologist: No primary care provider on file.   Subjective   Laying in bed, still having 8/10 chest pain. Reports pain most of the night. Sats remain stable on [place].   Inpatient Medications    Scheduled Meds: . alteplase  4 mg Intracatheter STAT  . Chlorhexidine Gluconate Cloth  6 each Topical Q0600  . heparin  5,000 Units Subcutaneous Q8H  . mupirocin ointment  1 application Nasal BID  . sodium zirconium cyclosilicate  10 g Oral Daily   Continuous Infusions: . sodium chloride Stopped (03/30/19 2328)  . ceFEPime (MAXIPIME) IV Stopped (03/30/19 1924)  . dextrose 5 % and 0.45% NaCl 75 mL/hr at 03/31/19 0600  . heparin 10,000 units/ 20 mL infusion syringe Stopped (03/30/19 2120)  . insulin 4 mL/hr at 03/31/19 0600  . prismasol BGK 0/2.5 Stopped (03/30/19 2115)  . prismasol BGK 0/2.5 Stopped (03/30/19 2115)  . prismasol BGK 0/2.5 Stopped (03/30/19 2115)  . vancomycin    . vancomycin     PRN Meds: dextrose, heparin, heparin   Vital Signs    Vitals:   03/31/19 0700 03/31/19 0715 03/31/19 0730 03/31/19 0752  BP: (!) 81/70 103/79 113/79   Pulse: (!) 106 (!) 111 (!) 103   Resp: (!) 23 17 13    Temp:    97.6 F (36.4 C)  TempSrc:    Oral  SpO2: 92% 92% 95%   Weight:      Height:        Intake/Output Summary (Last 24 hours) at 03/31/2019 0758 Last data filed at 03/31/2019 0700 Gross per 24 hour  Intake 1309.66 ml  Output 2440 ml  Net -1130.34 ml   Last 3 Weights 03/31/2019 03/30/2019 09/17/2013  Weight (lbs) 314 lb 6 oz 308 lb 10.3 oz 277 lb 14.4 oz  Weight (kg) 142.6 kg 140 kg 126.055 kg      Telemetry    ST - Personally Reviewed  ECG    ST with ST elevation in inferior leads- Personally Reviewed  Physical Exam  Pleasant younger obese WM, laying in bed. GEN: No acute distress, on [place]  Neck: No JVD Cardiac: Tachy, no murmurs, rubs, or gallops.  Respiratory: Course rhonchi GI:  Soft, nontender, non-distended  MS: 1+ pitting LE edema; No deformity. Neuro:  Nonfocal  Psych: Normal affect   Labs    High Sensitivity Troponin:   Recent Labs  Lab 03/30/19 1451 03/30/19 1813 03/30/19 1942  TROPONINIHS 2,304* 4,413* 5,272*      Chemistry Recent Labs  Lab 03/30/19 1451 03/30/19 1456 03/30/19 1813 03/30/19 1813 03/30/19 1824 03/30/19 2209 03/31/19 0411  NA 120*   < > 130*   < > 128* 131* 133*  K >7.5*   < > 6.3*   < > 6.2* 5.6* 5.5*  CL 83*   < > 91*  --   --  96* 98  CO2 19*   < > 23  --   --  22 25  GLUCOSE 857*   < > 531*  --   --  261* 235*  BUN 101*   < > 96*  --   --  91* 89*  CREATININE 3.82*   < > 3.18*  --   --  2.66* 2.60*  CALCIUM 8.6*   < > 9.7  --   --  8.7* 8.9  PROT 7.4  --   --   --   --   --   --  ALBUMIN 3.0*  --   --   --   --   --  2.5*  AST 40  --   --   --   --   --   --   ALT 37  --   --   --   --   --   --   ALKPHOS 224*  --   --   --   --   --   --   BILITOT 0.9  --   --   --   --   --   --   GFRNONAA 17*   < > 21*  --   --  26* 27*  GFRAA 19*   < > 24*  --   --  30* 31*  ANIONGAP 18*   < > 16*  --   --  13 10   < > = values in this interval not displayed.     Hematology Recent Labs  Lab 03/30/19 1451 03/30/19 1451 03/30/19 1456 03/30/19 1824 03/31/19 0222  WBC 28.6*  --   --   --  33.8*  RBC 5.00  --   --   --  4.78  HGB 15.2   < > 16.7  16.3 15.6 14.8  HCT 45.7   < > 49.0  48.0 46.0 42.5  MCV 91.4  --   --   --  88.9  MCH 30.4  --   --   --  31.0  MCHC 33.3  --   --   --  34.8  RDW 12.3  --   --   --  12.5  PLT 454*  --   --   --  364   < > = values in this interval not displayed.    BNP Recent Labs  Lab 03/30/19 1451 03/30/19 1813  BNP 88.7 549.1*     DDimer No results for input(s): DDIMER in the last 168 hours.   Radiology    US RENAL  Result Date: 03/30/2019 CLINICAL DATA:  Acute kidney injury EXAM: RENAL / URINARY TRACT ULTRASOUND COMPLETE COMPARISON:  CT abdomen 01/27/2017 FINDINGS:  Right Kidney: Renal measurements: 8.5 by 4.8 by 4.2 cm = volume: 90 mL . Echogenicity within normal limits. No mass or hydronephrosis visualized. Scarring in the right kidney most strikingly in the upper pole. Left Kidney: The left kidney cannot be visualized sonographically. Bladder: Not visualized sonographically. Other: Limiting factors included patient's sedation and patient body habitus. IMPRESSION: 1. Considerably atrophic right kidney. 2. Nonvisualization of the left kidney and urinary bladder. Electronically Signed   By: Van Clines M.D.   On: 03/30/2019 21:21   DG CHEST PORT 1 VIEW  Result Date: 03/30/2019 CLINICAL DATA:  Central line placement. Acute kidney injury. Hyperkalemia. Pulmonary edema. EXAM: PORTABLE CHEST 1 VIEW COMPARISON:  Chest x-rays dated 03/30/2019 and 03/23/2018 FINDINGS: Double lumen central venous catheter has been inserted. The tip is overlying the superior vena cava just below the carina in good position. Persistent pulmonary vascular congestion and mild perihilar haziness consistent with mild edema. Mild bibasilar atelectasis. No pneumothorax. No acute bone abnormality. Thoracic spinal cord stimulator in place. IMPRESSION: Central line in good position. No pneumothorax. Persistent pulmonary vascular congestion and mild perihilar edema. Electronically Signed   By: Lorriane Shire M.D.   On: 03/30/2019 18:38   DG Chest Portable 1 View  Result Date: 03/30/2019 CLINICAL DATA:  Chest pain and shortness of breath EXAM: PORTABLE CHEST 1 VIEW COMPARISON:  March 23, 2018 FINDINGS: Heart is enlarged with pulmonary venous hypertension. There is interstitial edema. No appreciable consolidation. No pleural effusions evident. No adenopathy. Stimulator leads are in the lower thoracic region, stable. No pneumothorax. No bone lesions. IMPRESSION: Cardiomegaly with pulmonary vascular congestion. Interstitial edema. Appearance indicative of congestive heart failure. No consolidation.  Electronically Signed   By: Lowella Grip III M.D.   On: 03/30/2019 15:08    Cardiac Studies   Echo: pending  Patient Profile     54 y.o. male with a hx of CAD s/p inf STEMI w/ Integrity BMS RCA 04/2012, DOE with negative CT-PE protocol, normal EF on echo, probable OHS, HTN, obesity, CVA, OSA on CPAP, Hx PE 05/2012 (completed coumadin), gout, MGUS, COPD, who was seen for the evaluation of possible STEMI/chest pain at the request of Dr Laverta Baltimore.  Assessment & Plan    1. NSTEMI with abnormal EKG/Chest pain: presented initially with EKG concerning for STEMI, but found to have K+ >7.5 and in ARF. STEMI cancelled as changes were felt to be metabolic realted. Planned for CRRT. hsTn 5272. Morning EKG shows continued ST changes in the inferior leads, and he says he has had chest pain throughout the night. Currently 8/10. Cr down to 2.6 this morning. Actually did not get CRRT as the line was not working. Has diuresed with IV lasix. Has been on sq heparin. Will plan to start IV heparin this morning. -- start ASA. Will need urgent cardiac cath this morning.  -- The patient understands that risks included but are not limited to stroke (1 in 1000), death (1 in 1000), kidney failure [usually temporary] (1 in 500), bleeding (1 in 200), allergic reaction [possibly serious] (1 in 200).  -- echo pending -- check lipids  2. Acute renal failure/Hyperkalemia: Cr 3.5 on admission, K+ >7.5. Planned for CRRT but line was not working overnight. Given IV lasix with diuresis. Cr down to 2.6 this morning, K+ 5.5. HD cath remains in place.   3. DM: Hgb A1c 12.0. On insulin drip with CBGs improved. Appears was suppose to be on glipizide prior to admission.   4. Leukocytosis: WBC 33.8, but no clear source noted. Remains afebrile. On Vanc per primary.   5. HTN: stable, home medications held on admission. Would favor adding BB post cath.   For questions or updates, please contact [facility] Please consult  [email or web address] for contact info under   Signed, Reino Bellis, NP  03/31/2019, 7:58 AM    I have personally seen and examined this patient. I agree with the assessment and plan as outlined above.  54 yo male with history of CKD, CAD with prior bare metal stenting of the RCA, HTN, sleep apnea, morbid obesity who was admitted yesterday with chest pain and found to be in DKA with potassium of 8 and worsened renal failure. His EKG yesterday showed a wide complex rhythm with some inferior ST elevation. Due to his renal failure, cardiac cath was delayed. He has continued to have chest pain through the night. His chest pain is 10/10 this am. EKG this am with 1-2 mm inferior ST elevation. Troponin over 5000. Creatinine 2.6. K 5.5. He has been on CRRT overnight. I have had a long discussion with him at the bedside along with PCCM, Dr. Lamonte Sakai. We both agree that cardiac cath is indicated. The patient is fully informed of the risk of worsened renal failure post dye load. He is willing to proceed with the cath. He will be taken urgently  to the cath lab now.   Lauree Chandler.  03/31/2019 8:24 AM

## 2019-03-31 NOTE — Progress Notes (Signed)
Insulin gtt to remain at 5 for 2 hours after levemir dose given per MD. Patient tolerating clear liquids and no major bleeding after cath lab. Patient reporting 8:10 chest pain when awake and coughing.

## 2019-03-31 NOTE — Progress Notes (Signed)
PCCM INTERVAL PROGRESS NOTE   Since placing foley and giving lasix, Mr Andrada has put out just shy of 2L clear yellow urine in about the past hour. Labs were drawn prior to lasix administration and show K down to 5.6 and serum creatinine down to 2.6. He has repeat labs ordered for 0300.   TPA remains in HD cath. I have asked RN to not start CRRT considering improvement in K, Cr, and urine output, considering difficulty with CRRT initiation thus far. Have discussed with Dr Mariane Masters. Will follow 0300 labs.     Georgann Housekeeper, AGACNP-BC [place]  See Amion for personal pager PCCM on call pager [phone]  03/31/2019 12:40 AM

## 2019-03-31 NOTE — Progress Notes (Signed)
eLink Physician-Brief Progress Note Patient Name: Dierre Braham DOB: [birth year] MRN: [id number]   Date of Service  03/31/2019  HPI/Events of Note  Blood glucose 350  eICU Interventions  Bedside RN requested to check a venous sample of blood to verify blood sugar, Insulin infusion ordered if BS > 300 is confirmed.        Frederik Pear 03/31/2019, 8:13 PM

## 2019-03-31 NOTE — Progress Notes (Signed)
Bilateral lower extremity venous duplex exam completed.  Preliminary results can be found under CV proc under chart review.  03/31/2019 2:38 PM  Zylen Wenig, K., RDMS, RVT

## 2019-03-31 NOTE — Progress Notes (Addendum)
  Echocardiogram 2D Echocardiogram has been performed with Definity.  Jesus Cruz 03/31/2019, 2:05 PM

## 2019-03-31 NOTE — Progress Notes (Signed)
NAME:  Danan Surprenant, MRN:  [id number], DOB:  1965/03/11, LOS: 1 ADMISSION DATE:  03/30/2019, CONSULTATION DATE:  03/30/2019 REFERRING MD:  Dr. Laverta Baltimore, CHIEF COMPLAINT:  HHS with hyperkalemia    Brief History   54 year old male admitted with complaints of chest pain as a code STEMI found to have hyperkalemia and acute renal failure PCCM consulted for admission  History of present illness   Exton Suhre is a 54 year old male with a past medical history significant for CAD, hypertension, and prior CVA who presented via EMS with complaints of chest pain as a code STEMI.  On arrival patient was seen diaphoretic and with complaints of shortness of breath. Cardiology evaluated on admission with recommendations of treatment for hyperkalemia and renal failure prior to taking patient to the Cath Lab.  Complete lab work currently pending however initial laboratory abnormalities include sodium of 118, potassium of 8, glucose greater than 700, creatinine 3.50 and WBC 28.6. EKG with tall peaked T-wave.   Nephrology consulted on admission and reccommended CRRT for acute renal failure and hyperkalemia.   PCCM consulted for admission  Past Medical History  STEMI in 2014 PE OSA no CPAP  Obesity HTN GOUT CVA DM   Significant Hospital Events   Amitted 3/23  Consults:  Cardiology  Nephrology   Procedures:  Left IJ HD cath 3/23  Significant Diagnostic Tests:    Micro Data:  COVID 3/23 > Negative   Antimicrobials:  Vancomycin 3/23 >> 3/24 Cefepime 3/23 >>   Interim history/subjective:  CVVHD was deferred last night given significant urine output after Lasix, improvement in his potassium He had chest pain through the night EKG review with Dr. Angelena Form this morning concerning for ST elevations.   Objective   Blood pressure 113/80, pulse (!) 113, temperature 97.6 F (36.4 C), temperature source Oral, resp. rate (!) 46, height 5\' 7"  (1.702 m), weight (!) 142.6 kg, SpO2 (!) 0 %.         Intake/Output Summary (Last 24 hours) at 03/31/2019 1138 Last data filed at 03/31/2019 1103 Gross per 24 hour  Intake 1309.66 ml  Output 2890 ml  Net -1580.34 ml   Filed Weights   03/30/19 1513 03/31/19 0114  Weight: (!) 140 kg (!) 142.6 kg    Examination: General: Chronically ill-appearing man, laying in bed, no distress HEENT: Oropharynx moist, no upper airway noise Neuro: He is awake, somewhat dysarthric, likely due to overall weakness.  He follows commands, is well oriented, answers questions CV: Regular, distant, no murmur PULM: Decreased at both bases, no crackles, no wheezing GI: Obese, nondistended, positive bowel sounds Extremities: 1-2+ lower extremity edema Skin: No rash   Resolved Hospital Problem list     Assessment & Plan:   Acute renal failure on CKD Hyperkalemia  -Most recent Creatine on chart review was 1.03 06/2017, Creatinine 3.50 on admission. On admission patient stated creatine typically ranges in the 3's P: Appreciate nephrology assistance.  CVVH was deferred last night after good urine output with Lasix, stable potassium.  May be able to avoid.  I will defer to nephrology recommendations Follow urine output, BMP Avoid nephrotoxins.  Note anticipated dye load associated with planned left heart catheterization.  Discussed with him the risk for renal injury associated with this 3/24  Acute Hypoxic Respiratory insufficiency  -Secondary to volume overload  P: Wean oxygen as able Volume removal either with scheduled diuretics or CVVHD depending on nephrology recommendations Push pulmonary hygiene Follow chest x-ray intermittently  Chest pain with history of  CAD Prior STEMI 2014 Hyperlipidemia  P: Reviewed EKG with Dr. Angelena Form this morning.  Consistent with inferior ST elevations with reciprocal changes, possibly some anterior changes as well.  Risk-benefit discussed given his renal failure.  Ultimately recommendation made to proceed with left heart  catheterization, try to minimize contrast dye as able.  Discussed with the patient and he understands.  Plan is for catheterization this morning Telemetry monitoring Anticoagulation, antiplatelet medications depending on catheterization results Echocardiogram pending  Addendum > pt now back from L heart cath, CAD described, plan medical management.  Plavix loaded and continued, plan to start heparin drip 8 hours after sheath removal.  DM with HHS P: Currently on insulin infusion, begin to wean.  Anion gap closed, CO2 22 Should be able to transition off dextrose and transition to subcut insulin Follow BMP, CBG  Hypertension -Home medications include Norvasc, Cardura, Lisinopril, Metoprolol P:  Home meds on hold for now Plan restart per cardiology recommendations.   Leucocytosis, Pct 0.53 without any clear source of infection -WBC 28.6 on admission, afebrile. Likely reactive  P: Stop vancomycin on 3/24 Likely stop cefepime on 3/25 if he remains stable, cultures remain negative.  History of PE -Patient reports dyspnea on admission with decrease in activity recently. Dose no appear to be on long term anticoagulation at baseline P: Obtain lower extremity dopplers Consider Vq scan  Continuous telemetry   Best practice:  Diet: Carb modified diet Pain/Anxiety/Delirium protocol (if indicated): PRN's VAP protocol (if indicated): N/A DVT prophylaxis: Heparin infusion GI prophylaxis: PPI Glucose control: Insulin drip, plan transition to off on 3/24 Mobility:  Bedrest  Code Status: Full Family Communication: patient updated 3/24 Disposition: ICU  Labs   CBC: Recent Labs  Lab 03/30/19 1451 03/30/19 1456 03/30/19 1824 03/31/19 0222  WBC 28.6*  --   --  33.8*  NEUTROABS 25.5*  --   --   --   HGB 15.2 16.7  16.3 15.6 14.8  HCT 45.7 49.0  48.0 46.0 42.5  MCV 91.4  --   --  88.9  PLT 454*  --   --  123456    Basic Metabolic Panel: Recent Labs  Lab 03/30/19 1451  03/30/19 1451 03/30/19 1456 03/30/19 1456 03/30/19 1813 03/30/19 1824 03/30/19 2209 03/31/19 0222 03/31/19 0411 03/31/19 1023  NA 120*   < > 118*  118*   < > 130* 128* 131*  --  133* 135  K >7.5*   < > 8.0*  7.9*   < > 6.3* 6.2* 5.6*  --  5.5* 5.6*  CL 83*   < > 90*  --  91*  --  96*  --  98 101  CO2 19*  --   --   --  23  --  22  --  25 22  GLUCOSE 857*   < > >700*  --  531*  --  261*  --  235* 203*  BUN 101*   < > 102*  --  96*  --  91*  --  89* 88*  CREATININE 3.82*   < > 3.50*  --  3.18*  --  2.66*  --  2.60* 2.33*  CALCIUM 8.6*  --   --   --  9.7  --  8.7*  --  8.9 8.4*  MG  --   --   --   --  2.6*  --   --  2.4  --   --   PHOS  --   --   --   --  4.9*  --   --  4.3 4.4  --    < > = values in this interval not displayed.   GFR: Estimated Creatinine Clearance: 49.6 mL/min (A) (by C-G formula based on SCr of 2.33 mg/dL (H)). Recent Labs  Lab 03/30/19 1451 03/30/19 1456 03/30/19 1813 03/31/19 0222  PROCALCITON  --   --  0.46 0.53  WBC 28.6*  --   --  33.8*  LATICACIDVEN  --  3.4* 2.8*  --     Liver Function Tests: Recent Labs  Lab 03/30/19 1451 03/31/19 0411  AST 40  --   ALT 37  --   ALKPHOS 224*  --   BILITOT 0.9  --   PROT 7.4  --   ALBUMIN 3.0* 2.5*   Recent Labs  Lab 03/30/19 1451  LIPASE 40   Recent Labs  Lab 03/30/19 1512  AMMONIA 31    ABG    Component Value Date/Time   PHART 7.374 03/30/2019 1824   PCO2ART 44.1 03/30/2019 1824   PO2ART 83.0 03/30/2019 1824   HCO3 25.9 03/30/2019 1824   TCO2 27 03/30/2019 1824   ACIDBASEDEF 3.0 (H) 03/30/2019 1456   O2SAT 96.0 03/30/2019 1824     Coagulation Profile: No results for input(s): INR, PROTIME in the last 168 hours.  Cardiac Enzymes: No results for input(s): CKTOTAL, CKMB, CKMBINDEX, TROPONINI in the last 168 hours.  HbA1C: Hgb A1c MFr Bld  Date/Time Value Ref Range Status  03/30/2019 06:13 PM 12.0 (H) 4.8 - 5.6 % Final    Comment:    (NOTE)         Prediabetes: 5.7 - 6.4          Diabetes: >6.4         Glycemic control for adults with diabetes: <7.0   09/17/2013 04:30 AM 6.1 (H) <5.7 % Final    Comment:    (NOTE)                                                                       According to the ADA Clinical Practice Recommendations for 2011, when HbA1c is used as a screening test:  >=6.5%   Diagnostic of Diabetes Mellitus           (if abnormal result is confirmed) 5.7-6.4%   Increased risk of developing Diabetes Mellitus References:Diagnosis and Classification of Diabetes Mellitus,Diabetes [id number] 1):S62-S69 and Standards of Medical Care in         Diabetes - 2011,Diabetes [id number] (Suppl 1):S11-S61.    CBG: Recent Labs  Lab 03/31/19 0533 03/31/19 0632 03/31/19 0749 03/31/19 0850 03/31/19 0954  GLUCAP 203* 207* 224* 193* 211*     Critical care time: 35 min   Baltazar Apo, MD, PhD 03/31/2019, 11:53 AM [facility] [phone] or if no answer [phone]

## 2019-03-31 NOTE — Consult Note (Signed)
WOC Nurse Consult Note: Reason for Consult: MASD and DM Foot ulcers Wound type: 1. Stage 2 Pressure injury; right medial heel 2. Cellulitis vs gout flare RLE 3. Neuropathic foot ulceration; superimposed callous  4. Moisture Associated Skin Damage (MASD) scrotum, under the pannus, groin, inner upper thighs  Pressure Injury POA: Yes Measurement: 1. 3cm x 2cm x 0cm  2. NA 3. 0.2cm x 0.2cm x 0cm  4. NA Wound bed: 1. Serous filled blister with some purple at the blister edges 2. NA 3.  Pin point;black 4. Erythematous, satellite lesions consistent with fugal overgrowth Drainage (amount, consistency, odor) none Periwound: intact  Dressing procedure/placement/frequency: Offload heels with Prevalon boots Silicone foam to protect  Suggested Diflucan to treat fungal ITD Antimicrobial wicking fabric for MASD  Discussed POC with patient and bedside nurse.  Re consult if needed, will not follow at this time. Thanks  Kaylenn [employer], RN,CWOCN, CNS, [place] [phone])

## 2019-03-31 NOTE — Interval H&P Note (Signed)
History and Physical Interval Note:  03/31/2019 8:32 AM  Jesus Cruz  has presented today for surgery, with the diagnosis of inferior STEMI (with acute on chronic renal failure).  The various methods of treatment have been discussed with the patient and family. After consideration of risks, benefits and other options for treatment, the patient has consented to  Procedure(s): LEFT HEART CATH AND CORONARY ANGIOGRAPHY (N/A)  PERCUTANEOUS CORONARY INTERVENTION  as a surgical intervention.  The patient's history has been reviewed, patient examined, no change in status, stable for surgery.  I have reviewed the patient's chart and labs.  Questions were answered to the patient's satisfaction.    Cath Lab Visit (complete for each Cath Lab visit)  Clinical Evaluation Leading to the Procedure:   ACS: Yes.    Non-ACS:    Anginal Classification: CCS IV  Anti-ischemic medical therapy: Maximal Therapy (2 or more classes of medications)  Non-Invasive Test Results: No non-invasive testing performed  Prior CABG: No previous CABG   Glenetta Hew

## 2019-03-31 NOTE — Progress Notes (Signed)
Cardiac MD, CCM and RN at bedside discussing plan of care with ongoing ST elevation and acute chest pain 8:10

## 2019-03-31 NOTE — Progress Notes (Signed)
[facility] Progress Note   Subjective:   Admitted to ICU yesterday - HD catheter placed but didn't work.  Foley placed and 2L UOP, given diuretics.  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  Normal LVEDP noted.  He says he feels somewhat improved but is still fatigued, not offering much info on interview.   Objective Vitals:   03/31/19 1245 03/31/19 1300 03/31/19 1315 03/31/19 1330  BP:  135/82  127/80  Pulse: (!) 101 (!) 103 (!) 105 (!) 108  Resp: 15 14 17 18   Temp:      TempSrc:      SpO2: 93% 94% 90% 91%  Weight:      Height:       Physical Exam General: obese man in no distress Heart: tachy, no rub Lungs: normal WOB, dec BS bases, no rales appreciated Abdomen: soft, obese Extremities: 1+ LE edema, ~ unchanged from yest Neuro:  Able to answer simple questions, seems too fatigued to talk Dialysis Access:  LIJ temp HD cath c/d/i  Additional Objective Labs: Basic Metabolic Panel: Recent Labs  Lab 03/30/19 1813 03/30/19 1824 03/30/19 2209 03/31/19 0222 03/31/19 0411 03/31/19 1023  NA 130*   < > 131*  --  133* 135  K 6.3*   < > 5.6*  --  5.5* 5.6*  CL 91*   < > 96*  --  98 101  CO2 23   < > 22  --  25 22  GLUCOSE 531*   < > 261*  --  235* 203*  BUN 96*   < > 91*  --  89* 88*  CREATININE 3.18*   < > 2.66*  --  2.60* 2.33*  CALCIUM 9.7   < > 8.7*  --  8.9 8.4*  PHOS 4.9*  --   --  4.3 4.4  --    < > = values in this interval not displayed.   Liver Function Tests: Recent Labs  Lab 03/30/19 1451 03/31/19 0411  AST 40  --   ALT 37  --   ALKPHOS 224*  --   BILITOT 0.9  --   PROT 7.4  --   ALBUMIN 3.0* 2.5*   Recent Labs  Lab 03/30/19 1451  LIPASE 40   CBC: Recent Labs  Lab 03/30/19 1451 03/30/19 1451 03/30/19 1456 03/30/19 1824 03/31/19 0222  WBC 28.6*  --   --   --  33.8*  NEUTROABS 25.5*  --   --   --   --   HGB 15.2   < > 16.7  16.3 15.6 14.8  HCT  45.7   < > 49.0  48.0 46.0 42.5  MCV 91.4  --   --   --  88.9  PLT 454*  --   --   --  364   < > = values in this interval not displayed.   Blood Culture    Component Value Date/Time   SDES BLOOD LEFT FOREARM 03/30/2019 1647   SPECREQUEST  03/30/2019 1647    BOTTLES DRAWN AEROBIC AND ANAEROBIC Blood Culture adequate volume   CULT  03/30/2019 1647    NO GROWTH < 24 HOURS Performed at [facility], [place] [address], [place], [place] [address]    REPTSTATUS PENDING 03/30/2019 1647    Cardiac Enzymes: No results for input(s): CKTOTAL, CKMB, CKMBINDEX, TROPONINI in the last 168 hours. CBG: Recent  Labs  Lab 03/31/19 0850 03/31/19 0954 03/31/19 1103 03/31/19 1208 03/31/19 1243  GLUCAP 193* 211* 223* 201* 202*   Iron Studies: No results for input(s): IRON, TIBC, TRANSFERRIN, FERRITIN in the last 72 hours. 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  Prox RCA lesion is 30% stenosed.  Previously placed Prox RCA to Mid RCA stent (unknown type) is widely patent.  Mid RCA lesion is 60% stenosed.  1st Diag/Ramus lesion is 40% stenosed.  Ramus/OM lesion is 50% stenosed.  1st Mrg/Distal LCx lesion is 60% stenosed.  LV end diastolic pressure is normal.  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  Does not appear to be true shock (likely related to sedation)  Hyperdynamic cardiac motion (very difficult to engage coronary arteries)  Large patulous left main with 4 major branches PLAN OF CARE:  Transfer back to MICU (2 M) -> we will restart IV heparin 8 hours after sheath  removal.  Would run IV heparin for total 72 hours including last night.  As needed pain meds  We will defer to consulting team, but could consider clopidogrel (we will load today)  We will defer management of renal failure to primary team/nephrology. Glenetta Hew, M.D., M.S. Interventional Cardiologist [address] [place], [place] [address]  US RENAL  Result Date: 03/30/2019 CLINICAL DATA:  Acute kidney injury EXAM: RENAL / URINARY TRACT ULTRASOUND COMPLETE COMPARISON:  CT abdomen 01/27/2017 FINDINGS: Right Kidney: Renal measurements: 8.5 by 4.8 by 4.2 cm = volume: 90 mL . Echogenicity within normal limits. No mass or hydronephrosis visualized. Scarring in the right kidney most strikingly in the upper pole. Left Kidney: The left kidney cannot be visualized sonographically. Bladder: Not visualized sonographically. Other: Limiting factors included patient's sedation and patient body habitus. IMPRESSION: 1. Considerably atrophic right kidney. 2. Nonvisualization of the left kidney and urinary bladder. Electronically Signed   By: Van Clines M.D.   On: 03/30/2019 21:21   DG CHEST PORT 1 VIEW  Result Date: 03/30/2019 CLINICAL DATA:  Central line placement. Acute kidney injury. Hyperkalemia. Pulmonary edema. EXAM: PORTABLE CHEST 1 VIEW COMPARISON:  Chest x-rays dated 03/30/2019 and 03/23/2018 FINDINGS: Double lumen central venous catheter has been inserted. The tip is overlying the superior vena cava just below the carina in good position. Persistent pulmonary vascular congestion and mild perihilar haziness consistent with mild edema. Mild bibasilar atelectasis. No pneumothorax. No acute bone abnormality. Thoracic spinal cord stimulator in place. IMPRESSION: Central line in good position. No pneumothorax. Persistent pulmonary vascular congestion and mild perihilar edema. Electronically Signed   By: Lorriane Shire M.D.   On: 03/30/2019 18:38   DG Chest Portable 1 View  Result Date:  03/30/2019 CLINICAL DATA:  Chest pain and shortness of breath EXAM: PORTABLE CHEST 1 VIEW COMPARISON:  March 23, 2018 FINDINGS: Heart is enlarged with pulmonary venous hypertension. There is interstitial edema. No appreciable consolidation. No pleural effusions evident. No adenopathy. Stimulator leads are in the lower thoracic region, stable. No pneumothorax. No bone lesions. IMPRESSION: Cardiomegaly with pulmonary vascular congestion. Interstitial edema. Appearance indicative of congestive heart failure. No consolidation. Electronically Signed   By: Gwyndolyn Saxon  Jasmine December III M.D.   On: 03/30/2019 15:08   Medications: . sodium chloride 500 mL/hr at 03/31/19 0906  . sodium chloride    . [START ON 04/01/2019] sodium chloride    . sodium chloride    . sodium chloride    . ceFEPime (MAXIPIME) IV 2 g (03/31/19 1244)  . dextrose 5 % and 0.45% NaCl 75 mL/hr at 03/31/19 0600  . heparin    . insulin 4 mL/hr at 03/31/19 0600   . alteplase  4 mg Intracatheter STAT  . aspirin EC  81 mg Oral Daily  . Chlorhexidine Gluconate Cloth  6 each Topical Q0600  . [START ON 04/01/2019] clopidogrel  75 mg Oral Daily  . mupirocin ointment  1 application Nasal BID  . sodium chloride flush  3 mL Intravenous Q12H  . sodium chloride flush  3 mL Intravenous Q12H  . sodium zirconium cyclosilicate  10 g Oral Daily   Assessment/Plan **Hyperkalemia:  Improving with diuresis and medical management.  Had minimal or no CRRT.  Hold on CRRT for now.  Cont diuresis and lokelma. PM electrolyte check.  **AKI on CKD:  Per report baseline 3, admission 3.5.  Bladder scan ? Error due to habitus --> had 2L when foley inserted and Cr improved to 2.3 today.  Renal US limited study -- R atrophic, L not visualized. UA with small blood, no protein after foley inserted; FeNa c/w intrinsic.  Holding on CRRT for now.  At fairly high risk for Contrast nephropathy in coming days so would keep HD catheter for now in case it's needed.    **DM:  Severe  hyperglycemia on arrival now improved on insulin gtt per primary  **hypoxic resp failure: Improving; had edema on CXR, diuresing.  **Hyponatremia: corrected to nearly normal for BG.  Improved.  **Sepsis:  WBC 30,  Blood cultures pending. Antibiotics per primary.   **CAD: s/p LHC this AM with PDA occulsion; med mgmt per cardiology. No ACEi/ARB for now.   Call me with questions concerns.  Jannifer Hick MD 03/31/2019, 2:03 PM  [facility] Pager: [phone]

## 2019-03-31 NOTE — Progress Notes (Addendum)
Progress Note  Patient Name: Jesus Cruz Date of Encounter: 03/31/2019  Primary Cardiologist: No primary care provider on file.   Subjective   Laying in bed, still having 8/10 chest pain. Reports pain most of the night. Sats remain stable on [place].   Inpatient Medications    Scheduled Meds: . alteplase  4 mg Intracatheter STAT  . Chlorhexidine Gluconate Cloth  6 each Topical Q0600  . heparin  5,000 Units Subcutaneous Q8H  . mupirocin ointment  1 application Nasal BID  . sodium zirconium cyclosilicate  10 g Oral Daily   Continuous Infusions: . sodium chloride Stopped (03/30/19 2328)  . ceFEPime (MAXIPIME) IV Stopped (03/30/19 1924)  . dextrose 5 % and 0.45% NaCl 75 mL/hr at 03/31/19 0600  . heparin 10,000 units/ 20 mL infusion syringe Stopped (03/30/19 2120)  . insulin 4 mL/hr at 03/31/19 0600  . prismasol BGK 0/2.5 Stopped (03/30/19 2115)  . prismasol BGK 0/2.5 Stopped (03/30/19 2115)  . prismasol BGK 0/2.5 Stopped (03/30/19 2115)  . vancomycin    . vancomycin     PRN Meds: dextrose, heparin, heparin   Vital Signs    Vitals:   03/31/19 0700 03/31/19 0715 03/31/19 0730 03/31/19 0752  BP: (!) 81/70 103/79 113/79   Pulse: (!) 106 (!) 111 (!) 103   Resp: (!) 23 17 13    Temp:    97.6 F (36.4 C)  TempSrc:    Oral  SpO2: 92% 92% 95%   Weight:      Height:        Intake/Output Summary (Last 24 hours) at 03/31/2019 0758 Last data filed at 03/31/2019 0700 Gross per 24 hour  Intake 1309.66 ml  Output 2440 ml  Net -1130.34 ml   Last 3 Weights 03/31/2019 03/30/2019 09/17/2013  Weight (lbs) 314 lb 6 oz 308 lb 10.3 oz 277 lb 14.4 oz  Weight (kg) 142.6 kg 140 kg 126.055 kg      Telemetry    ST - Personally Reviewed  ECG    ST with ST elevation in inferior leads- Personally Reviewed  Physical Exam  Pleasant younger obese WM, laying in bed. GEN: No acute distress, on [place]  Neck: No JVD Cardiac: Tachy, no murmurs, rubs, or gallops.  Respiratory: Course rhonchi GI:  Soft, nontender, non-distended  MS: 1+ pitting LE edema; No deformity. Neuro:  Nonfocal  Psych: Normal affect   Labs    High Sensitivity Troponin:   Recent Labs  Lab 03/30/19 1451 03/30/19 1813 03/30/19 1942  TROPONINIHS 2,304* 4,413* 5,272*      Chemistry Recent Labs  Lab 03/30/19 1451 03/30/19 1456 03/30/19 1813 03/30/19 1813 03/30/19 1824 03/30/19 2209 03/31/19 0411  NA 120*   < > 130*   < > 128* 131* 133*  K >7.5*   < > 6.3*   < > 6.2* 5.6* 5.5*  CL 83*   < > 91*  --   --  96* 98  CO2 19*   < > 23  --   --  22 25  GLUCOSE 857*   < > 531*  --   --  261* 235*  BUN 101*   < > 96*  --   --  91* 89*  CREATININE 3.82*   < > 3.18*  --   --  2.66* 2.60*  CALCIUM 8.6*   < > 9.7  --   --  8.7* 8.9  PROT 7.4  --   --   --   --   --   --  ALBUMIN 3.0*  --   --   --   --   --  2.5*  AST 40  --   --   --   --   --   --   ALT 37  --   --   --   --   --   --   ALKPHOS 224*  --   --   --   --   --   --   BILITOT 0.9  --   --   --   --   --   --   GFRNONAA 17*   < > 21*  --   --  26* 27*  GFRAA 19*   < > 24*  --   --  30* 31*  ANIONGAP 18*   < > 16*  --   --  13 10   < > = values in this interval not displayed.     Hematology Recent Labs  Lab 03/30/19 1451 03/30/19 1451 03/30/19 1456 03/30/19 1824 03/31/19 0222  WBC 28.6*  --   --   --  33.8*  RBC 5.00  --   --   --  4.78  HGB 15.2   < > 16.7  16.3 15.6 14.8  HCT 45.7   < > 49.0  48.0 46.0 42.5  MCV 91.4  --   --   --  88.9  MCH 30.4  --   --   --  31.0  MCHC 33.3  --   --   --  34.8  RDW 12.3  --   --   --  12.5  PLT 454*  --   --   --  364   < > = values in this interval not displayed.    BNP Recent Labs  Lab 03/30/19 1451 03/30/19 1813  BNP 88.7 549.1*     DDimer No results for input(s): DDIMER in the last 168 hours.   Radiology    US RENAL  Result Date: 03/30/2019 CLINICAL DATA:  Acute kidney injury EXAM: RENAL / URINARY TRACT ULTRASOUND COMPLETE COMPARISON:  CT abdomen 01/27/2017 FINDINGS:  Right Kidney: Renal measurements: 8.5 by 4.8 by 4.2 cm = volume: 90 mL . Echogenicity within normal limits. No mass or hydronephrosis visualized. Scarring in the right kidney most strikingly in the upper pole. Left Kidney: The left kidney cannot be visualized sonographically. Bladder: Not visualized sonographically. Other: Limiting factors included patient's sedation and patient body habitus. IMPRESSION: 1. Considerably atrophic right kidney. 2. Nonvisualization of the left kidney and urinary bladder. Electronically Signed   By: Van Clines M.D.   On: 03/30/2019 21:21   DG CHEST PORT 1 VIEW  Result Date: 03/30/2019 CLINICAL DATA:  Central line placement. Acute kidney injury. Hyperkalemia. Pulmonary edema. EXAM: PORTABLE CHEST 1 VIEW COMPARISON:  Chest x-rays dated 03/30/2019 and 03/23/2018 FINDINGS: Double lumen central venous catheter has been inserted. The tip is overlying the superior vena cava just below the carina in good position. Persistent pulmonary vascular congestion and mild perihilar haziness consistent with mild edema. Mild bibasilar atelectasis. No pneumothorax. No acute bone abnormality. Thoracic spinal cord stimulator in place. IMPRESSION: Central line in good position. No pneumothorax. Persistent pulmonary vascular congestion and mild perihilar edema. Electronically Signed   By: Lorriane Shire M.D.   On: 03/30/2019 18:38   DG Chest Portable 1 View  Result Date: 03/30/2019 CLINICAL DATA:  Chest pain and shortness of breath EXAM: PORTABLE CHEST 1 VIEW COMPARISON:  March 23, 2018 FINDINGS: Heart is enlarged with pulmonary venous hypertension. There is interstitial edema. No appreciable consolidation. No pleural effusions evident. No adenopathy. Stimulator leads are in the lower thoracic region, stable. No pneumothorax. No bone lesions. IMPRESSION: Cardiomegaly with pulmonary vascular congestion. Interstitial edema. Appearance indicative of congestive heart failure. No consolidation.  Electronically Signed   By: Lowella Grip III M.D.   On: 03/30/2019 15:08    Cardiac Studies   Echo: pending  Patient Profile     54 y.o. male with a hx of CAD s/p inf STEMI w/ Integrity BMS RCA 04/2012, DOE with negative CT-PE protocol, normal EF on echo, probable OHS, HTN, obesity, CVA, OSA on CPAP, Hx PE 05/2012 (completed coumadin), gout, MGUS, COPD, who was seen for the evaluation of possible STEMI/chest pain at the request of Dr Laverta Baltimore.  Assessment & Plan    1. NSTEMI with abnormal EKG/Chest pain: presented initially with EKG concerning for STEMI, but found to have K+ >7.5 and in ARF. STEMI cancelled as changes were felt to be metabolic realted. Planned for CRRT. hsTn 5272. Morning EKG shows continued ST changes in the inferior leads, and he says he has had chest pain throughout the night. Currently 8/10. Cr down to 2.6 this morning. Actually did not get CRRT as the line was not working. Has diuresed with IV lasix. Has been on sq heparin. Will plan to start IV heparin this morning. -- start ASA. Will need urgent cardiac cath this morning.  -- The patient understands that risks included but are not limited to stroke (1 in 1000), death (1 in 1000), kidney failure [usually temporary] (1 in 500), bleeding (1 in 200), allergic reaction [possibly serious] (1 in 200).  -- echo pending -- check lipids  2. Acute renal failure/Hyperkalemia: Cr 3.5 on admission, K+ >7.5. Planned for CRRT but line was not working overnight. Given IV lasix with diuresis. Cr down to 2.6 this morning, K+ 5.5. HD cath remains in place.   3. DM: Hgb A1c 12.0. On insulin drip with CBGs improved. Appears was suppose to be on glipizide prior to admission.   4. Leukocytosis: WBC 33.8, but no clear source noted. Remains afebrile. On Vanc per primary.   5. HTN: stable, home medications held on admission. Would favor adding BB post cath.   For questions or updates, please contact [place] Please consult  [email or web address] for contact info under   Signed, Reino Bellis, NP  03/31/2019, 7:58 AM    I have personally seen and examined this patient. I agree with the assessment and plan as outlined above.  54 yo male with history of CKD, CAD with prior bare metal stenting of the RCA, HTN, sleep apnea, morbid obesity who was admitted yesterday with chest pain and found to be in DKA with potassium of 8 and worsened renal failure. His EKG yesterday showed a wide complex rhythm with some inferior ST elevation. Due to his renal failure, cardiac cath was delayed. He has continued to have chest pain through the night. His chest pain is 10/10 this am. EKG this am with 1-2 mm inferior ST elevation. Troponin over 5000. Creatinine 2.6. K 5.5. He has been on CRRT overnight. I have had a long discussion with him at the bedside along with PCCM, Dr. Lamonte Sakai. We both agree that cardiac cath is indicated. The patient is fully informed of the risk of worsened renal failure post dye load. He is willing to proceed with the cath. He will be taken urgently  to the cath lab now.   Lauree Chandler.  03/31/2019 8:24 AM

## 2019-03-31 NOTE — Brief Op Note (Signed)
03/31/2019  9:37 AM  PATIENT:  Jesus Cruz  54 y.o. 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  Does not appear to be true shock (likely related to sedation)  Hyperdynamic cardiac motion (very difficult to engage coronary arteries)  Large patulous left main with 4 major branches  PROCEDURE:  Procedure(s): LEFT HEART CATH AND CORONARY ANGIOGRAPHY (N/A)  Access: Right Radial 6 French sheath-modified Seldinger technique, ultrasound guided  LHC-Coronary Cineangiography (No LV Gram):   5 French TIG 4.0 catheter advanced over long-exchange safety J wire.  Aortic valve crossed for hemodynamics then pullback to engage the LCA.   TIG 4.0 catheter exchanged for 6 [place] guides: Initially JR4 then AR 1 (both unsuccessful)-finally AL-1 successfully engaged RCA. ->  Patent stent with culprit lesion being distal PDA.  Plans for medical management.  SURGEON:  Surgeon(s) and Role:    * Jesus Man, MD - Primary  ANESTHESIA:   local and IV sedation; 3 mL subcu lidocaine.  2 mg Versed, 25 mg fentanyl  EBL:  <50 mL  MEDICATIONS USED: 15,000 units IV heparin; 50 mL contrast  SPECIMEN: Blood sample sent for ACT and CBG  COUNTS:  YES  TOURNIQUET: TR band placed 0920 hrs., 12 mL air  DICTATION: .Note written in EPIC  PLAN OF CARE: Transfer back to MICU (2 M) -> we will restart IV heparin 8 hours after sheath removal.  As needed pain meds  Would run IV heparin for total 72 hours including last night.  We will defer to consulting team, but could consider clopidogrel (we will load today)  We will defer management of renal  failure to primary team/nephrology  PATIENT DISPOSITION:  PACU - hemodynamically stable.   Delay start of Pharmacological VTE agent (>24hrs) due to surgical blood loss or risk of bleeding: not applicable .   Glenetta Hew, M.D., M.S. Interventional Cardiologist    [address] [place], [place] [address]

## 2019-04-01 DIAGNOSIS — L8990 Pressure ulcer of unspecified site, unspecified stage: Secondary | ICD-10-CM | POA: Insufficient documentation

## 2019-04-01 LAB — CBC
HCT: 41.5 % (ref 39.0–52.0)
Hemoglobin: 13.8 g/dL (ref 13.0–17.0)
MCH: 30.8 pg (ref 26.0–34.0)
MCHC: 33.3 g/dL (ref 30.0–36.0)
MCV: 92.6 fL (ref 80.0–100.0)
Platelets: 275 10*3/uL (ref 150–400)
RBC: 4.48 MIL/uL (ref 4.22–5.81)
RDW: 12.8 % (ref 11.5–15.5)
WBC: 24.7 10*3/uL — ABNORMAL HIGH (ref 4.0–10.5)
nRBC: 0 % (ref 0.0–0.2)

## 2019-04-01 LAB — LIPID PANEL
Cholesterol: 118 mg/dL (ref 0–200)
HDL: 27 mg/dL — ABNORMAL LOW (ref >40)
LDL Cholesterol: 52 mg/dL (ref 0–99)
Total CHOL/HDL Ratio: 4.4 RATIO
Triglycerides: 193 mg/dL — ABNORMAL HIGH (ref <150)
VLDL: 39 mg/dL (ref 0–40)

## 2019-04-01 LAB — MAGNESIUM: Magnesium: 2.3 mg/dL (ref 1.7–2.4)

## 2019-04-01 LAB — GLUCOSE, CAPILLARY
Glucose-Capillary: 121 mg/dL — ABNORMAL HIGH (ref 70–99)
Glucose-Capillary: 134 mg/dL — ABNORMAL HIGH (ref 70–99)
Glucose-Capillary: 147 mg/dL — ABNORMAL HIGH (ref 70–99)
Glucose-Capillary: 150 mg/dL — ABNORMAL HIGH (ref 70–99)
Glucose-Capillary: 158 mg/dL — ABNORMAL HIGH (ref 70–99)
Glucose-Capillary: 162 mg/dL — ABNORMAL HIGH (ref 70–99)
Glucose-Capillary: 168 mg/dL — ABNORMAL HIGH (ref 70–99)
Glucose-Capillary: 192 mg/dL — ABNORMAL HIGH (ref 70–99)
Glucose-Capillary: 200 mg/dL — ABNORMAL HIGH (ref 70–99)
Glucose-Capillary: 244 mg/dL — ABNORMAL HIGH (ref 70–99)
Glucose-Capillary: 275 mg/dL — ABNORMAL HIGH (ref 70–99)

## 2019-04-01 LAB — RENAL FUNCTION PANEL
Albumin: 2.3 g/dL — ABNORMAL LOW (ref 3.5–5.0)
Anion gap: 10 (ref 5–15)
BUN: 66 mg/dL — ABNORMAL HIGH (ref 6–20)
CO2: 22 mmol/L (ref 22–32)
Calcium: 8.5 mg/dL — ABNORMAL LOW (ref 8.9–10.3)
Chloride: 103 mmol/L (ref 98–111)
Creatinine, Ser: 1.64 mg/dL — ABNORMAL HIGH (ref 0.61–1.24)
GFR calc Af Amer: 54 mL/min — ABNORMAL LOW (ref >60)
GFR calc non Af Amer: 47 mL/min — ABNORMAL LOW (ref >60)
Glucose, Bld: 151 mg/dL — ABNORMAL HIGH (ref 70–99)
Phosphorus: 3.3 mg/dL (ref 2.5–4.6)
Potassium: 5.1 mmol/L (ref 3.5–5.1)
Sodium: 135 mmol/L (ref 135–145)

## 2019-04-01 LAB — HEPARIN LEVEL (UNFRACTIONATED)
Heparin Unfractionated: <0.1 IU/mL — ABNORMAL LOW (ref 0.30–0.70)
Heparin Unfractionated: 0.29 IU/mL — ABNORMAL LOW (ref 0.30–0.70)

## 2019-04-01 LAB — PROCALCITONIN: Procalcitonin: 0.41 ng/mL

## 2019-04-01 MED ORDER — INSULIN ASPART 100 UNIT/ML ~~LOC~~ SOLN
3.0000 [IU] | SUBCUTANEOUS | Status: DC
  Administered 2019-04-01 (×3): 6 [IU] via SUBCUTANEOUS
  Administered 2019-04-01: 9 [IU] via SUBCUTANEOUS

## 2019-04-01 MED ORDER — SODIUM CHLORIDE 0.9 % IV SOLN
2.0000 g | Freq: Three times a day (TID) | INTRAVENOUS | Status: DC
  Administered 2019-04-01: 2 g via INTRAVENOUS
  Filled 2019-04-01: qty 2

## 2019-04-01 MED ORDER — INSULIN ASPART 100 UNIT/ML ~~LOC~~ SOLN: 1.0000 [IU] | SUBCUTANEOUS | Status: DC

## 2019-04-01 MED ORDER — INSULIN GLARGINE 100 UNIT/ML ~~LOC~~ SOLN
10.0000 [IU] | Freq: Once | SUBCUTANEOUS | Status: AC
  Administered 2019-04-01: 10 [IU] via SUBCUTANEOUS
  Filled 2019-04-01: qty 0.1

## 2019-04-01 MED ORDER — INSULIN ASPART 100 UNIT/ML ~~LOC~~ SOLN
0.0000 [IU] | SUBCUTANEOUS | Status: DC
  Administered 2019-04-01: 11 [IU] via SUBCUTANEOUS
  Administered 2019-04-01 – 2019-04-02 (×2): 3 [IU] via SUBCUTANEOUS
  Administered 2019-04-02: 11 [IU] via SUBCUTANEOUS
  Administered 2019-04-02: 4 [IU] via SUBCUTANEOUS
  Administered 2019-04-03: 3 [IU] via SUBCUTANEOUS
  Administered 2019-04-03: 7 [IU] via SUBCUTANEOUS
  Administered 2019-04-03: 3 [IU] via SUBCUTANEOUS
  Administered 2019-04-03: 7 [IU] via SUBCUTANEOUS
  Administered 2019-04-03 – 2019-04-04 (×2): 11 [IU] via SUBCUTANEOUS
  Administered 2019-04-04: 7 [IU] via SUBCUTANEOUS
  Administered 2019-04-04: 4 [IU] via SUBCUTANEOUS
  Administered 2019-04-04 (×2): 3 [IU] via SUBCUTANEOUS
  Administered 2019-04-05: 4 [IU] via SUBCUTANEOUS
  Administered 2019-04-05: 11 [IU] via SUBCUTANEOUS
  Administered 2019-04-05: 15 [IU] via SUBCUTANEOUS
  Administered 2019-04-05: 4 [IU] via SUBCUTANEOUS
  Administered 2019-04-05: 15 [IU] via SUBCUTANEOUS
  Administered 2019-04-06: 3 [IU] via SUBCUTANEOUS
  Administered 2019-04-06: 4 [IU] via SUBCUTANEOUS
  Administered 2019-04-06: 3 [IU] via SUBCUTANEOUS

## 2019-04-01 MED ORDER — INSULIN DETEMIR 100 UNIT/ML ~~LOC~~ SOLN
10.0000 [IU] | Freq: Two times a day (BID) | SUBCUTANEOUS | Status: DC
  Administered 2019-04-01 – 2019-04-05 (×9): 10 [IU] via SUBCUTANEOUS
  Filled 2019-04-01 (×12): qty 0.1

## 2019-04-01 MED ORDER — INSULIN DETEMIR 100 UNIT/ML ~~LOC~~ SOLN: 24.0000 [IU] | Freq: Two times a day (BID) | SUBCUTANEOUS | Status: DC

## 2019-04-01 MED ORDER — PHENOL 1.4 % MT LIQD
1.0000 | OROMUCOSAL | Status: DC | PRN
  Administered 2019-04-01 – 2019-04-03 (×3): 1 via OROMUCOSAL
  Filled 2019-04-01 (×2): qty 177

## 2019-04-01 MED ORDER — METOPROLOL SUCCINATE ER 50 MG PO TB24
150.0000 mg | ORAL_TABLET | Freq: Every day | ORAL | Status: DC
  Administered 2019-04-01 – 2019-04-07 (×7): 150 mg via ORAL
  Filled 2019-04-01: qty 1
  Filled 2019-04-01: qty 3
  Filled 2019-04-01 (×5): qty 1

## 2019-04-01 MED FILL — Nitroglycerin IV Soln 100 MCG/ML in D5W: INTRA_ARTERIAL | Qty: 10 | Status: AC

## 2019-04-01 NOTE — Progress Notes (Signed)
Progress Note  Patient Name: Jesus Cruz Date of Encounter: 04/01/2019  Primary Cardiologist: Othello has mild neck pain. Improved from yesterday.   Inpatient Medications    Scheduled Meds: . aspirin EC  81 mg Oral Daily  . Chlorhexidine Gluconate Cloth  6 each Topical Q0600  . clopidogrel  75 mg Oral Daily  . insulin aspart  3-9 Units Subcutaneous Q4H  . mupirocin ointment  1 application Nasal BID  . sodium chloride flush  3 mL Intravenous Q12H  . sodium chloride flush  3 mL Intravenous Q12H  . sodium zirconium cyclosilicate  10 g Oral BID   Continuous Infusions: . sodium chloride    . sodium chloride 10 mL/hr at 04/01/19 0600  . sodium chloride    . ceFEPime (MAXIPIME) IV Stopped (03/31/19 2228)  . heparin 1,650 Units/hr (04/01/19 0600)   PRN Meds: sodium chloride, sodium chloride, acetaminophen, morphine injection, ondansetron (ZOFRAN) IV, sodium chloride flush, sodium chloride flush   Vital Signs    Vitals:   04/01/19 0351 04/01/19 0400 04/01/19 0500 04/01/19 0600  BP:  138/83 (!) 160/101   Pulse:  88 90 79  Resp:  18 17 (!) 23  Temp: (!) 97.5 F (36.4 C)     TempSrc: Axillary     SpO2:  97% 96% 98%  Weight:   (!) 142.9 kg   Height:        Intake/Output Summary (Last 24 hours) at 04/01/2019 0710 Last data filed at 04/01/2019 0600 Gross per 24 hour  Intake 1724.75 ml  Output 1960 ml  Net -235.25 ml   Last 3 Weights 04/01/2019 03/31/2019 03/30/2019  Weight (lbs) 315 lb 0.6 oz 314 lb 6 oz 308 lb 10.3 oz  Weight (kg) 142.9 kg 142.6 kg 140 kg      Telemetry    sinus - Personally Reviewed  ECG    No AM EKG - Personally Reviewed  Physical Exam   GEN: No acute distress.   Neck: No JVD Cardiac: RRR, no murmurs, rubs, or gallops.  Respiratory: Clear to auscultation bilaterally. GI: Soft, nontender, non-distended  MS: No edema; No deformity. Neuro:  Nonfocal  Psych: Normal affect   Labs    High Sensitivity Troponin:     Recent Labs  Lab 03/30/19 1451 03/30/19 1813 03/30/19 1942  TROPONINIHS 2,304* 4,413* 5,272*      Chemistry Recent Labs  Lab 03/30/19 1451 03/30/19 1456 03/31/19 0411 03/31/19 1023 03/31/19 1604 03/31/19 2306 04/01/19 0305  NA 120*   < > 133*   < > 136 136 135  K >7.5*   < > 5.5*   < > 5.8* 5.2* 5.1  CL 83*   < > 98   < > 102 105 103  CO2 19*   < > 25   < > 21* 23 22  GLUCOSE 857*   < > 235*   < > 154* 195* 151*  BUN 101*   < > 89*   < > 81* 69* 66*  CREATININE 3.82*   < > 2.60*   < > 2.06* 1.78* 1.64*  CALCIUM 8.6*   < > 8.9   < > 8.7* 8.6* 8.5*  PROT 7.4  --   --   --   --   --   --   ALBUMIN 3.0*   < > 2.5*  --  2.6*  --  2.3*  AST 40  --   --   --   --   --   --  ALT 37  --   --   --   --   --   --   ALKPHOS 224*  --   --   --   --   --   --   BILITOT 0.9  --   --   --   --   --   --   GFRNONAA 17*   < > 27*   < > 35* 42* 47*  GFRAA 19*   < > 31*   < > 41* 49* 54*  ANIONGAP 18*   < > 10   < > 13 8 10    < > = values in this interval not displayed.     Hematology Recent Labs  Lab 03/30/19 1451 03/30/19 1456 03/30/19 1824 03/31/19 0222 04/01/19 0305  WBC 28.6*  --   --  33.8* 24.7*  RBC 5.00  --   --  4.78 4.48  HGB 15.2   < > 15.6 14.8 13.8  HCT 45.7   < > 46.0 42.5 41.5  MCV 91.4  --   --  88.9 92.6  MCH 30.4  --   --  31.0 30.8  MCHC 33.3  --   --  34.8 33.3  RDW 12.3  --   --  12.5 12.8  PLT 454*  --   --  364 275   < > = values in this interval not displayed.    BNP Recent Labs  Lab 03/30/19 1451 03/30/19 1813  BNP 88.7 549.1*     DDimer No results for input(s): DDIMER in the last 168 hours.   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  Prox RCA lesion is 30% stenosed.  Previously placed Prox RCA to Mid RCA stent (unknown type) is widely patent.  Mid RCA lesion is 60% stenosed.  1st Diag/Ramus lesion is 40% stenosed.  Ramus/OM lesion is  50% stenosed.  1st Mrg/Distal LCx lesion is 60% stenosed.  LV end diastolic pressure is normal.  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  Does not appear to be true shock (likely related to sedation)  Hyperdynamic cardiac motion (very difficult to engage coronary arteries)  Large patulous left main with 4 major branches PLAN OF CARE:  Transfer back to MICU (2 M) -> we will restart IV heparin 8 hours after sheath removal.  Would run IV heparin for total 72 hours including last night.  As needed pain meds  We will defer to consulting team, but could consider clopidogrel (we will load today)  We will defer management of renal failure to primary team/nephrology. Glenetta Hew, M.D., M.S. Interventional Cardiologist [address] [place], [place] [id number]  US RENAL  Result Date: 03/30/2019 CLINICAL DATA:  Acute kidney injury EXAM: RENAL / URINARY TRACT ULTRASOUND COMPLETE COMPARISON:  CT abdomen 01/27/2017 FINDINGS: Right Kidney: Renal measurements: 8.5 by 4.8 by 4.2 cm = volume: 90 mL . Echogenicity within normal limits. No mass or hydronephrosis visualized. Scarring in the right kidney most strikingly in the upper pole. Left Kidney: The left kidney cannot be visualized sonographically. Bladder: Not visualized sonographically. Other: Limiting  factors included patient's sedation and patient body habitus. IMPRESSION: 1. Considerably atrophic right kidney. 2. Nonvisualization of the left kidney and urinary bladder. Electronically Signed   By: Van Clines M.D.   On: 03/30/2019 21:21   DG CHEST PORT 1 VIEW  Result Date: 03/30/2019 CLINICAL DATA:  Central line placement. Acute kidney  injury. Hyperkalemia. Pulmonary edema. EXAM: PORTABLE CHEST 1 VIEW COMPARISON:  Chest x-rays dated 03/30/2019 and 03/23/2018 FINDINGS: Double lumen central venous catheter has been inserted. The tip is overlying the superior vena cava just below the carina in good position. Persistent pulmonary vascular congestion and mild perihilar haziness consistent with mild edema. Mild bibasilar atelectasis. No pneumothorax. No acute bone abnormality. Thoracic spinal cord stimulator in place. IMPRESSION: Central line in good position. No pneumothorax. Persistent pulmonary vascular congestion and mild perihilar edema. Electronically Signed   By: Lorriane Shire M.D.   On: 03/30/2019 18:38   DG Chest Portable 1 View  Result Date: 03/30/2019 CLINICAL DATA:  Chest pain and shortness of breath EXAM: PORTABLE CHEST 1 VIEW COMPARISON:  March 23, 2018 FINDINGS: Heart is enlarged with pulmonary venous hypertension. There is interstitial edema. No appreciable consolidation. No pleural effusions evident. No adenopathy. Stimulator leads are in the lower thoracic region, stable. No pneumothorax. No bone lesions. IMPRESSION: Cardiomegaly with pulmonary vascular congestion. Interstitial edema. Appearance indicative of congestive heart failure. No consolidation. Electronically Signed   By: Lowella Grip III M.D.   On: 03/30/2019 15:08   ECHOCARDIOGRAM COMPLETE  Result Date: 03/31/2019    ECHOCARDIOGRAM REPORT   Patient Name:   Jesus Cruz Date of Exam: 03/31/2019 Medical Rec #:  [id number]    Height:       67.0 in Accession #:    [id number]   Weight:       314.4 lb Date of Birth:  1965/08/29     BSA:          2.451 m Patient Age:    63 years     BP:           101/74 mmHg Patient Gender: M            HR:           101 bpm. Exam Location:  Inpatient Procedure: 2D Echo, Cardiac Doppler and Color Doppler Indications:    Chest pain 786.50/R07.9  History:        Patient has prior history of Echocardiogram examinations, most                  recent 09/17/2013. CAD; Risk Factors:Hypertension. Acute MI.                 Acute renal failure. H/o CVA.  Sonographer:    Clayton Lefort RDCS (AE) Referring Phys: 1993 [facility] G BARRETT  Sonographer Comments: Suboptimal parasternal window, suboptimal apical window, suboptimal subcostal window and patient is morbidly obese. Image acquisition challenging due to patient body habitus. IMPRESSIONS  1. Small area of inferior basal hypokinesis. Left ventricular ejection fraction, by estimation, is 55 to 60%. The left ventricle has normal function. Left ventricular endocardial border not optimally defined to evaluate regional wall motion. There is mild left ventricular hypertrophy. Left ventricular diastolic parameters were normal.  2. Right ventricular systolic function is normal. The right ventricular size is normal.  3. The mitral valve was not well visualized. No evidence of mitral valve regurgitation. No evidence of mitral stenosis.  4. The aortic valve was not well visualized. Aortic valve regurgitation is not  visualized. No aortic stenosis is present.  5. The inferior vena cava is normal in size with greater than 50% respiratory variability, suggesting right atrial pressure of 3 mmHg. FINDINGS  Left Ventricle: Small area of inferior basal hypokinesis. Left ventricular ejection fraction, by estimation, is 55 to 60%. The left ventricle has normal function. Left ventricular endocardial border not optimally defined to evaluate regional wall motion. The left ventricular internal cavity size was normal in size. There is mild left ventricular hypertrophy. Left ventricular diastolic parameters were normal. Right Ventricle: The right ventricular size is normal. No increase in right ventricular wall thickness. Right ventricular systolic function is normal. Left Atrium: Left atrial size was normal in size. Right Atrium: Right atrial size was normal in size. Pericardium: There is no evidence of pericardial effusion. Mitral Valve:  The mitral valve was not well visualized. There is mild thickening of the mitral valve leaflet(s). There is mild calcification of the mitral valve leaflet(s). Normal mobility of the mitral valve leaflets. No evidence of mitral valve regurgitation. No evidence of mitral valve stenosis. Tricuspid Valve: The tricuspid valve is normal in structure. Tricuspid valve regurgitation is not demonstrated. No evidence of tricuspid stenosis. Aortic Valve: The aortic valve was not well visualized. Aortic valve regurgitation is not visualized. No aortic stenosis is present. Aortic valve mean gradient measures 3.0 mmHg. Aortic valve peak gradient measures 5.3 mmHg. Aortic valve area, by VTI measures 4.55 cm. Pulmonic Valve: The pulmonic valve was normal in structure. Pulmonic valve regurgitation is not visualized. No evidence of pulmonic stenosis. Aorta: The aortic root is normal in size and structure. Venous: The inferior vena cava is normal in size with greater than 50% respiratory variability, suggesting right atrial pressure of 3 mmHg. IAS/Shunts: No atrial level shunt detected by color flow Doppler.  LEFT VENTRICLE PLAX 2D LVIDd:         3.70 cm  Diastology LVIDs:         2.70 cm  LV e' lateral:   4.57 cm/s LV PW:         1.50 cm  LV E/e' lateral: 13.9 LV IVS:        1.20 cm  LV e' medial:    5.22 cm/s LVOT diam:     2.30 cm  LV E/e' medial:  12.2 LV SV:         72 LV SV Index:   29 LVOT Area:     4.15 cm  RIGHT VENTRICLE             IVC RV Basal diam:  2.60 cm     IVC diam: 1.90 cm RV S prime:     17.60 cm/s TAPSE (M-mode): 1.9 cm LEFT ATRIUM             Index       RIGHT ATRIUM          Index LA diam:        1.60 cm 0.65 cm/m  RA Area:     9.70 cm LA Vol (A2C):   41.2 ml 16.81 ml/m RA Volume:   18.10 ml 7.39 ml/m LA Vol (A4C):   49.8 ml 20.32 ml/m LA Biplane Vol: 48.3 ml 19.71 ml/m  AORTIC VALVE AV Area (Vmax):    3.76 cm AV Area (Vmean):   3.68 cm AV Area (VTI):     4.55 cm AV Vmax:           115.00 cm/s AV  Vmean:  88.900 cm/s AV VTI:            0.159 m AV Peak Grad:      5.3 mmHg AV Mean Grad:      3.0 mmHg LVOT Vmax:         104.00 cm/s LVOT Vmean:        78.700 cm/s LVOT VTI:          0.174 m LVOT/AV VTI ratio: 1.09  AORTA Ao Root diam: 3.20 cm MITRAL VALVE MV Area (PHT): 2.26 cm    SHUNTS MV Decel Time: 335 msec    Systemic VTI:  0.17 m MV E velocity: 63.50 cm/s  Systemic Diam: 2.30 cm MV A velocity: 79.50 cm/s MV E/A ratio:  0.80 Jenkins Rouge MD Electronically signed by Jenkins Rouge MD Signature Date/Time: 03/31/2019/2:11:49 PM    Final    VAS [place] LOWER EXTREMITY VENOUS (DVT)  Result Date: 03/31/2019  Lower Venous DVTStudy Indications: Edema, and SOB.  Limitations: Body habitus and poor ultrasound/tissue interface. Comparison Study: No prior exam. Performing Technologist: Baldwin Crown ARDMS, RVT  Examination Guidelines: A complete evaluation includes B-mode imaging, spectral Doppler, color Doppler, and power Doppler as needed of all accessible portions of each vessel. Bilateral testing is considered an integral part of a complete examination. Limited examinations for reoccurring indications may be performed as noted. The reflux portion of the exam is performed with the patient in reverse Trendelenburg.  +---------+---------------+---------+-----------+----------+------------------+ RIGHT    CompressibilityPhasicitySpontaneityPropertiesThrombus Aging     +---------+---------------+---------+-----------+----------+------------------+ CFV      Full           Yes      Yes                                     +---------+---------------+---------+-----------+----------+------------------+ SFJ      Full                                                            +---------+---------------+---------+-----------+----------+------------------+ FV Prox  Full                                                             +---------+---------------+---------+-----------+----------+------------------+ FV Mid   Full                                                            +---------+---------------+---------+-----------+----------+------------------+ FV DistalFull                                                            +---------+---------------+---------+-----------+----------+------------------+ PFV      Full                                                            +---------+---------------+---------+-----------+----------+------------------+  POP      Full           Yes      Yes                                     +---------+---------------+---------+-----------+----------+------------------+ PTV      Full                                                            +---------+---------------+---------+-----------+----------+------------------+ PERO                                                  visualized with                                                          color              +---------+---------------+---------+-----------+----------+------------------+   +---------+---------------+---------+-----------+----------+------------------+ LEFT     CompressibilityPhasicitySpontaneityPropertiesThrombus Aging     +---------+---------------+---------+-----------+----------+------------------+ CFV      Full           Yes      Yes                                     +---------+---------------+---------+-----------+----------+------------------+ SFJ      Full                                                            +---------+---------------+---------+-----------+----------+------------------+ FV Prox  Full                                                            +---------+---------------+---------+-----------+----------+------------------+ FV Mid   Full                                                             +---------+---------------+---------+-----------+----------+------------------+ FV DistalFull                                                            +---------+---------------+---------+-----------+----------+------------------+ PFV      Full                                                            +---------+---------------+---------+-----------+----------+------------------+  POP      Full           Yes      Yes                                     +---------+---------------+---------+-----------+----------+------------------+ PTV      Full                                                            +---------+---------------+---------+-----------+----------+------------------+ PERO                                                  visualized with                                                          color              +---------+---------------+---------+-----------+----------+------------------+     Summary: RIGHT: - There is no evidence of deep vein thrombosis in the lower extremity. However, portions of this examination were limited- see technologist comments above.  - No cystic structure found in the popliteal fossa. - Poorly visualized calf veins.  LEFT: - There is no evidence of deep vein thrombosis in the lower extremity. However, portions of this examination were limited- see technologist comments above.  - No cystic structure found in the popliteal fossa. - Poorly visualized calf veins.  *See table(s) above for measurements and observations. Electronically signed by Ruta Hinds MD on 03/31/2019 at 4:01:49 PM.    Final     Cardiac Studies   Echo 03/31/19:  1. Small area of inferior basal hypokinesis. Left ventricular ejection  fraction, by estimation, is 55 to 60%. The left ventricle has normal  function. Left ventricular endocardial border not optimally defined to  evaluate regional wall motion. There is  mild left ventricular hypertrophy. Left  ventricular diastolic parameters  were normal.  2. Right ventricular systolic function is normal. The right ventricular  size is normal.  3. The mitral valve was not well visualized. No evidence of mitral valve  regurgitation. No evidence of mitral stenosis.  4. The aortic valve was not well visualized. Aortic valve regurgitation  is not visualized. No aortic stenosis is present.  5. The inferior vena cava is normal in size with greater than 50%  respiratory variability, suggesting right atrial pressure of 3 mmHg.   Patient Profile     55 y.o. male with a hx of CAD s/p inf STEMI w/ Integrity BMS RCA 04/2012, DOEwith negative CT-PE protocol, normal EF on echo,probable OHS, HTN, obesity, CVA, OSA on CPAP, Hx PE 05/2012 (completed coumadin), gout, MGUS,COPD,who was seen for the evaluation of possible STEMI/chest painat the request of Dr Laverta Baltimore.  Assessment & Plan    1. CAD/Inferior STEMI: Pt admitted with chest pain and altered mental status. Found to be in DKA. Initial code STEMI cancelled on 03/30/19 but on the am  of 03/31/19, he had continued chest pain, elevated troponin and inferior ST elevation. Cardiac cath urgently on the morning of 03/31/19 showed occlusion of the PDA. His other major vessels were patent. Echo with LVEF=55-60%. Continue ASA and Plavix. Consider adding a beta blocker. He was on Toprol 100 mg daily at home. Resume statin. Continue IV heparin for 48 hours post cath. (would stop on Friday around noon).     For questions or updates, please contact [place] Please consult [email or web address] for contact info under        Signed, Lauree Chandler, MD  04/01/2019, 7:10 AM

## 2019-04-01 NOTE — Progress Notes (Addendum)
NAME:  Jesus Cruz, MRN:  [id number], DOB:  [birth year], LOS: 2 ADMISSION DATE:  03/30/2019, CONSULTATION DATE:  03/30/2019 REFERRING MD:  Dr. Laverta Baltimore, CHIEF COMPLAINT:  HHS with hyperkalemia    Brief History   54 year old male admitted with complaints of chest pain as a code STEMI found to have hyperkalemia and acute renal failure PCCM consulted for admission  History of present illness   Jesus Cruz is a 54 year old male with a past medical history significant for CAD, hypertension, and prior CVA who presented via EMS with complaints of chest pain as a code STEMI.  On arrival patient was seen diaphoretic and with complaints of shortness of breath. Cardiology evaluated on admission with recommendations of treatment for hyperkalemia and renal failure prior to taking patient to the Cath Lab.  Complete lab work currently pending however initial laboratory abnormalities include sodium of 118, potassium of 8, glucose greater than 700, creatinine 3.50 and WBC 28.6. EKG with tall peaked T-wave.   Nephrology consulted on admission and reccommended CRRT for acute renal failure and hyperkalemia.   PCCM consulted for admission  Past Medical History  STEMI in 2014 PE OSA no CPAP  Obesity HTN GOUT CVA DM   Significant Hospital Events   3/23 >> Amitted 3/24 >>CVVHD was deferred last night given significant urine output after Lasix, improvement in his potassium He had chest pain through the night.  EKG review with Dr. Angelena Form this morning concerning for ST elevations.  Taken for [facility]- no intervention  Consults:  Cardiology  Nephrology   Procedures:  Left IJ HD cath 3/23  2/24  LHC   Significant Diagnostic Tests:  Echo 03/31/19: 1. Small area of inferior basal hypokinesis. Left ventricular ejection  fraction, by estimation, is 55 to 60%. The left ventricle has normal  function. Left ventricular endocardial border not optimally defined to  evaluate regional wall motion. There is  mild left  ventricular hypertrophy. Left ventricular diastolic parameters  were normal.  2. Right ventricular systolic function is normal. The right ventricular  size is normal.  3. The mitral valve was not well visualized. No evidence of mitral valve  regurgitation. No evidence of mitral stenosis.  4. The aortic valve was not well visualized. Aortic valve regurgitation  is not visualized. No aortic stenosis is present.  5. The inferior vena cava is normal in size with greater than 50%  respiratory variability, suggesting right atrial pressure of 3 mmHg.   3/24 B/L DVT US >> RIGHT:  - There is no evidence of deep vein thrombosis in the lower extremity.  However, portions of this examination were limited- see technologist  comments above.  - No cystic structure found in the popliteal fossa.  - Poorly visualized calf veins.    LEFT:  - There is no evidence of deep vein thrombosis in the lower extremity.  However, portions of this examination were limited- see technologist  comments above.  - No cystic structure found in the popliteal fossa.  - Poorly visualized calf veins.   2/24 LHC >>  LIKELY CULPRIT LESION: Mid PDA (small caliber) 100% -> not good PCI target.  Prox RCA lesion is 30% stenosed.  Previously placed Prox RCA to Mid RCA stent (unknown type) is widely patent.  Mid RCA lesion is 60% stenosed.  1st Diag/Ramus lesion is 40% stenosed.  Ramus/OM lesion is 50% stenosed.  1st Mrg/Distal LCx lesion is 60% stenosed.  LV end diastolic pressure is normal.    Micro Data:  COVID 3/23 >  Negative  MRSA PCR 3/23 >> positive 3/23 BCx2  >> ngtd x 2 days  Antimicrobials:  Vancomycin 3/23 >> 3/24 Cefepime 3/23 >> 3/25  Interim history/subjective:  Transitioned off insulin gtt overnight Improving renal function  Afebrile  Patient complains of right lower leg pain, right neck pain, dyspnea, and chest pain worse with inspiration.  Overall feels unwell.  RN reports increased  anxiety and tearful at times.   Objective   Blood pressure (!) 121/107, pulse 92, temperature (!) 97.5 F (36.4 C), temperature source Axillary, resp. rate 17, height 5\' 7"  (1.702 m), weight (!) 142.9 kg, SpO2 94 %.        Intake/Output Summary (Last 24 hours) at 04/01/2019 1110 Last data filed at 04/01/2019 1100 Gross per 24 hour  Intake 1580.56 ml  Output 2310 ml  Net -729.44 ml   Filed Weights   03/30/19 1513 03/31/19 0114 04/01/19 0500  Weight: (!) 140 kg (!) 142.6 kg (!) 142.9 kg    Examination: General:  Chronically ill morbidly obese appearing male lying in bed, NAD HEENT: MM pink/dry, some white patches on posterior oropharynx wipes off, tongue ulcer  Neuro: alert, oriented, flat affect, borderline tearful, MAE, generalized weakness CV: rr, no murmur, L IJ temp HD cath wnl PULM:  Non labored, on 4L [place], clear anteriorly, diminished in bases  GI: soft, mild tenderness RUQ, +bs, foley   Extremities: warm/dry, +1 non pitting LE edema, RLE with erythema and warmth- not draining and remains in marked boundaries, upper thigh folds/ pannus with excoriated/ erythema    Resolved Hospital Problem list     Assessment & Plan:   Acute renal failure on CKD Hyperkalemia  -Most recent Creatine on chart review was 1.03 06/2017, Creatinine 3.50 on admission. P: Appreciate nephrology assistance Net neg 1339, wt stable recs to continue temp HD cath for additional day due to risk of contrast induced renal nephropathy  Strict I/O/ trend renal panel  Avoid further nephrotoxins/ hold any diuresis today   Acute Hypoxic Respiratory insufficiency  -Secondary to volume overload  P: Wean oxygen as able Diuresis as able- skip today given contrast load yesterday  Push pulmonary hygiene- IS  PT ordered  Follow chest x-ray intermittently  CAD/ inferior STEMI HLD - Prior STEMI 2014 - developed inferior STE 3/24 with continued chest pain taken for LHC which showed occlusion of the PDA,  other large vessels patent, normal LVEF with recommendations for medical management P: Tele monitoring Appreciate cardilogy input  Continue ASA and plavix Continuing heparin gtt for 48 hour post LHC (3/24) Resume home beta blocker at half of home dose  Continue statin   DM with HHS -resolved - A1c 12 P: SSI resistant Add levemir 10 units BID   Hypertension -Home medications include Norvasc, Cardura, Lisinopril, toprol xl  P:  Holding home norvasc, lisinopril restart per cardiology recommendations  Leucocytosis -WBC 28.6 on admission, afebrile. Likely reactive vs possible  LLE cellulitis  P: PCT 0.46-> 0.53- > 0.41 ? Reactive  Stop cefepime and monitor clinically Trend WBC/ follow cultures   History of PE -Patient reports dyspnea on admission with decrease in activity recently. Dose no appear to be on long term anticoagulation at baseline P: On heparin gtt as above  LE DVT ultrasound negative, less likely for PE Can consider VQ scan if ongoing, sound pleuritic in nature   LE wound/ wounds in folds/ panus  P:  Wound care consult   Deconditioning P:  PT/OT consults  Best practice:  Diet:  Carb modified diet/ heart healthy Pain/Anxiety/Delirium protocol (if indicated): PRN's VAP protocol (if indicated): N/A DVT prophylaxis: Heparin infusion GI prophylaxis: PPI Glucose control: SSI  Mobility:  Bedrest  Code Status: Full Family Communication: patient updated 3/25 Disposition: will transfer to PCU and to [facility] as of 3/26  Labs   CBC: Recent Labs  Lab 03/30/19 1451 03/30/19 1456 03/30/19 1824 03/31/19 0222 04/01/19 0305  WBC 28.6*  --   --  33.8* 24.7*  NEUTROABS 25.5*  --   --   --   --   HGB 15.2 16.7  16.3 15.6 14.8 13.8  HCT 45.7 49.0  48.0 46.0 42.5 41.5  MCV 91.4  --   --  88.9 92.6  PLT 454*  --   --  364 [id number]    Basic Metabolic Panel: Recent Labs  Lab 03/30/19 1813 03/30/19 1824 03/30/19 2209 03/31/19 0222 03/31/19 0411 03/31/19 1023  03/31/19 1604 03/31/19 2306 04/01/19 0305  NA 130*   < >   < >  --  133* 135 136 136 135  K 6.3*   < >   < >  --  5.5* 5.6* 5.8* 5.2* 5.1  CL 91*   < >   < >  --  98 101 102 105 103  CO2 23   < >   < >  --  25 22 21* 23 22  GLUCOSE 531*   < >   < >  --  235* 203* 154* 195* 151*  BUN 96*   < >   < >  --  89* 88* 81* 69* 66*  CREATININE 3.18*   < >   < >  --  2.60* 2.33* 2.06* 1.78* 1.64*  CALCIUM 9.7   < >   < >  --  8.9 8.4* 8.7* 8.6* 8.5*  MG 2.6*  --   --  2.4  --   --   --   --  2.3  PHOS 4.9*  --   --  4.3 4.4  --  3.7  --  3.3   < > = values in this interval not displayed.   GFR: Estimated Creatinine Clearance: 70.5 mL/min (A) (by C-G formula based on SCr of 1.64 mg/dL (H)). Recent Labs  Lab 03/30/19 1451 03/30/19 1456 03/30/19 1813 03/31/19 0222 04/01/19 0305  PROCALCITON  --   --  0.46 0.53 0.41  WBC 28.6*  --   --  33.8* 24.7*  LATICACIDVEN  --  3.4* 2.8*  --   --     Liver Function Tests: Recent Labs  Lab 03/30/19 1451 03/31/19 0411 03/31/19 1604 04/01/19 0305  AST 40  --   --   --   ALT 37  --   --   --   ALKPHOS 224*  --   --   --   BILITOT 0.9  --   --   --   PROT 7.4  --   --   --   ALBUMIN 3.0* 2.5* 2.6* 2.3*   Recent Labs  Lab 03/30/19 1451  LIPASE 40   Recent Labs  Lab 03/30/19 1512  AMMONIA 31    ABG    Component Value Date/Time   PHART 7.374 03/30/2019 1824   PCO2ART 44.1 03/30/2019 1824   PO2ART 83.0 03/30/2019 1824   HCO3 25.9 03/30/2019 1824   TCO2 27 03/30/2019 1824   ACIDBASEDEF 3.0 (H) 03/30/2019 1456   O2SAT 96.0 03/30/2019 1824     Coagulation  Profile: No results for input(s): INR, PROTIME in the last 168 hours.  Cardiac Enzymes: No results for input(s): CKTOTAL, CKMB, CKMBINDEX, TROPONINI in the last 168 hours.  HbA1C: Hgb A1c MFr Bld  Date/Time Value Ref Range Status  03/30/2019 06:13 PM 12.0 (H) 4.8 - 5.6 % Final    Comment:    (NOTE)         Prediabetes: 5.7 - 6.4         Diabetes: >6.4         Glycemic  control for adults with diabetes: <7.0   09/17/2013 04:30 AM 6.1 (H) <5.7 % Final    Comment:    (NOTE)                                                                       According to the ADA Clinical Practice Recommendations for 2011, when HbA1c is used as a screening test:  >=6.5%   Diagnostic of Diabetes Mellitus           (if abnormal result is confirmed) 5.7-6.4%   Increased risk of developing Diabetes Mellitus References:Diagnosis and Classification of Diabetes Mellitus,Diabetes [id number] 1):S62-S69 and Standards of Medical Care in         Diabetes - 2011,Diabetes [id number] (Suppl 1):S11-S61.    CBG: Recent Labs  Lab 04/01/19 0328 04/01/19 0336 04/01/19 0438 04/01/19 0801 04/01/19 1106  GLUCAP 162* 147* 168* 200* 192*         Kennieth Rad, MSN, AGACNP-BC [facility] 04/01/2019, 11:10 AM

## 2019-04-01 NOTE — Progress Notes (Signed)
error 

## 2019-04-01 NOTE — Consult Note (Signed)
WOC Nurse Consult Note: Patient receiving care in [facility] 2M12. Reason for Consult: right toe ulcer Wound type: abrasions to right great toe tip and right 4th toe tip, and right plantar 4th metatarsal head area. Pressure Injury POA: Yes/No/NA Measurement: all were very small Wound bed: black, dry on the toes; pinhole on plantar foot 4th metatarsal head area with an adjacent fissure Drainage (amount, consistency, odor) none Periwound: intact Dressing procedure/placement/frequency: Apply iodine to right great toe tip, right 4th toe tip, and the area on the plantar surface at the 4th toe.  Allow to air dry.  Monitor the wound area(s) for worsening of condition such as: Signs/symptoms of infection,  Increase in size,  Development of or worsening of odor, Development of pain, or increased pain at the affected locations.  Notify the medical team if any of these develop.  Thank you for the consult.  Discussed plan of care with the patient and bedside nurse.  Ethel nurse will not follow at this time.  Please re-consult the [place] team if needed.  Val Riles, RN, MSN, CWOCN, CNS-BC, pager [phone]

## 2019-04-01 NOTE — Progress Notes (Signed)
ANTICOAGULATION CONSULT NOTE - [place] for heparin  Indication: chest pain/ACS  Patient Measurements: Height: 5\' 7"  (170.2 cm) Weight: (!) 314 lb 6 oz (142.6 kg) IBW/kg (Calculated) : 66.1 Heparin Dosing Weight: 100kg  Vital Signs: Temp: 97.5 F (36.4 C) (03/25 0351) Temp Source: Axillary (03/25 0351) BP: 138/83 (03/25 0400) Pulse Rate: 88 (03/25 0400)  Labs: Recent Labs    03/30/19 1451 03/30/19 1456 03/30/19 1813 03/30/19 1824 03/30/19 1942 03/30/19 2209 03/31/19 0222 03/31/19 0308 03/31/19 0411 03/31/19 1604 03/31/19 2306 04/01/19 0305  HGB 15.2   < >  --  15.6  --    < > 14.8  --   --   --   --  13.8  HCT 45.7   < >  --  46.0  --   --  42.5  --   --   --   --  41.5  PLT 454*  --   --   --   --   --  364  --   --   --   --  275  APTT  --   --   --   --   --   --   --  28  --   --   --   --   HEPARINUNFRC  --   --   --   --   --   --   --   --   --   --   --  <0.10*  CREATININE 3.82*   < > 3.18*  --   --    < >  --   --    < > 2.06* 1.78* 1.64*  TROPONINIHS 2,304*  --  4,413*  --  5,272*  --   --   --   --   --   --   --    < > = values in this interval not displayed.    Estimated Creatinine Clearance: 70.4 mL/min (A) (by C-G formula based on SCr of 1.64 mg/dL (H)).    Medications:  Medications Prior to Admission  Medication Sig Dispense Refill Last Dose  . allopurinol (ZYLOPRIM) 300 MG tablet Take 300 mg by mouth daily.   03/28/2019  . amLODipine (NORVASC) 10 MG tablet Take 10 mg by mouth at bedtime.    03/29/2019 at Unknown time  . aspirin EC 81 MG tablet Take 81 mg by mouth at bedtime.   03/29/2019 at Unknown time  . atorvastatin (LIPITOR) 10 MG tablet Take 10 mg by mouth at bedtime.    03/29/2019 at Unknown time  . colchicine 0.6 MG tablet Take 0.6 mg by mouth at bedtime as needed (gout).    Past Week at Unknown time  . cyclobenzaprine (FLEXERIL) 10 MG tablet Take 10 mg by mouth 2 (two) times daily as needed.   03/29/2019 at Unknown  time  . doxazosin (CARDURA) 2 MG tablet Take 2 mg by mouth at bedtime.   03/29/2019 at Unknown time  . furosemide (LASIX) 20 MG tablet Take 20 mg by mouth daily as needed for fluid.    Past Week at Unknown time  . glimepiride (AMARYL) 1 MG tablet Take 1-2 mg by mouth at bedtime.    03/29/2019 at Unknown time  . HYDROcodone-acetaminophen (NORCO/VICODIN) 5-325 MG tablet Take 1 tablet by mouth 2 (two) times daily as needed for pain.   03/30/2019 at Unknown time  . lisinopril (ZESTRIL) 10 MG tablet Take 10 mg by  mouth daily.   03/29/2019 at Unknown time  . metoprolol succinate (TOPROL-XL) 100 MG 24 hr tablet Take 300 mg by mouth daily. Take with or immediately following a meal.   03/29/2019 at 2300  . morphine (MS CONTIN) 15 MG 12 hr tablet Take 15 mg by mouth 3 (three) times daily.   03/30/2019 at Unknown time  . naloxone (NARCAN) nasal spray 4 mg/0.1 mL Place 1 spray into the nose once.    unknown  . nitroGLYCERIN (NITROSTAT) 0.4 MG SL tablet Place 0.4 mg under the tongue every 5 (five) minutes as needed for chest pain.   unknown  . predniSONE (DELTASONE) 10 MG tablet Take 10 mg by mouth daily as needed.   Past Week at Unknown time  . pregabalin (LYRICA) 100 MG capsule Take 100 mg by mouth 3 (three) times daily.    03/30/2019 at Unknown time  . triamcinolone cream (KENALOG) 0.5 % Apply 1 application topically 3 (three) times daily. APPLY FOR 4 WEEKS STOP FOR 1 WEEK & REPEAT AS NEEDED   unknown  . aspirin EC 325 MG tablet TAKE 1 TABLET BY MOUTH EVERY DAY (Patient not taking: Reported on 03/30/2019) 30 tablet 0 Not Taking at Unknown time   Scheduled:  . aspirin EC  81 mg Oral Daily  . Chlorhexidine Gluconate Cloth  6 each Topical Q0600  . clopidogrel  75 mg Oral Daily  . insulin aspart  1-3 Units Subcutaneous Q4H  . insulin detemir  24 Units Subcutaneous Q12H  . mupirocin ointment  1 application Nasal BID  . sodium chloride flush  3 mL Intravenous Q12H  . sodium chloride flush  3 mL Intravenous Q12H  .  sodium zirconium cyclosilicate  10 g Oral BID    Assessment: 54 yo male s/p CODE STEMI with troponin trend up to 5200. The patient is s/p cath 3/24 which showed mid PDA occlusion for medical management. The plan is for 72 hours heparin post-cath and pharmacy consulted to dose.    The patient's heparin level early this morning is SUBtherapeutic (HL<0.1). The patient's IV site came out and the drip was not infusing from 0200-0230 per discussion with RN, the lab was drawn around 0300. Despite this - the patient is likely still low since the drip was only paused for a brief period. Will increase and recheck in 6 hours.   Goal of Therapy:  Heparin level 0.3-0.7 units/ml Monitor platelets by anticoagulation protocol: Yes   Plan:  - Increase Heparin to 1650 units/hr (16.5 ml/hr) - Will continue to monitor for any signs/symptoms of bleeding and will follow up with heparin level in 6 hours   Thank you for allowing pharmacy to be a part of this patient's care.  Alycia Rossetti, PharmD, BCPS Clinical Pharmacist 04/01/2019 5:06 AM   **Pharmacist phone directory can now be found on [email or web address] ([email or web address]).  Listed under [place].

## 2019-04-01 NOTE — Progress Notes (Signed)
eLink Physician-Brief Progress Note Patient Name: Jesus Cruz DOB: [birth year] MRN: [id number]   Date of Service  04/01/2019  HPI/Events of Note  Blood glucosse = 275. Patient is currently on a Q 4 hour standard Novolog SSI and Levemir BID.  eICU Interventions  Will order: 1. Change to Q 4 hour resistant Novolog SSI.     Intervention Category Major Interventions: Hyperglycemia - active titration of insulin therapy  Lysle Dingwall 04/01/2019, 9:07 PM

## 2019-04-01 NOTE — Progress Notes (Signed)
ANTICOAGULATION CONSULT NOTE  Pharmacy Consult:  Heparin Indication: chest pain/ACS  Patient Measurements: Height: 5\' 7"  (170.2 cm) Weight: (!) 315 lb 0.6 oz (142.9 kg) IBW/kg (Calculated) : 66.1 Heparin Dosing Weight: 100kg  Vital Signs: Temp: 97.5 F (36.4 C) (03/25 1107) Temp Source: Axillary (03/25 1107) BP: 150/91 (03/25 1215) Pulse Rate: 91 (03/25 1215)  Labs: Recent Labs    03/30/19 1451 03/30/19 1456 03/30/19 1813 03/30/19 1824 03/30/19 1942 03/30/19 2209 03/31/19 0222 03/31/19 0308 03/31/19 0411 03/31/19 1604 03/31/19 2306 04/01/19 0305 04/01/19 1123  HGB 15.2   < >  --  15.6  --    < > 14.8  --   --   --   --  13.8  --   HCT 45.7   < >  --  46.0  --   --  42.5  --   --   --   --  41.5  --   PLT 454*  --   --   --   --   --  364  --   --   --   --  275  --   APTT  --   --   --   --   --   --   --  28  --   --   --   --   --   HEPARINUNFRC  --   --   --   --   --   --   --   --   --   --   --  <0.10* 0.29*  CREATININE 3.82*   < > 3.18*  --   --    < >  --   --    < > 2.06* 1.78* 1.64*  --   TROPONINIHS 2,304*  --  4,413*  --  5,272*  --   --   --   --   --   --   --   --    < > = values in this interval not displayed.    Estimated Creatinine Clearance: 70.5 mL/min (A) (by C-G formula based on SCr of 1.64 mg/dL (H)).   Assessment: 51 YOM s/p Code STEMI with troponin trend up to 5200. The patient is s/p cath 3/24 which showed mid PDA occlusion for medical management.  Pharmacy consulted to manage IV heparin post cath.  Heparin level is slightly below goal.  No issue with heparin infusion nor bleeding per RN.  Goal of Therapy:  Heparin level 0.3-0.7 units/ml Monitor platelets by anticoagulation protocol: Yes   Plan:  Increase heparin gtt to 1800 units/hr.  Stop heparin 3/26 around 12N per Cards. Daily heparin level and CBC  Jermale Crass D. Mina Marble, PharmD, BCPS, [place] 04/01/2019, 1:27 PM

## 2019-04-01 NOTE — Progress Notes (Signed)
eLink Physician-Brief Progress Note Patient Name: Jesus Cruz DOB: 1965/06/17 MRN: [id number]   Date of Service  04/01/2019  HPI/Events of Note  Pt with improved glycemic contol  eICU Interventions  Transition Insulin orders entered.        Kerry Kass Kani Chauvin 04/01/2019, 4:36 AM

## 2019-04-01 NOTE — Progress Notes (Signed)
Jesus Cruz Progress Note   Subjective:   I/Os yesterday 1.7 / 1.9.  No CRRT. Cr improved to 1.6. He continues to be a poor historian which I think is mainly due to dysarthria from oral pain.    Objective Vitals:   04/01/19 0700 04/01/19 0800 04/01/19 0804 04/01/19 1107  BP: (!) 114/58 (!) 121/107    Pulse: 93 92    Resp: 19 17    Temp:   98 F (36.7 C) (!) 97.5 F (36.4 C)  TempSrc:   Axillary Axillary  SpO2: 98% 94%    Weight:      Height:       Physical Exam General: obese man in no distress ENT:  Fair dentition, +thrush, torus palatini, tongue ulcer v excoriation Heart: tachy, no rub Lungs: normal WOB, dec BS bases, no rales appreciated Abdomen: soft, obese Extremities: 1+ LE edema, ~ unchanged from yest  Neuro:  Able to answer simple questions, seems neurologically intact Dialysis Access:  LIJ temp HD cath c/d/i  Additional Objective Labs: Basic Metabolic Panel: Recent Labs  Lab 03/31/19 0411 03/31/19 1023 03/31/19 1604 03/31/19 2306 04/01/19 0305  NA 133*   < > 136 136 135  K 5.5*   < > 5.8* 5.2* 5.1  CL 98   < > 102 105 103  CO2 25   < > 21* 23 22  GLUCOSE 235*   < > 154* 195* 151*  BUN 89*   < > 81* 69* 66*  CREATININE 2.60*   < > 2.06* 1.78* 1.64*  CALCIUM 8.9   < > 8.7* 8.6* 8.5*  PHOS 4.4  --  3.7  --  3.3   < > = values in this interval not displayed.   Liver Function Tests: Recent Labs  Lab 03/30/19 1451 03/30/19 1451 03/31/19 0411 03/31/19 1604 04/01/19 0305  AST 40  --   --   --   --   ALT 37  --   --   --   --   ALKPHOS 224*  --   --   --   --   BILITOT 0.9  --   --   --   --   PROT 7.4  --   --   --   --   ALBUMIN 3.0*   < > 2.5* 2.6* 2.3*   < > = values in this interval not displayed.   Recent Labs  Lab 03/30/19 1451  LIPASE 40   CBC: Recent Labs  Lab 03/30/19 1451 03/30/19 1456 03/30/19 1824 03/31/19 0222 04/01/19 0305  WBC 28.6*  --   --  33.8* 24.7*  NEUTROABS 25.5*  --   --   --   --   HGB 15.2   <  > 15.6 14.8 13.8  HCT 45.7   < > 46.0 42.5 41.5  MCV 91.4  --   --  88.9 92.6  PLT 454*  --   --  364 275   < > = values in this interval not displayed.   Blood Culture    Component Value Date/Time   SDES BLOOD LEFT FOREARM 03/30/2019 1647   SPECREQUEST  03/30/2019 1647    BOTTLES DRAWN AEROBIC AND ANAEROBIC Blood Culture adequate volume   CULT  03/30/2019 1647    NO GROWTH 2 DAYS Performed at [facility], [place] [address] [place], [place] [address]    REPTSTATUS PENDING 03/30/2019 1647    Cardiac Enzymes: No results for input(s):  CKTOTAL, CKMB, CKMBINDEX, TROPONINI in the last 168 hours. CBG: Recent Labs  Lab 04/01/19 0328 04/01/19 0336 04/01/19 0438 04/01/19 0801 04/01/19 1106  GLUCAP 162* 147* 168* 200* 192*   Iron Studies: No results for input(s): IRON, TIBC, TRANSFERRIN, FERRITIN in the last 72 hours. 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  Prox RCA lesion is 30% stenosed.  Previously placed Prox RCA to Mid RCA stent (unknown type) is widely patent.  Mid RCA lesion is 60% stenosed.  1st Diag/Ramus lesion is 40% stenosed.  Ramus/OM lesion is 50% stenosed.  1st Mrg/Distal LCx lesion is 60% stenosed.  LV end diastolic pressure is normal.  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  Does not appear to be true shock (likely related to sedation)  Hyperdynamic cardiac motion (very difficult to engage coronary arteries)  Large patulous left main with 4 major branches PLAN OF CARE:  Transfer back to MICU (2 M) -> we will restart IV heparin 8 hours  after sheath removal.  Would run IV heparin for total 72 hours including last night.  As needed pain meds  We will defer to consulting team, but could consider clopidogrel (we will load today)  We will defer management of renal failure to primary team/nephrology. Glenetta Hew, M.D., M.S. Interventional Cardiologist [address] [place], [place] [address]  US RENAL  Result Date: 03/30/2019 CLINICAL DATA:  Acute kidney injury EXAM: RENAL / URINARY TRACT ULTRASOUND COMPLETE COMPARISON:  CT abdomen 01/27/2017 FINDINGS: Right Kidney: Renal measurements: 8.5 by 4.8 by 4.2 cm = volume: 90 mL . Echogenicity within normal limits. No mass or hydronephrosis visualized. Scarring in the right kidney most strikingly in the upper pole. Left Kidney: The left kidney cannot be visualized sonographically. Bladder: Not visualized sonographically. Other: Limiting factors included patient's sedation and patient body habitus. IMPRESSION: 1. Considerably atrophic right kidney. 2. Nonvisualization of the left kidney and urinary bladder. Electronically Signed   By: Van Clines M.D.   On: 03/30/2019 21:21   DG CHEST PORT 1 VIEW  Result Date: 03/30/2019 CLINICAL DATA:  Central line placement. Acute kidney injury. Hyperkalemia. Pulmonary edema. EXAM: PORTABLE CHEST 1 VIEW COMPARISON:  Chest x-rays dated 03/30/2019 and 03/23/2018 FINDINGS: Double lumen central venous catheter has been inserted. The tip is overlying the superior vena cava just below the carina in good position. Persistent pulmonary vascular congestion and mild perihilar haziness consistent with mild edema. Mild bibasilar atelectasis. No pneumothorax. No acute bone abnormality. Thoracic spinal cord stimulator in place. IMPRESSION: Central line in good position. No pneumothorax. Persistent pulmonary vascular congestion and mild perihilar edema. Electronically Signed   By: Lorriane Shire M.D.   On: 03/30/2019 18:38   DG Chest Portable 1  View  Result Date: 03/30/2019 CLINICAL DATA:  Chest pain and shortness of breath EXAM: PORTABLE CHEST 1 VIEW COMPARISON:  March 23, 2018 FINDINGS: Heart is enlarged with pulmonary venous hypertension. There is interstitial edema. No appreciable consolidation. No pleural effusions evident. No adenopathy. Stimulator leads are in the lower thoracic region, stable. No pneumothorax. No bone lesions. IMPRESSION: Cardiomegaly with pulmonary vascular congestion. Interstitial edema. Appearance indicative of congestive  heart failure. No consolidation. Electronically Signed   By: Lowella Grip III M.D.   On: 03/30/2019 15:08   ECHOCARDIOGRAM COMPLETE  Result Date: 03/31/2019    ECHOCARDIOGRAM REPORT   Patient Name:   Jesus Cruz Date of Exam: 03/31/2019 Medical Rec #:  [id number]    Height:       67.0 in Accession #:    [id number]   Weight:       314.4 lb Date of Birth:  Jan 03, 1966     BSA:          2.451 m Patient Age:    23 years     BP:           101/74 mmHg Patient Gender: M            HR:           101 bpm. Exam Location:  Inpatient Procedure: 2D Echo, Cardiac Doppler and Color Doppler Indications:    Chest pain 786.50/R07.9  History:        Patient has prior history of Echocardiogram examinations, most                 recent 09/17/2013. CAD; Risk Factors:Hypertension. Acute MI.                 Acute renal failure. H/o CVA.  Sonographer:    Clayton Lefort RDCS (AE) Referring Phys: 1993 [facility] G BARRETT  Sonographer Comments: Suboptimal parasternal window, suboptimal apical window, suboptimal subcostal window and patient is morbidly obese. Image acquisition challenging due to patient body habitus. IMPRESSIONS  1. Small area of inferior basal hypokinesis. Left ventricular ejection fraction, by estimation, is 55 to 60%. The left ventricle has normal function. Left ventricular endocardial border not optimally defined to evaluate regional wall motion. There is mild left ventricular hypertrophy. Left ventricular diastolic  parameters were normal.  2. Right ventricular systolic function is normal. The right ventricular size is normal.  3. The mitral valve was not well visualized. No evidence of mitral valve regurgitation. No evidence of mitral stenosis.  4. The aortic valve was not well visualized. Aortic valve regurgitation is not visualized. No aortic stenosis is present.  5. The inferior vena cava is normal in size with greater than 50% respiratory variability, suggesting right atrial pressure of 3 mmHg. FINDINGS  Left Ventricle: Small area of inferior basal hypokinesis. Left ventricular ejection fraction, by estimation, is 55 to 60%. The left ventricle has normal function. Left ventricular endocardial border not optimally defined to evaluate regional wall motion. The left ventricular internal cavity size was normal in size. There is mild left ventricular hypertrophy. Left ventricular diastolic parameters were normal. Right Ventricle: The right ventricular size is normal. No increase in right ventricular wall thickness. Right ventricular systolic function is normal. Left Atrium: Left atrial size was normal in size. Right Atrium: Right atrial size was normal in size. Pericardium: There is no evidence of pericardial effusion. Mitral Valve: The mitral valve was not well visualized. There is mild thickening of the mitral valve leaflet(s). There is mild calcification of the mitral valve leaflet(s). Normal mobility of the mitral valve leaflets. No evidence of mitral valve regurgitation. No evidence of mitral valve stenosis. Tricuspid Valve: The tricuspid valve is normal in structure. Tricuspid valve regurgitation is not demonstrated. No evidence of tricuspid stenosis. Aortic Valve: The aortic valve was not well visualized. Aortic valve regurgitation is not visualized. No aortic stenosis is present. Aortic valve mean gradient measures 3.0 mmHg. Aortic valve peak  gradient measures 5.3 mmHg. Aortic valve area, by VTI measures 4.55 cm.  Pulmonic Valve: The pulmonic valve was normal in structure. Pulmonic valve regurgitation is not visualized. No evidence of pulmonic stenosis. Aorta: The aortic root is normal in size and structure. Venous: The inferior vena cava is normal in size with greater than 50% respiratory variability, suggesting right atrial pressure of 3 mmHg. IAS/Shunts: No atrial level shunt detected by color flow Doppler.  LEFT VENTRICLE PLAX 2D LVIDd:         3.70 cm  Diastology LVIDs:         2.70 cm  LV e' lateral:   4.57 cm/s LV PW:         1.50 cm  LV E/e' lateral: 13.9 LV IVS:        1.20 cm  LV e' medial:    5.22 cm/s LVOT diam:     2.30 cm  LV E/e' medial:  12.2 LV SV:         72 LV SV Index:   29 LVOT Area:     4.15 cm  RIGHT VENTRICLE             IVC RV Basal diam:  2.60 cm     IVC diam: 1.90 cm RV S prime:     17.60 cm/s TAPSE (M-mode): 1.9 cm LEFT ATRIUM             Index       RIGHT ATRIUM          Index LA diam:        1.60 cm 0.65 cm/m  RA Area:     9.70 cm LA Vol (A2C):   41.2 ml 16.81 ml/m RA Volume:   18.10 ml 7.39 ml/m LA Vol (A4C):   49.8 ml 20.32 ml/m LA Biplane Vol: 48.3 ml 19.71 ml/m  AORTIC VALVE AV Area (Vmax):    3.76 cm AV Area (Vmean):   3.68 cm AV Area (VTI):     4.55 cm AV Vmax:           115.00 cm/s AV Vmean:          88.900 cm/s AV VTI:            0.159 m AV Peak Grad:      5.3 mmHg AV Mean Grad:      3.0 mmHg LVOT Vmax:         104.00 cm/s LVOT Vmean:        78.700 cm/s LVOT VTI:          0.174 m LVOT/AV VTI ratio: 1.09  AORTA Ao Root diam: 3.20 cm MITRAL VALVE MV Area (PHT): 2.26 cm    SHUNTS MV Decel Time: 335 msec    Systemic VTI:  0.17 m MV E velocity: 63.50 cm/s  Systemic Diam: 2.30 cm MV A velocity: 79.50 cm/s MV E/A ratio:  0.80 Jenkins Rouge MD Electronically signed by Jenkins Rouge MD Signature Date/Time: 03/31/2019/2:11:49 PM    Final    VAS [place] LOWER EXTREMITY VENOUS (DVT)  Result Date: 03/31/2019  Lower Venous DVTStudy Indications: Edema, and SOB.  Limitations: Body habitus and poor  ultrasound/tissue interface. Comparison Study: No prior exam. Performing Technologist: Baldwin Crown ARDMS, RVT  Examination Guidelines: A complete evaluation includes B-mode imaging, spectral Doppler, color Doppler, and power Doppler as needed of all accessible portions of each vessel. Bilateral testing is considered an integral part of a complete examination. Limited examinations for reoccurring indications may be  performed as noted. The reflux portion of the exam is performed with the patient in reverse Trendelenburg.  +---------+---------------+---------+-----------+----------+------------------+ RIGHT    CompressibilityPhasicitySpontaneityPropertiesThrombus Aging     +---------+---------------+---------+-----------+----------+------------------+ CFV      Full           Yes      Yes                                     +---------+---------------+---------+-----------+----------+------------------+ SFJ      Full                                                            +---------+---------------+---------+-----------+----------+------------------+ FV Prox  Full                                                            +---------+---------------+---------+-----------+----------+------------------+ FV Mid   Full                                                            +---------+---------------+---------+-----------+----------+------------------+ FV DistalFull                                                            +---------+---------------+---------+-----------+----------+------------------+ PFV      Full                                                            +---------+---------------+---------+-----------+----------+------------------+ POP      Full           Yes      Yes                                     +---------+---------------+---------+-----------+----------+------------------+ PTV      Full                                                             +---------+---------------+---------+-----------+----------+------------------+ PERO                                                  visualized with  color              +---------+---------------+---------+-----------+----------+------------------+   +---------+---------------+---------+-----------+----------+------------------+ LEFT     CompressibilityPhasicitySpontaneityPropertiesThrombus Aging     +---------+---------------+---------+-----------+----------+------------------+ CFV      Full           Yes      Yes                                     +---------+---------------+---------+-----------+----------+------------------+ SFJ      Full                                                            +---------+---------------+---------+-----------+----------+------------------+ FV Prox  Full                                                            +---------+---------------+---------+-----------+----------+------------------+ FV Mid   Full                                                            +---------+---------------+---------+-----------+----------+------------------+ FV DistalFull                                                            +---------+---------------+---------+-----------+----------+------------------+ PFV      Full                                                            +---------+---------------+---------+-----------+----------+------------------+ POP      Full           Yes      Yes                                     +---------+---------------+---------+-----------+----------+------------------+ PTV      Full                                                            +---------+---------------+---------+-----------+----------+------------------+ PERO                                                  visualized with  color              +---------+---------------+---------+-----------+----------+------------------+     Summary: RIGHT: - There is no evidence of deep vein thrombosis in the lower extremity. However, portions of this examination were limited- see technologist comments above.  - No cystic structure found in the popliteal fossa. - Poorly visualized calf veins.  LEFT: - There is no evidence of deep vein thrombosis in the lower extremity. However, portions of this examination were limited- see technologist comments above.  - No cystic structure found in the popliteal fossa. - Poorly visualized calf veins.  *See table(s) above for measurements and observations. Electronically signed by Ruta Hinds MD on 03/31/2019 at 4:01:49 PM.    Final    Medications: . sodium chloride    . sodium chloride 10 mL/hr at 04/01/19 0800  . sodium chloride    . ceFEPime (MAXIPIME) IV 2 g (04/01/19 1057)  . heparin 1,650 Units/hr (04/01/19 0800)   . aspirin EC  81 mg Oral Daily  . Chlorhexidine Gluconate Cloth  6 each Topical Q0600  . clopidogrel  75 mg Oral Daily  . insulin aspart  3-9 Units Subcutaneous Q4H  . insulin detemir  10 Units Subcutaneous BID  . mupirocin ointment  1 application Nasal BID  . sodium chloride flush  3 mL Intravenous Q12H  . sodium chloride flush  3 mL Intravenous Q12H  . sodium zirconium cyclosilicate  10 g Oral BID   Assessment/Plan **Hyperkalemia:  life threatining, >8 with WCT, resolved. D/c lokelma.  **AKI on CKD:  Per report baseline 3 but I think he meant CKD stage 3. Admission Cr 3.5.  Bladder scan normal but ? Error due to pannus/habitus --> had 2L when foley inserted and Cr improved.  Down to 1.6 today.  Renal US limited study -- R atrophic, L not visualized. UA with small blood, no protein after foley inserted; FeNa c/w intrinsic.  Had contrast yesterday but no c/w CIN at this point.  I would keep HD catheter for 1 more day but  if stable or improved tomorrow d/c.  If oliguric/worsening renal function call us back.  **DM:  Severe hyperglycemia on arrival now improved on insulin gtt per primary  **hypoxic resp failure: Improving; had edema on CXR, diuresing.  **Hyponatremia: corrected to nearly normal for BG.  Improved.  **Sepsis:  WBC 30 > 25,  Blood cultures pending. Antibiotics per primary.   **CAD: s/p LHC 3/24 with PDA occulsion; med mgmt per cardiology. No ACEi/ARB for now.   Will sign off, call with questions/concerns.   Jannifer Hick MD 04/01/2019, 11:41 AM  [facility] Pager: [phone]

## 2019-04-01 NOTE — Evaluation (Signed)
Physical Therapy Evaluation Patient Details Name: Jesus Cruz MRN: [id number] DOB: [birth year] Today's Date: 04/01/2019   History of Present Illness  54 year old male with past medical history significant for CAD, hypertension, and prior CVA, admitted with complaints of chest pain as a code STEMI found to have hyperkalemia, acute renal failure, and DKA. Pt uinderwent LHC on 3/24 with recommendation for medical management.  Clinical Impression  Pt presents to PT with deficits in functional mobility, balance, gait, endurance, power, strength, sensation. Pt currently requires limited to no physical assistance to mobilize in bed and transfer a this time. Pt is a limited ambulator, typically utilizing a wheelchair for household distances, and will benefit from assessment of wheelchair mobility at next session. Pt emotionally labile during session, tearful, concerned about his health and being able to care for his mother in the future. Pt will benefit from continued acute PT POC to improve activity tolerance, balance, and strength, in order to restore the patient's prior level of function.    Follow Up Recommendations Home health PT;Supervision for mobility/OOB    Equipment Recommendations  None recommended by PT(TBD)    Recommendations for Other Services       Precautions / Restrictions Precautions Precautions: Fall Restrictions Weight Bearing Restrictions: No      Mobility  Bed Mobility Overal bed mobility: Needs Assistance Bed Mobility: Supine to Sit     Supine to sit: Supervision     General bed mobility comments: use of bed rails  Transfers Overall transfer level: Needs assistance   Transfers: Stand Pivot Transfers   Stand pivot transfers: Min assist       General transfer comment: PT providing direction from bed to recliner with tactile cues. Pt bending over and utilizing support of arm rest of recliner for UE support  Ambulation/Gait                Stairs             Wheelchair Mobility    Modified Rankin (Stroke Patients Only)       Balance Overall balance assessment: Needs assistance Sitting-balance support: No upper extremity supported;Feet supported Sitting balance-Leahy Scale: Good Sitting balance - Comments: supervision   Standing balance support: Bilateral upper extremity supported Standing balance-Leahy Scale: Fair Standing balance comment: minG with UE support of recliner                             Pertinent Vitals/Pain Pain Assessment: 0-10 Pain Score: 7  Pain Location: R knee Pain Descriptors / Indicators: Aching Pain Intervention(s): Limited activity within patient's tolerance    Home Living Family/patient expects to be discharged to:: Private residence Living Arrangements: Parent;Other (Comment)(hired care) Available Help at Discharge: Other (Comment)(hired care, currently 24/7, typically 8 hrs daily) Type of Home: House Home Access: Ramped entrance     Home Layout: One level Home Equipment: Wheelchair - manual;Crutches;Walker - 2 wheels;Other (comment);Grab bars - toilet;Grab bars - tub/shower(mechanical lift) Additional Comments: pt helps care for mother    Prior Function Level of Independence: Independent with assistive device(s)         Comments: pt performs SPT from bed to wheelchair, utilizes wheelchair for household mobility. Pt reports being able to ambulate short distances with crutches in the community     Hand Dominance        Extremity/Trunk Assessment   Upper Extremity Assessment Upper Extremity Assessment: Generalized weakness    Lower Extremity Assessment Lower Extremity Assessment: Generalized  weakness(pt with wounds on both feet, dec sensation R foot)    Cervical / Trunk Assessment Cervical / Trunk Assessment: Other exceptions Cervical / Trunk Exceptions: morbid obesity  Communication   Communication: No difficulties  Cognition Arousal/Alertness:  Awake/alert Behavior During Therapy: (labile, tearful during session) Overall Cognitive Status: Within Functional Limits for tasks assessed                                        General Comments General comments (skin integrity, edema, etc.): pt on 4L [place] upon PT arrival, weaned to room air during mobility with sats 94-95%.    Exercises     Assessment/Plan    PT Assessment Patient needs continued PT services  PT Problem List Decreased strength;Decreased activity tolerance;Decreased balance;Decreased mobility;Decreased knowledge of use of DME;Decreased safety awareness;Decreased knowledge of precautions;Pain;Impaired sensation       PT Treatment Interventions DME instruction;Gait training;Stair training;Functional mobility training;Therapeutic activities;Therapeutic exercise;Balance training;Neuromuscular re-education;Patient/family education    PT Goals (Current goals can be found in the Care Plan section)  Acute Rehab PT Goals Patient Stated Goal: To improve mobility and get healthy to take care of mother PT Goal Formulation: With patient Time For Goal Achievement: 04/15/19 Potential to Achieve Goals: Good Additional Goals Additional Goal #1: Pt will mobilize in manual wheelchair for 50' with supervision.    Frequency Min 3X/week   Barriers to discharge        Co-evaluation               AM-PAC PT "6 Clicks" Mobility  Outcome Measure Help needed turning from your back to your side while in a flat bed without using bedrails?: None Help needed moving from lying on your back to sitting on the side of a flat bed without using bedrails?: None Help needed moving to and from a bed to a chair (including a wheelchair)?: A Little Help needed standing up from a chair using your arms (e.g., wheelchair or bedside chair)?: A Little Help needed to walk in hospital room?: A Lot Help needed climbing 3-5 steps with a railing? : Total 6 Click Score: 17    End of  Session Equipment Utilized During Treatment: Oxygen Activity Tolerance: Patient tolerated treatment well Patient left: in chair;with call bell/phone within reach Nurse Communication: Mobility status PT Visit Diagnosis: Other abnormalities of gait and mobility (R26.89);Muscle weakness (generalized) (M62.81);Pain Pain - Right/Left: Right Pain - part of body: Knee    Time: [id number] PT Time Calculation (min) (ACUTE ONLY): 24 min   Charges:   PT Evaluation $PT Eval Moderate Complexity: 1 Mod          Zenaida Niece, PT, DPT Acute Rehabilitation Pager: [phone]   Zenaida Niece 04/01/2019, 5:49 PM

## 2019-04-01 NOTE — Progress Notes (Signed)
PHARMACY NOTE:  ANTIMICROBIAL RENAL DOSAGE ADJUSTMENT  Current antimicrobial regimen includes a mismatch between antimicrobial dosage and estimated renal function.  As per policy approved by the Pharmacy & Therapeutics and Medical Executive Committees, the antimicrobial dosage will be adjusted accordingly.  Current antimicrobial dosage:  Cefepime 2gm IV Q12H  Indication: sepsis  Renal Function:  Estimated Creatinine Clearance: 70.5 mL/min (A) (by C-G formula based on SCr of 1.64 mg/dL (H)). []      On intermittent HD, scheduled: []      On CRRT Off CRRT and SCr improving, UOP 0.6 ml/kg/hr    Antimicrobial dosage has been changed to:  Cefepime 2gm IV Q8H  Additional comments:  Continue to monitor renal fxn, UOP   Martin Belling D. Mina Marble, PharmD, BCPS, [place] 04/01/2019, 8:49 AM

## 2019-04-02 ENCOUNTER — Inpatient Hospital Stay (HOSPITAL_COMMUNITY): Payer: Medicare Other

## 2019-04-02 LAB — BASIC METABOLIC PANEL
Anion gap: 8 (ref 5–15)
BUN: 38 mg/dL — ABNORMAL HIGH (ref 6–20)
CO2: 22 mmol/L (ref 22–32)
Calcium: 8.6 mg/dL — ABNORMAL LOW (ref 8.9–10.3)
Chloride: 104 mmol/L (ref 98–111)
Creatinine, Ser: 1.25 mg/dL — ABNORMAL HIGH (ref 0.61–1.24)
GFR calc Af Amer: >60 mL/min (ref >60)
GFR calc non Af Amer: >60 mL/min (ref >60)
Glucose, Bld: 138 mg/dL — ABNORMAL HIGH (ref 70–99)
Potassium: 5.3 mmol/L — ABNORMAL HIGH (ref 3.5–5.1)
Sodium: 134 mmol/L — ABNORMAL LOW (ref 135–145)

## 2019-04-02 LAB — PROCALCITONIN: Procalcitonin: 0.29 ng/mL

## 2019-04-02 LAB — RENAL FUNCTION PANEL
Albumin: 2.5 g/dL — ABNORMAL LOW (ref 3.5–5.0)
Anion gap: 9 (ref 5–15)
BUN: 45 mg/dL — ABNORMAL HIGH (ref 6–20)
CO2: 25 mmol/L (ref 22–32)
Calcium: 8.9 mg/dL (ref 8.9–10.3)
Chloride: 105 mmol/L (ref 98–111)
Creatinine, Ser: 1.35 mg/dL — ABNORMAL HIGH (ref 0.61–1.24)
GFR calc Af Amer: >60 mL/min (ref >60)
GFR calc non Af Amer: 59 mL/min — ABNORMAL LOW (ref >60)
Glucose, Bld: 96 mg/dL (ref 70–99)
Phosphorus: 3.5 mg/dL (ref 2.5–4.6)
Potassium: 5.6 mmol/L — ABNORMAL HIGH (ref 3.5–5.1)
Sodium: 139 mmol/L (ref 135–145)

## 2019-04-02 LAB — HEPATIC FUNCTION PANEL
ALT: 32 U/L (ref 0–44)
AST: 33 U/L (ref 15–41)
Albumin: 2.6 g/dL — ABNORMAL LOW (ref 3.5–5.0)
Alkaline Phosphatase: 105 U/L (ref 38–126)
Bilirubin, Direct: <0.1 mg/dL (ref 0.0–0.2)
Total Bilirubin: 1 mg/dL (ref 0.3–1.2)
Total Protein: 7 g/dL (ref 6.5–8.1)

## 2019-04-02 LAB — MAGNESIUM: Magnesium: 2.2 mg/dL (ref 1.7–2.4)

## 2019-04-02 LAB — GLUCOSE, CAPILLARY
Glucose-Capillary: 98 mg/dL (ref 70–99)
Glucose-Capillary: 196 mg/dL — ABNORMAL HIGH (ref 70–99)
Glucose-Capillary: 268 mg/dL — ABNORMAL HIGH (ref 70–99)
Glucose-Capillary: 127 mg/dL — ABNORMAL HIGH (ref 70–99)
Glucose-Capillary: 198 mg/dL — ABNORMAL HIGH (ref 70–99)

## 2019-04-02 LAB — CBC
HCT: 44.2 % (ref 39.0–52.0)
Hemoglobin: 14.2 g/dL (ref 13.0–17.0)
MCH: 30.4 pg (ref 26.0–34.0)
MCHC: 32.1 g/dL (ref 30.0–36.0)
MCV: 94.6 fL (ref 80.0–100.0)
Platelets: 265 10*3/uL (ref 150–400)
RBC: 4.67 MIL/uL (ref 4.22–5.81)
RDW: 12.7 % (ref 11.5–15.5)
WBC: 17.6 10*3/uL — ABNORMAL HIGH (ref 4.0–10.5)
nRBC: 0 % (ref 0.0–0.2)

## 2019-04-02 LAB — HEPARIN LEVEL (UNFRACTIONATED): Heparin Unfractionated: 0.21 IU/mL — ABNORMAL LOW (ref 0.30–0.70)

## 2019-04-02 LAB — LIPASE, BLOOD: Lipase: 55 U/L — ABNORMAL HIGH (ref 11–51)

## 2019-04-02 MED ORDER — SODIUM ZIRCONIUM CYCLOSILICATE 5 G PO PACK
5.0000 g | PACK | Freq: Two times a day (BID) | ORAL | Status: AC
  Administered 2019-04-02 (×2): 5 g via ORAL
  Filled 2019-04-02 (×2): qty 1

## 2019-04-02 MED ORDER — ATORVASTATIN CALCIUM 10 MG PO TABS
10.0000 mg | ORAL_TABLET | Freq: Every day | ORAL | Status: DC
  Administered 2019-04-02 – 2019-04-06 (×5): 10 mg via ORAL
  Filled 2019-04-02 (×5): qty 1

## 2019-04-02 MED ORDER — IOHEXOL 300 MG/ML  SOLN
100.0000 mL | Freq: Once | INTRAMUSCULAR | Status: AC | PRN
  Administered 2019-04-02: 100 mL via INTRAVENOUS

## 2019-04-02 MED ORDER — PANTOPRAZOLE SODIUM 40 MG IV SOLR: 40.0000 mg | INTRAVENOUS | Status: DC

## 2019-04-02 MED ORDER — INSULIN STARTER KIT- PEN NEEDLES (ENGLISH)
1.0000 | Freq: Once | Status: AC
  Administered 2019-04-02: 1
  Filled 2019-04-02: qty 1

## 2019-04-02 MED ORDER — LIVING WELL WITH DIABETES BOOK
Freq: Once | Status: AC
  Filled 2019-04-02: qty 1

## 2019-04-02 MED ORDER — FLUCONAZOLE IN SODIUM CHLORIDE 200-0.9 MG/100ML-% IV SOLN
200.0000 mg | INTRAVENOUS | Status: AC
  Administered 2019-04-02 – 2019-04-06 (×5): 200 mg via INTRAVENOUS
  Filled 2019-04-02 (×5): qty 100

## 2019-04-02 MED ORDER — METOCLOPRAMIDE HCL 5 MG/ML IJ SOLN
10.0000 mg | Freq: Once | INTRAMUSCULAR | Status: AC
  Administered 2019-04-02: 10 mg via INTRAVENOUS
  Filled 2019-04-02: qty 2

## 2019-04-02 MED ORDER — AMLODIPINE BESYLATE 10 MG PO TABS
10.0000 mg | ORAL_TABLET | Freq: Every day | ORAL | Status: DC
  Administered 2019-04-02 – 2019-04-07 (×6): 10 mg via ORAL
  Filled 2019-04-02 (×6): qty 1

## 2019-04-02 MED ORDER — PANTOPRAZOLE SODIUM 40 MG IV SOLR
40.0000 mg | Freq: Two times a day (BID) | INTRAVENOUS | Status: DC
  Administered 2019-04-02 – 2019-04-07 (×11): 40 mg via INTRAVENOUS
  Filled 2019-04-02 (×11): qty 40

## 2019-04-02 MED ORDER — ALUM & MAG HYDROXIDE-SIMETH 200-200-20 MG/5ML PO SUSP
30.0000 mL | ORAL | Status: AC
  Administered 2019-04-02: 30 mL via ORAL
  Filled 2019-04-02: qty 30

## 2019-04-02 NOTE — Progress Notes (Signed)
PROGRESS NOTE    Jesus Cruz  AGT:[id number] DOB: Dec 10, 1965 DOA: 03/30/2019 PCP: Garvin Fila, MD   Brief Narrative: 54 year old gentleman with complex comorbidities including CAD ST elevation MI in 2014, hypertension, PE, OSA on CPAP, obesity, hypertension, history of stroke, diabetes mellitus presented via EMS with complaint of chest pain, code STEMI-with complaint of shortness of breath, diaphoresis on arrival, seen by cardiology found to have renal failure hyperkalemia, DKA-was admitted to ICU and managed by PCCM/nephrology/cardiology and underwent cardiac cath 3/24 for inferior ST EMI-occlusion of the PDA other vessels were patent Echo with EF 55 to 60% and continued on medical management aspirin Plavix, heparin. Patient is managed for hyperglycemia, hyperkalemia, MI, volume overload, plan was to start CRRT but he responded well to Lasix.  Patient was stabilized and transferred to [facility] admit 3/26.  Subjective:  This am- ate breakfast and after that c/o abdominal discomfort and pain- has been having post prandial pain like this for few weeks, was taking tums at home. Reports he had ulcer in 2016. Appears uncomfortable this am, anxious, nauseous, trying to vomit, some chest tightness but not new. Hemodynamically stable-not tachycardic and tachypneic, on 2l [place].  Overnight HD catheter got pulled by accident- rt neck w/ dressing in place. Labs this morning with potassium 5.6, improving creatinine 1.3 BUN 45 Blood glucose variable 98-275 Foley + uop 1475 ML   Assessment & Plan:  CAD/inferior ST elevation MI: Status post cardiac cath 3/24,occlusion of the PDA other vessels were patent Echo with EF 55 to 60% and continued on medical management aspirin Plavix, metoprolol 150 mg.  Continue plan as per cardiology.  Not on a statin check lipid panel, AND CONSIDER ONE.  Acute renal failure and CKD/hyperkalemia-reported baseline CKD stage III (unclear type a or b): Nephrology on board  creatinine improving potassium this morning.  HD catheter got pulled out overnight. Recent Labs  Lab 03/31/19 1023 03/31/19 1604 03/31/19 2306 04/01/19 0305 04/02/19 0355  BUN 88* 81* 69* 66* 45*  CREATININE 2.33* 2.06* 1.78* 1.64* 1.35*   Acute hypoxic respiratory failure secondary to volume overload: On nasal cannula oxygen, fluid management as per nephrology, continue supplemental oxygen and wean as tolerated.  Intermittent diuresis per nephrology. Chest x-ray intermittently- cxr this am no acute abnormalities, on 2l [place]- continue to wean off.  DM with HHS -HHS resolved: Poorly controlled hemoglobin A1c 12.0 (03/2319), blood sugar overall is stable on Lantus 10 units twice daily and sliding scale insulin.  Diabetic cardiology on board recommending NovoLog 4 u, 3 times daily Premeal if more than 50% intake. Monitor and adjust.  Recent Labs  Lab 04/01/19 1648 04/01/19 2032 04/01/19 2325 04/02/19 0415 04/02/19 0828  GLUCAP 244* 275* 121* 98 196*   Abdominal discomfort pain- post prandial- for few weeks- add ppi q12 hr, diflucan for thrushs and reglan x1, check xray abd. reports hx of ulcer in 2016- used to see Dr Thana Farr.  Discussed Dr. Craig Guess will see for the patient in consultation, and consider CT abdomen.  Check lipase, was normal previously.  Supportive care.  Essential hypertension: Not be self borderline.  History of PE-DVT ultrasound negative less likely for PE can consider VQ scan if ongoing concern.Patient on heparin drip for his MI, does not appears to be on anticoagulation prior to admission.  LE wounds/wounds and- cont wound care  Left knee pain- reports he had steroid recently and supposed to have another few days ago. Appears tender. Monitor cont tylenol/pain control.  Leukocytosis: 28.6 on admission but  afebrile- improving.? Reactive versus possible LLE cellulitis.  Percocet 0.4 subsequently 0.4 and antibiotic cefepime was discontinued in ICU. Recent Labs  Lab  03/30/19 1451 03/31/19 0222 04/01/19 0305 04/02/19 0355  WBC 28.6* 33.8* 24.7* 17.6*   Plaque in posterior pharynx : Start on Diflucan empirically.    Deconditioning/debility : PT OT eval and ambulation  Morbid obesity BMI 49.3 he will benefit with weight loss.  Nutrition: Diet Order            Diet Heart Room service appropriate? Yes; Fluid consistency: Thin  Diet effective now              Pressure Ulcer: Pressure Injury 03/31/19 Heel Right;Medial Stage 2 -  Partial thickness loss of dermis presenting as a shallow open injury with a red, pink wound bed without slough. (Active)  03/31/19 1104  Location: Heel  Location Orientation: Right;Medial  Staging: Stage 2 -  Partial thickness loss of dermis presenting as a shallow open injury with a red, pink wound bed without slough.  Wound Description (Comments):   Present on Admission: Yes    DVT prophylaxis:Heparin Code Status: FULL Family Communication: plan of care discussed with patient at bedside. Disposition Plan: Patient is from:HOME Anticipated Disposition: to tbd Barriers to discharge or conditions that needs to be met prior to discharge: Patient admitted with ST elevation MI renal failure hypoglycemia in ICU transferred to medical floor on 3/26.  Consultants:see note  Procedures:  Left IJ HD cath 3/23- got pulled out 3/26  Echo 03/31/19: 1. Small area of inferior basal hypokinesis. Left ventricular ejection  fraction, by estimation, is 55 to 60%. The left ventricle has normal  function. Left ventricular endocardial border not optimally defined to  evaluate regional wall motion. There is  mild left ventricular hypertrophy. Left ventricular diastolic parameters  were normal.  2. Right ventricular systolic function is normal. The right ventricular  size is normal.  3. The mitral valve was not well visualized. No evidence of mitral valve  regurgitation. No evidence of mitral stenosis.  4. The aortic valve  was not well visualized. Aortic valve regurgitation  is not visualized. No aortic stenosis is present.  5. The inferior vena cava is normal in size with greater than 50%  respiratory variability, suggesting right atrial pressure of 3 mmHg.   3/24 B/L DVT US >> RIGHT:  - There is no evidence of deep vein thrombosis in the lower extremity.  However, portions of this examination were limited- see technologist  comments above.  - No cystic structure found in the popliteal fossa.  - Poorly visualized calf veins.    LEFT:  - There is no evidence of deep vein thrombosis in the lower extremity.  However, portions of this examination were limited- see technologist  comments above.  - No cystic structure found in the popliteal fossa.  - Poorly visualized calf veins.   3/24 LHC >>  LIKELY CULPRIT LESION: Mid PDA (small caliber) 100% -> not good PCI target.  Prox RCA lesion is 30% stenosed.  Previously placed Prox RCA to Mid RCA stent (unknown type) is widely patent.  Mid RCA lesion is 60% stenosed.  1st Diag/Ramus lesion is 40% stenosed.  Ramus/OM lesion is 50% stenosed.  1st Mrg/Distal LCx lesion is 60% stenosed.  LV end diastolic pressure is normal.  Microbiology:see note   Medications: Scheduled Meds: . alum & mag hydroxide-simeth  30 mL Oral STAT  . aspirin EC  81 mg Oral Daily  . Chlorhexidine Gluconate  Cloth  6 each Topical [id number]  . clopidogrel  75 mg Oral Daily  . insulin aspart  0-20 Units Subcutaneous Q4H  . insulin detemir  10 Units Subcutaneous BID  . metoprolol succinate  150 mg Oral Daily  . mupirocin ointment  1 application Nasal BID  . pantoprazole (PROTONIX) IV  40 mg Intravenous Q24H  . sodium chloride flush  3 mL Intravenous Q12H  . sodium chloride flush  3 mL Intravenous Q12H   Continuous Infusions: . sodium chloride    . heparin 2,000 Units/hr (04/02/19 0556)    Antimicrobials: Anti-infectives (From admission, onward)   Start     Dose/Rate  Route Frequency Ordered Stop   04/01/19 1000  ceFEPIme (MAXIPIME) 2 g in sodium chloride 0.9 % 100 mL IVPB  Status:  Discontinued     2 g 200 mL/hr over 30 Minutes Intravenous Every 8 hours 04/01/19 0850 04/01/19 1156   03/31/19 1700  vancomycin (VANCOREADY) IVPB 1500 mg/300 mL  Status:  Discontinued     1,500 mg 150 mL/hr over 120 Minutes Intravenous Every 24 hours 03/30/19 1619 03/31/19 1012   03/31/19 1015  vancomycin (VANCOCIN) 2,500 mg in sodium chloride 0.9 % 500 mL IVPB  Status:  Discontinued     2,500 mg 250 mL/hr over 120 Minutes Intravenous  Once 03/31/19 1012 03/31/19 1225   03/30/19 1630  vancomycin (VANCOCIN) 2,500 mg in sodium chloride 0.9 % 500 mL IVPB  Status:  Discontinued     2,500 mg 250 mL/hr over 120 Minutes Intravenous  Once 03/30/19 1619 03/31/19 1012   03/30/19 1630  ceFEPIme (MAXIPIME) 2 g in sodium chloride 0.9 % 100 mL IVPB  Status:  Discontinued     2 g 200 mL/hr over 30 Minutes Intravenous Every 12 hours 03/30/19 1619 04/01/19 0850   03/30/19 1615  ceFEPIme (MAXIPIME) 2 g in sodium chloride 0.9 % 100 mL IVPB  Status:  Discontinued     2 g 200 mL/hr over 30 Minutes Intravenous  Once 03/30/19 1608 03/30/19 1619   03/30/19 1615  metroNIDAZOLE (FLAGYL) IVPB 500 mg     500 mg 100 mL/hr over 60 Minutes Intravenous  Once 03/30/19 1608 03/30/19 1837   03/30/19 1615  vancomycin (VANCOCIN) IVPB 1000 mg/200 mL premix  Status:  Discontinued     1,000 mg 200 mL/hr over 60 Minutes Intravenous  Once 03/30/19 1608 03/30/19 1618       Objective: Vitals: Today's Vitals   04/02/19 0523 04/02/19 0559 04/02/19 0800 04/02/19 0810  BP:  (!) 140/99  (!) 155/101  Pulse:  63  69  Resp:  17  20  Temp: 98.2 F (36.8 C)   (!) 97.1 F (36.2 C)  TempSrc: Oral   Axillary  SpO2:  97%  94%  Weight:      Height:      PainSc:   7      Intake/Output Summary (Last 24 hours) at 04/02/2019 0908 Last data filed at 04/02/2019 0846 Gross per 24 hour  Intake 785.5 ml  Output 1425 ml    Net -639.5 ml   Filed Weights   03/30/19 1513 03/31/19 0114 04/01/19 0500  Weight: (!) 140 kg (!) 142.6 kg (!) 142.9 kg   Weight change:    Intake/Output from previous day: 03/25 0701 - 03/26 0700 In: 238.5 [I.V.:138.4; IV Piggyback:100] Out: 3846 [Urine:1475] Intake/Output this shift: Total I/O In: 600 [P.O.:600] Out: 350 [Urine:350]  Examination: General exam: AA oriented,anxious,obese,on 2l [place].  HEENT:Oral mucosa moist, Ear/Nose  WNL grossly,dentition normal. Respiratory system: bilaterally clear,no wheezing or crackles,no use of accessory muscle, non tender. Cardiovascular system:S1 & S2 +,regular,No JVD. Gastrointestinal system:Abdomen soft, obese, tender in mid abdomen,ND,BS+. Nervous System:Alert, awake, moving extremities and grossly non-focal. Extremities:Rt knee tender- erythema on RLE impcovingt, chronic scaling + ON B/L LE mild edema, distal peripheral pulses palpable.  Skin:No rashes,no icterus. HWE:XHBZJI muscle bulk,tone,power.  Data Reviewed: I have personally reviewed following labs and imaging studies CBC: Recent Labs  Lab 03/30/19 1451 03/30/19 1451 03/30/19 1456 03/30/19 1824 03/31/19 0222 04/01/19 0305 04/02/19 0355  WBC 28.6*  --   --   --  33.8* 24.7* 17.6*  NEUTROABS 25.5*  --   --   --   --   --   --   HGB 15.2   < > 16.7  16.3 15.6 14.8 13.8 14.2  HCT 45.7   < > 49.0  48.0 46.0 42.5 41.5 44.2  MCV 91.4  --   --   --  88.9 92.6 94.6  PLT 454*  --   --   --  364 275 265   < > = values in this interval not displayed.   Basic Metabolic Panel: Recent Labs  Lab 03/30/19 1813 03/30/19 1824 03/30/19 2209 03/31/19 0222 03/31/19 0411 03/31/19 0411 03/31/19 1023 03/31/19 1604 03/31/19 2306 04/01/19 0305 04/02/19 0355  NA 130*   < >   < >  --  133*   < > 135 136 136 135 139  K 6.3*   < >   < >  --  5.5*   < > 5.6* 5.8* 5.2* 5.1 5.6*  CL 91*   < >   < >  --  98   < > 101 102 105 103 105  CO2 23   < >   < >  --  25   < > 22 21* '23 22 25   ' GLUCOSE 531*   < >   < >  --  235*   < > 203* 154* 195* 151* 96  BUN 96*   < >   < >  --  89*   < > 88* 81* 69* 66* 45*  CREATININE 3.18*   < >   < >  --  2.60*   < > 2.33* 2.06* 1.78* 1.64* 1.35*  CALCIUM 9.7   < >   < >  --  8.9   < > 8.4* 8.7* 8.6* 8.5* 8.9  MG 2.6*  --   --  2.4  --   --   --   --   --  2.3 2.2  PHOS 4.9*   < >  --  4.3 4.4  --   --  3.7  --  3.3 3.5   < > = values in this interval not displayed.   GFR: Estimated Creatinine Clearance: 85.6 mL/min (A) (by C-G formula based on SCr of 1.35 mg/dL (H)). Liver Function Tests: Recent Labs  Lab 03/30/19 1451 03/31/19 0411 03/31/19 1604 04/01/19 0305 04/02/19 0355  AST 40  --   --   --   --   ALT 37  --   --   --   --   ALKPHOS 224*  --   --   --   --   BILITOT 0.9  --   --   --   --   PROT 7.4  --   --   --   --   ALBUMIN 3.0*  2.5* 2.6* 2.3* 2.5*   Recent Labs  Lab 03/30/19 1451  LIPASE 40   Recent Labs  Lab 03/30/19 1512  AMMONIA 31   Coagulation Profile: No results for input(s): INR, PROTIME in the last 168 hours. Cardiac Enzymes: No results for input(s): CKTOTAL, CKMB, CKMBINDEX, TROPONINI in the last 168 hours. BNP (last 3 results) No results for input(s): PROBNP in the last 8760 hours. HbA1C: Recent Labs    03/30/19 1813  HGBA1C 12.0*   CBG: Recent Labs  Lab 04/01/19 1648 04/01/19 2032 04/01/19 2325 04/02/19 0415 04/02/19 0828  GLUCAP 244* 275* 121* 98 196*   Lipid Profile: Recent Labs    04/01/19 0305  CHOL 118  HDL 27*  LDLCALC 52  TRIG 193*  CHOLHDL 4.4   Thyroid Function Tests: No results for input(s): TSH, T4TOTAL, FREET4, T3FREE, THYROIDAB in the last 72 hours. Anemia Panel: No results for input(s): VITAMINB12, FOLATE, FERRITIN, TIBC, IRON, RETICCTPCT in the last 72 hours. Sepsis Labs: Recent Labs  Lab 03/30/19 1456 03/30/19 1813 03/31/19 0222 04/01/19 0305  PROCALCITON  --  0.46 0.53 0.41  LATICACIDVEN 3.4* 2.8*  --   --     Recent Results (from the past 240  hour(s))  Respiratory Panel by RT PCR (Flu A&B, Covid) - Nasopharyngeal Swab     Status: None   Collection Time: 03/30/19  2:39 PM   Specimen: Nasopharyngeal Swab  Result Value Ref Range Status   SARS Coronavirus 2 by RT PCR NEGATIVE NEGATIVE Final    Comment: (NOTE) SARS-CoV-2 target nucleic acids are NOT DETECTED. The SARS-CoV-2 RNA is generally detectable in upper respiratoy specimens during the acute phase of infection. The lowest concentration of SARS-CoV-2 viral copies this assay can detect is 131 copies/mL. A negative result does not preclude SARS-Cov-2 infection and should not be used as the sole basis for treatment or other patient management decisions. A negative result may occur with  improper specimen collection/handling, submission of specimen other than nasopharyngeal swab, presence of viral mutation(s) within the areas targeted by this assay, and inadequate number of viral copies (<131 copies/mL). A negative result must be combined with clinical observations, patient history, and epidemiological information. The expected result is Negative. Fact Sheet for Patients:  PinkCheek.be Fact Sheet for Healthcare Providers:  GravelBags.it This test is not yet ap proved or cleared by the [place] FDA and  has been authorized for detection and/or diagnosis of SARS-CoV-2 by FDA under an Emergency Use Authorization (EUA). This EUA will remain  in effect (meaning this test can be used) for the duration of the COVID-19 declaration under Section 564(b)(1) of the Act, 21 U.S.C. section 360bbb-3(b)(1), unless the authorization is terminated or revoked sooner.    Influenza A by PCR NEGATIVE NEGATIVE Final   Influenza B by PCR NEGATIVE NEGATIVE Final    Comment: (NOTE) The Xpert Xpress SARS-CoV-2/FLU/RSV assay is intended as an aid in  the diagnosis of influenza from Nasopharyngeal swab specimens and  should not be used as  a sole basis for treatment. Nasal washings and  aspirates are unacceptable for Xpert Xpress SARS-CoV-2/FLU/RSV  testing. Fact Sheet for Patients: PinkCheek.be Fact Sheet for Healthcare Providers: GravelBags.it This test is not yet approved or cleared by the [place] FDA and  has been authorized for detection and/or diagnosis of SARS-CoV-2 by  FDA under an Emergency Use Authorization (EUA). This EUA will remain  in effect (meaning this test can be used) for the duration of the  Covid-19 declaration under Section  564(b)(1) of the Act, 21  U.S.C. section 360bbb-3(b)(1), unless the authorization is  terminated or revoked. Performed at [facility], [place] [address] [place], [place] [address]   Culture, blood (routine x 2)     Status: None (Preliminary result)   Collection Time: 03/30/19  2:50 PM   Specimen: BLOOD  Result Value Ref Range Status   Specimen Description BLOOD RIGHT ANTECUBITAL  Final   Special Requests   Final    BOTTLES DRAWN AEROBIC AND ANAEROBIC Blood Culture adequate volume   Culture   Final    NO GROWTH 2 DAYS Performed at [facility], [place] [address], [place], [place] [address]    Report Status PENDING  Incomplete  Culture, blood (routine x 2)     Status: None (Preliminary result)   Collection Time: 03/30/19  4:47 PM   Specimen: BLOOD LEFT FOREARM  Result Value Ref Range Status   Specimen Description BLOOD LEFT FOREARM  Final   Special Requests   Final    BOTTLES DRAWN AEROBIC AND ANAEROBIC Blood Culture adequate volume   Culture   Final    NO GROWTH 2 DAYS Performed at [facility], [place] [address], [place], [place] [address]    Report Status PENDING  Incomplete  MRSA PCR Screening     Status: Abnormal   Collection Time: 03/30/19  4:54 PM   Specimen: Nasopharyngeal  Result Value Ref Range Status   MRSA by PCR POSITIVE (A) NEGATIVE Final    Comment:        The GeneXpert MRSA  Assay (FDA approved for NASAL specimens only), is one component of a comprehensive MRSA colonization surveillance program. It is not intended to diagnose MRSA infection nor to guide or monitor treatment for MRSA infections. RESULT CALLED TO, READ BACK BY AND VERIFIED WITH: R ROBERTS RN 03/30/19 1853 JDW Performed at [facility], [place] [address] [place], [place] [address]       Radiology Studies: ECHOCARDIOGRAM COMPLETE  Result Date: 03/31/2019    ECHOCARDIOGRAM REPORT   Patient Name:   CAINE BARFIELD Date of Exam: 03/31/2019 Medical Rec #:  [id number]    Height:       67.0 in Accession #:    [id number]   Weight:       314.4 lb Date of Birth:  30-Nov-1965     BSA:          2.451 m Patient Age:    7 years     BP:           101/74 mmHg Patient Gender: M            HR:           101 bpm. Exam Location:  Inpatient Procedure: 2D Echo, Cardiac Doppler and Color Doppler Indications:    Chest pain 786.50/R07.9  History:        Patient has prior history of Echocardiogram examinations, most                 recent 09/17/2013. CAD; Risk Factors:Hypertension. Acute MI.                 Acute renal failure. H/o CVA.  Sonographer:    Clayton Lefort RDCS (AE) Referring Phys: [id number] [facility] G BARRETT  Sonographer Comments: Suboptimal parasternal window, suboptimal apical window, suboptimal subcostal window and patient is morbidly obese. Image acquisition challenging due to patient body habitus. IMPRESSIONS  1. Small area of inferior basal  hypokinesis. Left ventricular ejection fraction, by estimation, is 55 to 60%. The left ventricle has normal function. Left ventricular endocardial border not optimally defined to evaluate regional wall motion. There is mild left ventricular hypertrophy. Left ventricular diastolic parameters were normal.  2. Right ventricular systolic function is normal. The right ventricular size is normal.  3. The mitral valve was not well visualized. No evidence of mitral valve regurgitation. No  evidence of mitral stenosis.  4. The aortic valve was not well visualized. Aortic valve regurgitation is not visualized. No aortic stenosis is present.  5. The inferior vena cava is normal in size with greater than 50% respiratory variability, suggesting right atrial pressure of 3 mmHg. FINDINGS  Left Ventricle: Small area of inferior basal hypokinesis. Left ventricular ejection fraction, by estimation, is 55 to 60%. The left ventricle has normal function. Left ventricular endocardial border not optimally defined to evaluate regional wall motion. The left ventricular internal cavity size was normal in size. There is mild left ventricular hypertrophy. Left ventricular diastolic parameters were normal. Right Ventricle: The right ventricular size is normal. No increase in right ventricular wall thickness. Right ventricular systolic function is normal. Left Atrium: Left atrial size was normal in size. Right Atrium: Right atrial size was normal in size. Pericardium: There is no evidence of pericardial effusion. Mitral Valve: The mitral valve was not well visualized. There is mild thickening of the mitral valve leaflet(s). There is mild calcification of the mitral valve leaflet(s). Normal mobility of the mitral valve leaflets. No evidence of mitral valve regurgitation. No evidence of mitral valve stenosis. Tricuspid Valve: The tricuspid valve is normal in structure. Tricuspid valve regurgitation is not demonstrated. No evidence of tricuspid stenosis. Aortic Valve: The aortic valve was not well visualized. Aortic valve regurgitation is not visualized. No aortic stenosis is present. Aortic valve mean gradient measures 3.0 mmHg. Aortic valve peak gradient measures 5.3 mmHg. Aortic valve area, by VTI measures 4.55 cm. Pulmonic Valve: The pulmonic valve was normal in structure. Pulmonic valve regurgitation is not visualized. No evidence of pulmonic stenosis. Aorta: The aortic root is normal in size and structure. Venous: The  inferior vena cava is normal in size with greater than 50% respiratory variability, suggesting right atrial pressure of 3 mmHg. IAS/Shunts: No atrial level shunt detected by color flow Doppler.  LEFT VENTRICLE PLAX 2D LVIDd:         3.70 cm  Diastology LVIDs:         2.70 cm  LV e' lateral:   4.57 cm/s LV PW:         1.50 cm  LV E/e' lateral: 13.9 LV IVS:        1.20 cm  LV e' medial:    5.22 cm/s LVOT diam:     2.30 cm  LV E/e' medial:  12.2 LV SV:         72 LV SV Index:   29 LVOT Area:     4.15 cm  RIGHT VENTRICLE             IVC RV Basal diam:  2.60 cm     IVC diam: 1.90 cm RV S prime:     17.60 cm/s TAPSE (M-mode): 1.9 cm LEFT ATRIUM             Index       RIGHT ATRIUM          Index LA diam:        1.60 cm 0.65 cm/m  RA Area:     9.70 cm LA Vol (A2C):   41.2 ml 16.81 ml/m RA Volume:   18.10 ml 7.39 ml/m LA Vol (A4C):   49.8 ml 20.32 ml/m LA Biplane Vol: 48.3 ml 19.71 ml/m  AORTIC VALVE AV Area (Vmax):    3.76 cm AV Area (Vmean):   3.68 cm AV Area (VTI):     4.55 cm AV Vmax:           115.00 cm/s AV Vmean:          88.900 cm/s AV VTI:            0.159 m AV Peak Grad:      5.3 mmHg AV Mean Grad:      3.0 mmHg LVOT Vmax:         104.00 cm/s LVOT Vmean:        78.700 cm/s LVOT VTI:          0.174 m LVOT/AV VTI ratio: 1.09  AORTA Ao Root diam: 3.20 cm MITRAL VALVE MV Area (PHT): 2.26 cm    SHUNTS MV Decel Time: 335 msec    Systemic VTI:  0.17 m MV E velocity: 63.50 cm/s  Systemic Diam: 2.30 cm MV A velocity: 79.50 cm/s MV E/A ratio:  0.80 Jenkins Rouge MD Electronically signed by Jenkins Rouge MD Signature Date/Time: 03/31/2019/2:11:49 PM    Final    VAS [place] LOWER EXTREMITY VENOUS (DVT)  Result Date: 03/31/2019  Lower Venous DVTStudy Indications: Edema, and SOB.  Limitations: Body habitus and poor ultrasound/tissue interface. Comparison Study: No prior exam. Performing Technologist: Baldwin Crown ARDMS, RVT  Examination Guidelines: A complete evaluation includes B-mode imaging, spectral Doppler,  color Doppler, and power Doppler as needed of all accessible portions of each vessel. Bilateral testing is considered an integral part of a complete examination. Limited examinations for reoccurring indications may be performed as noted. The reflux portion of the exam is performed with the patient in reverse Trendelenburg.  +---------+---------------+---------+-----------+----------+------------------+ RIGHT    CompressibilityPhasicitySpontaneityPropertiesThrombus Aging     +---------+---------------+---------+-----------+----------+------------------+ CFV      Full           Yes      Yes                                     +---------+---------------+---------+-----------+----------+------------------+ SFJ      Full                                                            +---------+---------------+---------+-----------+----------+------------------+ FV Prox  Full                                                            +---------+---------------+---------+-----------+----------+------------------+ FV Mid   Full                                                            +---------+---------------+---------+-----------+----------+------------------+  FV DistalFull                                                            +---------+---------------+---------+-----------+----------+------------------+ PFV      Full                                                            +---------+---------------+---------+-----------+----------+------------------+ POP      Full           Yes      Yes                                     +---------+---------------+---------+-----------+----------+------------------+ PTV      Full                                                            +---------+---------------+---------+-----------+----------+------------------+ PERO                                                  visualized with                                                           color              +---------+---------------+---------+-----------+----------+------------------+   +---------+---------------+---------+-----------+----------+------------------+ LEFT     CompressibilityPhasicitySpontaneityPropertiesThrombus Aging     +---------+---------------+---------+-----------+----------+------------------+ CFV      Full           Yes      Yes                                     +---------+---------------+---------+-----------+----------+------------------+ SFJ      Full                                                            +---------+---------------+---------+-----------+----------+------------------+ FV Prox  Full                                                            +---------+---------------+---------+-----------+----------+------------------+ FV Mid   Full                                                            +---------+---------------+---------+-----------+----------+------------------+  FV DistalFull                                                            +---------+---------------+---------+-----------+----------+------------------+ PFV      Full                                                            +---------+---------------+---------+-----------+----------+------------------+ POP      Full           Yes      Yes                                     +---------+---------------+---------+-----------+----------+------------------+ PTV      Full                                                            +---------+---------------+---------+-----------+----------+------------------+ PERO                                                  visualized with                                                          color              +---------+---------------+---------+-----------+----------+------------------+     Summary: RIGHT: - There is no evidence of deep  vein thrombosis in the lower extremity. However, portions of this examination were limited- see technologist comments above.  - No cystic structure found in the popliteal fossa. - Poorly visualized calf veins.  LEFT: - There is no evidence of deep vein thrombosis in the lower extremity. However, portions of this examination were limited- see technologist comments above.  - No cystic structure found in the popliteal fossa. - Poorly visualized calf veins.  *See table(s) above for measurements and observations. Electronically signed by Ruta Hinds MD on 03/31/2019 at 4:01:49 PM.    Final      LOS: 3 days   Time spent: More than 50% of that time was spent in counseling and/or coordination of care.  Antonieta Pert, MD [facility]  04/02/2019, 9:08 AM

## 2019-04-02 NOTE — Progress Notes (Signed)
eLink Physician-Brief Progress Note Patient Name: Jesus Cruz DOB: 04-Jan-1966 MRN: [id number]   Date of Service  04/02/2019  HPI/Events of Note  Nursing reports that patient pulled out HD catheter. Rounding team will need to establish need for replacement of catheter in AM.  eICU Interventions  Continue present management      Intervention Category Major Interventions: Other:  Jesus Cruz 04/02/2019, 5:23 AM

## 2019-04-02 NOTE — Progress Notes (Signed)
OT Cancellation Note  Patient Details Name: Jesus Cruz MRN: [id number] DOB: Aug 08, 1965   Cancelled Treatment:    Reason Eval/Treat Not Completed: Patient at procedure or test/ unavailable(CT)  Ramond Dial, OT/L   Acute OT Clinical Specialist Acute Rehabilitation Services Pager [phone] Office [phone]  04/02/2019, 2:27 PM

## 2019-04-02 NOTE — Progress Notes (Addendum)
ANTICOAGULATION CONSULT NOTE  Pharmacy Consult:  Heparin Indication: chest pain/ACS  Patient Measurements: Height: 5\' 7"  (170.2 cm) Weight: (!) 315 lb 0.6 oz (142.9 kg) IBW/kg (Calculated) : 66.1 Heparin Dosing Weight: 100kg  Vital Signs: Temp: 98.2 F (36.8 C) (03/25 2320) Temp Source: Oral (03/25 2320) BP: 173/96 (03/25 2320) Pulse Rate: 62 (03/25 2320)  Labs: Recent Labs    03/30/19 1451 03/30/19 1456 03/30/19 1813 03/30/19 1824 03/30/19 1942 03/30/19 2209 03/31/19 0222 03/31/19 0222 03/31/19 0308 03/31/19 0411 03/31/19 1604 03/31/19 2306 04/01/19 0305 04/01/19 1123 04/02/19 0355  HGB 15.2   < >  --    < >  --    < > 14.8   < >  --   --   --   --  13.8  --  14.2  HCT 45.7   < >  --    < >  --    < > 42.5  --   --   --   --   --  41.5  --  44.2  PLT 454*  --   --    < >  --   --  364  --   --   --   --   --  275  --  265  APTT  --   --   --   --   --   --   --   --  28  --   --   --   --   --   --   HEPARINUNFRC  --   --   --   --   --   --   --   --   --   --   --   --  <0.10* 0.29* 0.21*  CREATININE 3.82*   < > 3.18*  --   --    < >  --   --   --    < > 2.06* 1.78* 1.64*  --   --   TROPONINIHS 2,304*  --  4,413*  --  5,272*  --   --   --   --   --   --   --   --   --   --    < > = values in this interval not displayed.    Estimated Creatinine Clearance: 70.5 mL/min (A) (by C-G formula based on SCr of 1.64 mg/dL (H)).   Assessment: 26 YOM s/p Code STEMI with troponin trend up to 5200. The patient is s/p cath 3/24 which showed mid PDA occlusion for medical management.  Pharmacy consulted to manage IV heparin post cath.  Heparin level this morning is slightly SUBtherapeutic (HL 0.21, goal of 0.3-0.7). Will increase and add stop date/time for noon today per cardiology note - no additional levels. No bleeding or issues noted per RN.   Addendum: RN called back to report bleeding from pulled out HD cath. RN discussed with CCM - to continue Heparin drip for now.    Goal of Therapy:  Heparin level 0.3-0.7 units/ml Monitor platelets by anticoagulation protocol: Yes   Plan:  Increase heparin gtt to 2000 units/hr.  Stop heparin 3/26 around 12N per Cards. Will hold off on any additional levels  Thank you for allowing pharmacy to be a part of this patient's care.  Alycia Rossetti, PharmD, BCPS Clinical Pharmacist 04/02/2019 5:11 AM   **Pharmacist phone directory can now be found on [email or web address] ([email or web address]).  Listed under [place].

## 2019-04-02 NOTE — Progress Notes (Signed)
Physical Therapy Treatment Patient Details Name: Jesus Cruz MRN: [id number] DOB: 1965/06/07 Today's Date: 04/02/2019    History of Present Illness 54 year old male with past medical history significant for CAD, hypertension, and prior CVA, admitted with complaints of chest pain as a code STEMI found to have hyperkalemia, acute renal failure, and DKA. Pt uinderwent LHC on 3/24 with recommendation for medical management.    PT Comments    Pt progressing with tx, still needing some assist with cues and set up, also SBA-min guard assist with bed mob and transfers. Sitting tolerance and balance also much improved from last session. Pt states he was using w/c for mobility at home and has some weakness in BLE at baseline. Will continue to follow pt while in hospital to address BLE strength, balance and coordination, activity tolerance, independence and safety with all mobility.      Follow Up Recommendations  Home health PT;Supervision for mobility/OOB     Equipment Recommendations  None recommended by PT    Recommendations for Other Services       Precautions / Restrictions Precautions Precautions: Fall Restrictions Weight Bearing Restrictions: No    Mobility  Bed Mobility Overal bed mobility: Needs Assistance Bed Mobility: Supine to Sit     Supine to sit: Supervision(set up)        Transfers Overall transfer level: Needs assistance Equipment used: Rolling walker (2 wheeled) Transfers: Sit to/from [facility] Sit to Stand: Min guard;Supervision Stand pivot transfers: Min guard;Supervision       General transfer comment: still needing cues for safety and also some sequencing  Ambulation/Gait             General Gait Details: only able to take small pivotal steps from bed to recliner   Stairs             Wheelchair Mobility    Modified Rankin (Stroke Patients Only)       Balance Overall balance assessment: Needs  assistance Sitting-balance support: Feet supported;Single extremity supported Sitting balance-Leahy Scale: Good Sitting balance - Comments: supervision   Standing balance support: During functional activity;Bilateral upper extremity supported Standing balance-Leahy Scale: Fair                              Cognition Arousal/Alertness: Awake/alert Behavior During Therapy: WFL for tasks assessed/performed Overall Cognitive Status: Within Functional Limits for tasks assessed                                        Exercises      General Comments General comments (skin integrity, edema, etc.): initially on room air but poor reading on monitor, on 2.5L/min sats in low 90s      Pertinent Vitals/Pain Pain Assessment: Faces Faces Pain Scale: Hurts even more Pain Location: w/ mobility, WB Pain Intervention(s): Limited activity within patient's tolerance;Monitored during session    Home Living                      Prior Function            PT Goals (current goals can now be found in the care plan section) Acute Rehab PT Goals Patient Stated Goal: To improve mobility and get healthy to take care of mother PT Goal Formulation: With patient Time For Goal Achievement: 04/15/19 Potential to Achieve Goals: Good  Progress towards PT goals: Progressing toward goals    Frequency    Min 3X/week      PT Plan Current plan remains appropriate    Co-evaluation              AM-PAC PT "6 Clicks" Mobility   Outcome Measure  Help needed turning from your back to your side while in a flat bed without using bedrails?: None Help needed moving from lying on your back to sitting on the side of a flat bed without using bedrails?: None Help needed moving to and from a bed to a chair (including a wheelchair)?: A Little Help needed standing up from a chair using your arms (e.g., wheelchair or bedside chair)?: A Little Help needed to walk in hospital  room?: A Lot Help needed climbing 3-5 steps with a railing? : Total 6 Click Score: 17    End of Session Equipment Utilized During Treatment: Oxygen Activity Tolerance: Patient limited by fatigue;Patient tolerated treatment well Patient left: in chair;with call bell/phone within reach;with chair alarm set Nurse Communication: Mobility status PT Visit Diagnosis: Other abnormalities of gait and mobility (R26.89);Muscle weakness (generalized) (M62.81);Pain Pain - Right/Left: Right Pain - part of body: Knee     Time: 1305-1330 PT Time Calculation (min) (ACUTE ONLY): 25 min  Charges:  $Therapeutic Activity: 23-37 mins                     Horald Chestnut, PT    Delford Field 04/02/2019, 2:25 PM

## 2019-04-02 NOTE — Progress Notes (Signed)
Patient called out with complaint of "not feeling well and I feel like I'm going to throw up." I immediately gave patient Zofran. He stated that this happens  Frequently after meals. He did eat pancakes, oatmeal, orange juice and milk for breakfast. He stated he feels like his "ulcers are acting up." I paged Dr. Maren Beach and he came to bedside

## 2019-04-02 NOTE — Progress Notes (Signed)
Iodine applied to right great toe and 4th toe as ordered

## 2019-04-02 NOTE — Discharge Instructions (Signed)
[facility]ocrin[facility] [phone]) 1. Dr. Philemon Kingdom 2. Dr. Janie [facility] [phone]) 1. Dr. Delrae Rend [facility] [phone]) 1. Dr. Jacelyn Pi 2. Dr. Anda Kraft [facility] [phone]) 1. Dr. Daneil Dolin [facility] [phone]) [[place] office]  [phone]) [[place] office] 1. Dr. Lenna Sciara Solum 2. Dr. Mee Hives [facility]) [phone]) 1. Autumn Hudnall Ronnald Ramp), PA 2. Dr. Amalia Greenhouse 3. Dr. Marsh Dolly. [facility] [phone]) 1. Dr. Glade Lloyd [facility] [phone]) 1. Dr. Orville Govern 2. Dr. Lelon Huh 3. Dr. Jerelene Redden 4. Alwyn Ren, FNP Dr. Carolynn Serve. [facility] [place] [phone])  Hypoglycemia Hypoglycemia occurs when the level of sugar (glucose) in the blood is too low. Hypoglycemia can happen in people who do or do not have diabetes. It can develop quickly, and it can be a medical emergency. For most people with diabetes, a blood glucose level below 70 mg/dL (3.9 mmol/L) is considered hypoglycemia. Glucose is a type of sugar that provides the body's main source of energy. Certain hormones (insulin and glucagon) control the level of glucose in the blood. Insulin lowers blood glucose, and glucagon raises blood glucose. Hypoglycemia can result from having too much insulin in the bloodstream, or from not eating enough food that contains glucose. You may also have reactive hypoglycemia, which happens within 4 hours after eating a meal. What are the causes? Hypoglycemia occurs most often in people who have diabetes and may be caused by:  Diabetes medicine.  Not eating enough, or not eating often enough.  Increased physical activity.  Drinking alcohol on an empty stomach. If you do not have diabetes, hypoglycemia may be  caused by:  A tumor in the pancreas.  Not eating enough, or not eating for long periods at a time (fasting).  A severe infection or illness.  Certain medicines. What increases the risk? Hypoglycemia is more likely to develop in:  People who have diabetes and take medicines to lower blood glucose.  People who abuse alcohol.  People who have a severe illness. What are the signs or symptoms? Mild symptoms Mild hypoglycemia may not cause any symptoms. If you do have symptoms, they may include:  Hunger.  Anxiety.  Sweating and feeling clammy.  Dizziness or feeling light-headed.  Sleepiness.  Nausea.  Increased heart rate.  Headache.  Blurry vision.  Irritability.  Tingling or numbness around the mouth, lips, or tongue.  A change in coordination.  Restless sleep. Moderate symptoms Moderate hypoglycemia can cause:  Mental confusion and poor judgment.  Behavior changes.  Weakness.  Irregular heartbeat. Severe symptoms Severe hypoglycemia is a medical emergency. It can cause:  Fainting.  Seizures.  Loss of consciousness (coma).  Death. How is this diagnosed? Hypoglycemia is diagnosed with a blood test to measure your blood glucose level. This blood test is done while you are having symptoms. Your health care provider may also do a physical exam and review your medical history. How is this treated? This condition can often be treated by immediately eating or drinking something that contains sugar, such as:  Fruit juice, 4-6 oz (120-150 mL).  Regular soda (not diet soda), 4-6 oz (120-150 mL).  Low-fat milk, 4 oz (120 mL).  Several pieces of hard candy.  Sugar or honey, 1 Tbsp (15 mL). Treating hypoglycemia if you have diabetes If you are alert and able to swallow safely, follow the 15:15 rule:  Take 15 grams of  a rapid-acting carbohydrate. Talk with your health care provider about how much you should take.  Rapid-acting options  include: ? Glucose pills (take 15 grams). ? 6-8 pieces of hard candy. ? 4-6 oz (120-150 mL) of fruit juice. ? 4-6 oz (120-150 mL) of regular (not diet) soda. ? 1 Tbsp (15 mL) honey or sugar.  Check your blood glucose 15 minutes after you take the carbohydrate.  If the repeat blood glucose level is still at or below 70 mg/dL (3.9 mmol/L), take 15 grams of a carbohydrate again.  If your blood glucose level does not increase above 70 mg/dL (3.9 mmol/L) after 3 tries, seek emergency medical care.  After your blood glucose level returns to normal, eat a meal or a snack within 1 hour.  Treating severe hypoglycemia Severe hypoglycemia is when your blood glucose level is at or below 54 mg/dL (3 mmol/L). Severe hypoglycemia is a medical emergency. Get medical help right away. If you have severe hypoglycemia and you cannot eat or drink, you may need an injection of glucagon. A family member or close friend should learn how to check your blood glucose and how to give you a glucagon injection. Ask your health care provider if you need to have an emergency glucagon injection kit available. Severe hypoglycemia may need to be treated in a hospital. The treatment may include getting glucose through an IV. You may also need treatment for the cause of your hypoglycemia. Follow these instructions at home:  General instructions  Take over-the-counter and prescription medicines only as told by your health care provider.  Monitor your blood glucose as told by your health care provider.  Limit alcohol intake to no more than 1 drink a day for nonpregnant women and 2 drinks a day for men. One drink equals 12 oz of beer (355 mL), 5 oz of wine (148 mL), or 1 oz of hard liquor (44 mL).  Keep all follow-up visits as told by your health care provider. This is important. If you have diabetes:  Always have a rapid-acting carbohydrate snack with you to treat low blood glucose.  Follow your diabetes management plan  as directed. Make sure you: ? Know the symptoms of hypoglycemia. It is important to treat it right away to prevent it from becoming severe. ? Take your medicines as directed. ? Follow your exercise plan. ? Follow your meal plan. Eat on time, and do not skip meals. ? Check your blood glucose as often as directed. Always check before and after exercise. ? Follow your sick day plan whenever you cannot eat or drink normally. Make this plan in advance with your health care provider.  Share your diabetes management plan with people in your workplace, school, and household.  Check your urine for ketones when you are ill and as told by your health care provider.  Carry a medical alert card or wear medical alert jewelry. Contact a health care provider if:  You have problems keeping your blood glucose in your target range.  You have frequent episodes of hypoglycemia. Get help right away if:  You continue to have hypoglycemia symptoms after eating or drinking something containing glucose.  Your blood glucose is at or below 54 mg/dL (3 mmol/L).  You have a seizure.  You faint. These symptoms may represent a serious problem that is an emergency. Do not wait to see if the symptoms will go away. Get medical help right away. Call your local emergency services (911 in the [place] Summary  Hypoglycemia occurs when the level of sugar (glucose) in the blood is too low.  Hypoglycemia can happen in people who do or do not have diabetes. It can develop quickly, and it can be a medical emergency.  Make sure you know the symptoms of hypoglycemia and how to treat it.  Always have a rapid-acting carbohydrate snack with you to treat low blood sugar. This information is not intended to replace advice given to you by your health care provider. Make sure you discuss any questions you have with your health care provider. Document Revised: 06/16/2017 Document Reviewed: 01/27/2015 Elsevier Patient Education   [place]. Hyperglycemia Hyperglycemia occurs when the level of sugar (glucose) in the blood is too high. Glucose is a type of sugar that provides the body's main source of energy. Certain hormones (insulin and glucagon) control the level of glucose in the blood. Insulin lowers blood glucose, and glucagon increases blood glucose. Hyperglycemia can result from having too little insulin in the bloodstream, or from the body not responding normally to insulin. Hyperglycemia occurs most often in people who have diabetes (diabetes mellitus), but it can happen in people who do not have diabetes. It can develop quickly, and it can be life-threatening if it causes you to become severely dehydrated (diabetic ketoacidosis or hyperglycemic hyperosmolar state). Severe hyperglycemia is a medical emergency. What are the causes? If you have diabetes, hyperglycemia may be caused by:  Diabetes medicine.  Medicines that increase blood glucose or affect your diabetes control.  Not eating enough, or not eating often enough.  Changes in physical activity level.  Being sick or having an infection. If you have prediabetes or undiagnosed diabetes:  Hyperglycemia may be caused by those conditions. If you do not have diabetes, hyperglycemia may be caused by:  Certain medicines, including steroid medicines, beta-blockers, epinephrine, and thiazide diuretics.  Stress.  Serious illness.  Surgery.  Diseases of the pancreas.  Infection. What increases the risk? Hyperglycemia is more likely to develop in people who have risk factors for diabetes, such as:  Having a family member with diabetes.  Having a gene for type 1 diabetes that is passed from parent to child (inherited).  Living in an area with cold weather conditions.  Exposure to certain viruses.  Certain conditions in which the body's disease-fighting (immune) system attacks itself (autoimmune disorders).  Being overweight or  obese.  Having an inactive (sedentary) lifestyle.  Having been diagnosed with insulin resistance.  Having a history of prediabetes, gestational diabetes, or polycystic ovarian syndrome (PCOS).  Being of American-Indian, African-American, Hispanic/Latino, or Asian/Pacific Islander descent. What are the signs or symptoms? Hyperglycemia may not cause any symptoms. If you do have symptoms, they may include early warning signs, such as:  Increased thirst.  Hunger.  Feeling very tired.  Needing to urinate more often than usual.  Blurry vision. Other symptoms may develop if hyperglycemia gets worse, such as:  Dry mouth.  Loss of appetite.  Fruity-smelling breath.  Weakness.  Unexpected or rapid weight gain or weight loss.  Tingling or numbness in the hands or feet.  Headache.  Skin that does not quickly return to normal after being lightly pinched and released (poor skin turgor).  Abdominal pain.  Cuts or bruises that are slow to heal. How is this diagnosed? Hyperglycemia is diagnosed with a blood test to measure your blood glucose level. This blood test is usually done while you are having symptoms. Your health care provider may also do a physical exam and  review your medical history. You may have more tests to determine the cause of your hyperglycemia, such as:  A fasting blood glucose (FBG) test. You will not be allowed to eat (you will fast) for at least 8 hours before a blood sample is taken.  An A1c (hemoglobin A1c) blood test. This provides information about blood glucose control over the previous 2-3 months.  An oral glucose tolerance test (OGTT). This measures your blood glucose at two times: ? After fasting. This is your baseline blood glucose level. ? Two hours after drinking a beverage that contains glucose. How is this treated? Treatment depends on the cause of your hyperglycemia. Treatment may include:  Taking medicine to regulate your blood glucose  levels. If you take insulin or other diabetes medicines, your medicine or dosage may be adjusted.  Lifestyle changes, such as exercising more, eating healthier foods, or losing weight.  Treating an illness or infection, if this caused your hyperglycemia.  Checking your blood glucose more often.  Stopping or reducing steroid medicines, if these caused your hyperglycemia. If your hyperglycemia becomes severe and it results in hyperglycemic hyperosmolar state, you must be hospitalized and given IV fluids. Follow these instructions at home:  General instructions  Take over-the-counter and prescription medicines only as told by your health care provider.  Do not use any products that contain nicotine or tobacco, such as cigarettes and e-cigarettes. If you need help quitting, ask your health care provider.  Limit alcohol intake to no more than 1 drink per day for nonpregnant women and 2 drinks per day for men. One drink equals 12 oz of beer, 5 oz of wine, or 1 oz of hard liquor.  Learn to manage stress. If you need help with this, ask your health care provider.  Keep all follow-up visits as told by your health care provider. This is important. Eating and drinking   Maintain a healthy weight.  Exercise regularly, as directed by your health care provider.  Stay hydrated, especially when you exercise, get sick, or spend time in hot temperatures.  Eat healthy foods, such as: ? Lean proteins. ? Complex carbohydrates. ? Fresh fruits and vegetables. ? Low-fat dairy products. ? Healthy fats.  Drink enough fluid to keep your urine clear or pale yellow. If you have diabetes:  Make sure you know the symptoms of hyperglycemia.  Follow your diabetes management plan, as told by your health care provider. Make sure you: ? Take your insulin and medicines as directed. ? Follow your exercise plan. ? Follow your meal plan. Eat on time, and do not skip meals. ? Check your blood glucose as  often as directed. Make sure to check your blood glucose before and after exercise. If you exercise longer or in a different way than usual, check your blood glucose more often. ? Follow your sick day plan whenever you cannot eat or drink normally. Make this plan in advance with your health care provider.  Share your diabetes management plan with people in your workplace, school, and household.  Check your urine for ketones when you are ill and as told by your health care provider.  Carry a medical alert card or wear medical alert jewelry. Contact a health care provider if:  Your blood glucose is at or above 240 mg/dL (13.3 mmol/L) for 2 days in a row.  You have problems keeping your blood glucose in your target range.  You have frequent episodes of hyperglycemia. Get help right away if:  You have  difficulty breathing.  You have a change in how you think, feel, or act (mental status).  You have nausea or vomiting that does not go away. These symptoms may represent a serious problem that is an emergency. Do not wait to see if the symptoms will go away. Get medical help right away. Call your local emergency services (911 in the [place] Do not drive yourself to the hospital. Summary  Hyperglycemia occurs when the level of sugar (glucose) in the blood is too high.  Hyperglycemia is diagnosed with a blood test to measure your blood glucose level. This blood test is usually done while you are having symptoms. Your health care provider may also do a physical exam and review your medical history.  If you have diabetes, follow your diabetes management plan as told by your health care provider.  Contact your health care provider if you have problems keeping your blood glucose in your target range. This information is not intended to replace advice given to you by your health care provider. Make sure you discuss any questions you have with your health care provider. Document Revised: 09/11/2015  Document Reviewed: 09/11/2015 Elsevier Patient Education  [place]. Hemoglobin A1c Test Why am I having this test? You may have the hemoglobin A1c test (HbA1c test) done to:  Evaluate your risk for developing diabetes (diabetes mellitus).  Diagnose diabetes.  Monitor long-term control of blood sugar (glucose) in people who have diabetes and help make treatment decisions. This test may be done with other blood glucose tests, such as fasting blood glucose and oral glucose tolerance tests. What is being tested? Hemoglobin is a type of protein in the blood that carries oxygen. Glucose attaches to hemoglobin to form glycated hemoglobin. This test checks the amount of glycated hemoglobin in your blood, which is a good indicator of the average amount of glucose in your blood during the past 2-3 months. What kind of sample is taken?  A blood sample is required for this test. It is usually collected by inserting a needle into a blood vessel. Tell a health care provider about:  All medicines you are taking, including vitamins, herbs, eye drops, creams, and over-the-counter medicines.  Any blood disorders you have.  Any surgeries you have had.  Any medical conditions you have.  Whether you are pregnant or may be pregnant. How are the results reported? Your results will be reported as a percentage that indicates how much of your hemoglobin has glucose attached to it (is glycated). Your health care provider will compare your results to normal ranges that were established after testing a large group of people (reference ranges). Reference ranges may vary among labs and hospitals. For this test, common reference ranges are:  Adult or child without diabetes: 4-5.6%.  Adult or child with diabetes and good blood glucose control: less than 7%. What do the results mean? If you have diabetes:  A result of less than 7% is considered normal, meaning that your blood glucose is well  controlled.  A result higher than 7% means that your blood glucose is not well controlled, and your treatment plan may need to be adjusted. If you do not have diabetes:  A result within the reference range is considered normal, meaning that you are not at high risk for diabetes.  A result of 5.7-6.4% means that you have a high risk of developing diabetes, and you may have prediabetes. Prediabetes is the condition of having a blood glucose level that is higher  than it should be, but not high enough for you to be diagnosed with diabetes. Having prediabetes puts you at risk for developing type 2 diabetes (type 2 diabetes mellitus). You may have more tests, including a repeat HbA1c test.  Results of 6.5% or higher on two separate HbA1c tests mean that you have diabetes. You may have more tests to confirm the diagnosis. Abnormally low HbA1c values may be caused by:  Pregnancy.  Severe blood loss.  Receiving donated blood (transfusions).  Low red blood cell count (anemia).  Long-term kidney failure.  Some unusual forms (variants) of hemoglobin. Talk with your health care provider about what your results mean. Questions to ask your health care provider Ask your health care provider, or the department that is doing the test:  When will my results be ready?  How will I get my results?  What are my treatment options?  What other tests do I need?  What are my next steps? Summary  The hemoglobin A1c test (HbA1c test) may be done to evaluate your risk for developing diabetes, to diagnose diabetes, and to monitor long-term control of blood sugar (glucose) in people who have diabetes and help make treatment decisions.  Hemoglobin is a type of protein in the blood that carries oxygen. Glucose attaches to hemoglobin to form glycated hemoglobin. This test checks the amount of glycated hemoglobin in your blood, which is a good indicator of the average amount of glucose in your blood during the  past 2-3 months.  Talk with your health care provider about what your results mean. This information is not intended to replace advice given to you by your health care provider. Make sure you discuss any questions you have with your health care provider. Document Revised: 12/06/2016 Document Reviewed: 08/06/2016 Elsevier Patient Education  [place] about your medication: Plavix (anti-platelet agent)  Generic Name (Brand): clopidogrel (Plavix), once daily medication  PURPOSE: You are taking this medication along with aspirin to lower your chance of having a heart attack, stroke, or blood clots in your heart stent. These can be fatal. Plavix and aspirin help prevent platelets from sticking together and forming a clot that can block an artery or your stent.   Common SIDE EFFECTS you may experience include: bruising or bleeding more easily, shortness of breath  Do not stop taking PLAVIX without talking to the doctor who prescribes it for you. People who are treated with a stent and stop taking Plavix too soon, have a higher risk of getting a blood clot in the stent, having a heart attack, or dying. If you stop Plavix because of bleeding, or for other reasons, your risk of a heart attack or stroke may increase.   Tell all of your doctors and dentists that you are taking Plavix. They should talk to the doctor who prescribed plavix for you before you have any surgery or invasive procedure.   Contact your health care provider if you experience: severe or uncontrollable bleeding, pink/red/brown urine, vomiting blood or vomit that looks like "coffee grounds", red or black stools (looks like tar), coughing up blood or blood clots ----------------------------------------------------------------------------------------------------------------------

## 2019-04-02 NOTE — Progress Notes (Signed)
  RD consulted for nutrition education regarding diabetes.   Lab Results  Component Value Date   HGBA1C 12.0 (H) 03/30/2019   Pt not in room at time of RD visit. Left handout "Carbohydrate Counting for People with Diabetes" handout from the Academy of [employer] and Dietetics at bedside. Recommend further diet education in outpatient setting. Will attempt to reach pt again as time permits.   Current diet order is heart healthy, patient is consuming approximately 75% of meals at this time. Labs and medications reviewed. No further nutrition interventions warranted at this time. RD contact information provided. If additional nutrition issues arise, please re-consult RD.  Mariana Single RD, LDN Clinical Nutrition Pager listed in [place]

## 2019-04-02 NOTE — Progress Notes (Signed)
Progress Note  Patient Name: Domonik Minerd Date of Encounter: 04/02/2019  Primary Cardiologist: Daneen Schick  Subjective   Nausea this am.   Inpatient Medications    Scheduled Meds: . alum & mag hydroxide-simeth  30 mL Oral STAT  . aspirin EC  81 mg Oral Daily  . Chlorhexidine Gluconate Cloth  6 each Topical Q0600  . clopidogrel  75 mg Oral Daily  . insulin aspart  0-20 Units Subcutaneous Q4H  . insulin detemir  10 Units Subcutaneous BID  . metoCLOPramide (REGLAN) injection  10 mg Intravenous Once  . metoprolol succinate  150 mg Oral Daily  . mupirocin ointment  1 application Nasal BID  . pantoprazole (PROTONIX) IV  40 mg Intravenous Q24H  . sodium chloride flush  3 mL Intravenous Q12H  . sodium chloride flush  3 mL Intravenous Q12H   Continuous Infusions: . sodium chloride    . heparin 2,000 Units/hr (04/02/19 0556)   PRN Meds: sodium chloride, acetaminophen, morphine injection, ondansetron (ZOFRAN) IV, phenol, sodium chloride flush, sodium chloride flush   Vital Signs    Vitals:   04/01/19 2320 04/02/19 0523 04/02/19 0559 04/02/19 0810  BP: (!) 173/96  (!) 140/99 (!) 155/101  Pulse: 62  63 69  Resp: 16  17 20   Temp: 98.2 F (36.8 C) 98.2 F (36.8 C)  (!) 97.1 F (36.2 C)  TempSrc: Oral Oral  Axillary  SpO2: 97%  97% 94%  Weight:      Height:        Intake/Output Summary (Last 24 hours) at 04/02/2019 0935 Last data filed at 04/02/2019 0846 Gross per 24 hour  Intake 785.5 ml  Output 1425 ml  Net -639.5 ml   Last 3 Weights 04/01/2019 03/31/2019 03/30/2019  Weight (lbs) 315 lb 0.6 oz 314 lb 6 oz 308 lb 10.3 oz  Weight (kg) 142.9 kg 142.6 kg 140 kg      Telemetry    sinus - Personally Reviewed  ECG    Sinus, subtle inferior ST elevation with Q waves - Personally Reviewed  Physical Exam    General: Well developed, well nourished, NAD  HEENT: OP clear, mucus membranes moist  SKIN: warm, dry. No rashes. Neuro: No focal deficits  Musculoskeletal:  Muscle strength 5/5 all ext  Psychiatric: Mood and affect normal  Neck: No JVD, no carotid bruits, no thyromegaly, no lymphadenopathy.  Lungs:Clear bilaterally, no wheezes, rhonci, crackles Cardiovascular: Regular rate and rhythm. No murmurs, gallops or rubs. Abdomen:Soft. Bowel sounds present. Non-tender.  Extremities: No lower extremity edema. Pulses are 2 + in the bilateral DP/PT.   Labs    High Sensitivity Troponin:   Recent Labs  Lab 03/30/19 1451 03/30/19 1813 03/30/19 1942  TROPONINIHS 2,304* 4,413* 5,272*      Chemistry Recent Labs  Lab 03/30/19 1451 03/30/19 1456 03/31/19 1604 03/31/19 1604 03/31/19 2306 04/01/19 0305 04/02/19 0355  NA 120*   < > 136   < > 136 135 139  K >7.5*   < > 5.8*   < > 5.2* 5.1 5.6*  CL 83*   < > 102   < > 105 103 105  CO2 19*   < > 21*   < > 23 22 25   GLUCOSE 857*   < > 154*   < > 195* 151* 96  BUN 101*   < > 81*   < > 69* 66* 45*  CREATININE 3.82*   < > 2.06*   < > 1.78* 1.64* 1.35*  CALCIUM 8.6*   < > 8.7*   < > 8.6* 8.5* 8.9  PROT 7.4  --   --   --   --   --   --   ALBUMIN 3.0*   < > 2.6*  --   --  2.3* 2.5*  AST 40  --   --   --   --   --   --   ALT 37  --   --   --   --   --   --   ALKPHOS 224*  --   --   --   --   --   --   BILITOT 0.9  --   --   --   --   --   --   GFRNONAA 17*   < > 35*   < > 42* 47* 59*  GFRAA 19*   < > 41*   < > 49* 54* >60  ANIONGAP 18*   < > 13   < > 8 10 9    < > = values in this interval not displayed.     Hematology Recent Labs  Lab 03/31/19 0222 04/01/19 0305 04/02/19 0355  WBC 33.8* 24.7* 17.6*  RBC 4.78 4.48 4.67  HGB 14.8 13.8 14.2  HCT 42.5 41.5 44.2  MCV 88.9 92.6 94.6  MCH 31.0 30.8 30.4  MCHC 34.8 33.3 32.1  RDW 12.5 12.8 12.7  PLT 364 275 265    BNP Recent Labs  Lab 03/30/19 1451 03/30/19 1813  BNP 88.7 549.1*     DDimer No results for input(s): DDIMER in the last 168 hours.   Radiology    ECHOCARDIOGRAM COMPLETE  Result Date: 03/31/2019    ECHOCARDIOGRAM REPORT    Patient Name:   IZEAH BREUNINGER Date of Exam: 03/31/2019 Medical Rec #:  [id number]    Height:       67.0 in Accession #:    [id number]   Weight:       314.4 lb Date of Birth:  Dec 15, 1965     BSA:          2.451 m Patient Age:    54 years     BP:           101/74 mmHg Patient Gender: M            HR:           101 bpm. Exam Location:  Inpatient Procedure: 2D Echo, Cardiac Doppler and Color Doppler Indications:    Chest pain 786.50/R07.9  History:        Patient has prior history of Echocardiogram examinations, most                 recent 09/17/2013. CAD; Risk Factors:Hypertension. Acute MI.                 Acute renal failure. H/o CVA.  Sonographer:    Clayton Lefort RDCS (AE) Referring Phys: 1993 [facility] G BARRETT  Sonographer Comments: Suboptimal parasternal window, suboptimal apical window, suboptimal subcostal window and patient is morbidly obese. Image acquisition challenging due to patient body habitus. IMPRESSIONS  1. Small area of inferior basal hypokinesis. Left ventricular ejection fraction, by estimation, is 55 to 60%. The left ventricle has normal function. Left ventricular endocardial border not optimally defined to evaluate regional wall motion. There is mild left ventricular hypertrophy. Left ventricular diastolic parameters were normal.  2. Right ventricular systolic function is normal. The right ventricular size is  normal.  3. The mitral valve was not well visualized. No evidence of mitral valve regurgitation. No evidence of mitral stenosis.  4. The aortic valve was not well visualized. Aortic valve regurgitation is not visualized. No aortic stenosis is present.  5. The inferior vena cava is normal in size with greater than 50% respiratory variability, suggesting right atrial pressure of 3 mmHg. FINDINGS  Left Ventricle: Small area of inferior basal hypokinesis. Left ventricular ejection fraction, by estimation, is 55 to 60%. The left ventricle has normal function. Left ventricular endocardial border not  optimally defined to evaluate regional wall motion. The left ventricular internal cavity size was normal in size. There is mild left ventricular hypertrophy. Left ventricular diastolic parameters were normal. Right Ventricle: The right ventricular size is normal. No increase in right ventricular wall thickness. Right ventricular systolic function is normal. Left Atrium: Left atrial size was normal in size. Right Atrium: Right atrial size was normal in size. Pericardium: There is no evidence of pericardial effusion. Mitral Valve: The mitral valve was not well visualized. There is mild thickening of the mitral valve leaflet(s). There is mild calcification of the mitral valve leaflet(s). Normal mobility of the mitral valve leaflets. No evidence of mitral valve regurgitation. No evidence of mitral valve stenosis. Tricuspid Valve: The tricuspid valve is normal in structure. Tricuspid valve regurgitation is not demonstrated. No evidence of tricuspid stenosis. Aortic Valve: The aortic valve was not well visualized. Aortic valve regurgitation is not visualized. No aortic stenosis is present. Aortic valve mean gradient measures 3.0 mmHg. Aortic valve peak gradient measures 5.3 mmHg. Aortic valve area, by VTI measures 4.55 cm. Pulmonic Valve: The pulmonic valve was normal in structure. Pulmonic valve regurgitation is not visualized. No evidence of pulmonic stenosis. Aorta: The aortic root is normal in size and structure. Venous: The inferior vena cava is normal in size with greater than 50% respiratory variability, suggesting right atrial pressure of 3 mmHg. IAS/Shunts: No atrial level shunt detected by color flow Doppler.  LEFT VENTRICLE PLAX 2D LVIDd:         3.70 cm  Diastology LVIDs:         2.70 cm  LV e' lateral:   4.57 cm/s LV PW:         1.50 cm  LV E/e' lateral: 13.9 LV IVS:        1.20 cm  LV e' medial:    5.22 cm/s LVOT diam:     2.30 cm  LV E/e' medial:  12.2 LV SV:         72 LV SV Index:   29 LVOT Area:      4.15 cm  RIGHT VENTRICLE             IVC RV Basal diam:  2.60 cm     IVC diam: 1.90 cm RV S prime:     17.60 cm/s TAPSE (M-mode): 1.9 cm LEFT ATRIUM             Index       RIGHT ATRIUM          Index LA diam:        1.60 cm 0.65 cm/m  RA Area:     9.70 cm LA Vol (A2C):   41.2 ml 16.81 ml/m RA Volume:   18.10 ml 7.39 ml/m LA Vol (A4C):   49.8 ml 20.32 ml/m LA Biplane Vol: 48.3 ml 19.71 ml/m  AORTIC VALVE AV Area (Vmax):    3.76 cm AV Area (  Vmean):   3.68 cm AV Area (VTI):     4.55 cm AV Vmax:           115.00 cm/s AV Vmean:          88.900 cm/s AV VTI:            0.159 m AV Peak Grad:      5.3 mmHg AV Mean Grad:      3.0 mmHg LVOT Vmax:         104.00 cm/s LVOT Vmean:        78.700 cm/s LVOT VTI:          0.174 m LVOT/AV VTI ratio: 1.09  AORTA Ao Root diam: 3.20 cm MITRAL VALVE MV Area (PHT): 2.26 cm    SHUNTS MV Decel Time: 335 msec    Systemic VTI:  0.17 m MV E velocity: 63.50 cm/s  Systemic Diam: 2.30 cm MV A velocity: 79.50 cm/s MV E/A ratio:  0.80 Jenkins Rouge MD Electronically signed by Jenkins Rouge MD Signature Date/Time: 03/31/2019/2:11:49 PM    Final    VAS [place] LOWER EXTREMITY VENOUS (DVT)  Result Date: 03/31/2019  Lower Venous DVTStudy Indications: Edema, and SOB.  Limitations: Body habitus and poor ultrasound/tissue interface. Comparison Study: No prior exam. Performing Technologist: Baldwin Crown ARDMS, RVT  Examination Guidelines: A complete evaluation includes B-mode imaging, spectral Doppler, color Doppler, and power Doppler as needed of all accessible portions of each vessel. Bilateral testing is considered an integral part of a complete examination. Limited examinations for reoccurring indications may be performed as noted. The reflux portion of the exam is performed with the patient in reverse Trendelenburg.  +---------+---------------+---------+-----------+----------+------------------+ RIGHT    CompressibilityPhasicitySpontaneityPropertiesThrombus Aging      +---------+---------------+---------+-----------+----------+------------------+ CFV      Full           Yes      Yes                                     +---------+---------------+---------+-----------+----------+------------------+ SFJ      Full                                                            +---------+---------------+---------+-----------+----------+------------------+ FV Prox  Full                                                            +---------+---------------+---------+-----------+----------+------------------+ FV Mid   Full                                                            +---------+---------------+---------+-----------+----------+------------------+ FV DistalFull                                                            +---------+---------------+---------+-----------+----------+------------------+  PFV      Full                                                            +---------+---------------+---------+-----------+----------+------------------+ POP      Full           Yes      Yes                                     +---------+---------------+---------+-----------+----------+------------------+ PTV      Full                                                            +---------+---------------+---------+-----------+----------+------------------+ PERO                                                  visualized with                                                          color              +---------+---------------+---------+-----------+----------+------------------+   +---------+---------------+---------+-----------+----------+------------------+ LEFT     CompressibilityPhasicitySpontaneityPropertiesThrombus Aging     +---------+---------------+---------+-----------+----------+------------------+ CFV      Full           Yes      Yes                                      +---------+---------------+---------+-----------+----------+------------------+ SFJ      Full                                                            +---------+---------------+---------+-----------+----------+------------------+ FV Prox  Full                                                            +---------+---------------+---------+-----------+----------+------------------+ FV Mid   Full                                                            +---------+---------------+---------+-----------+----------+------------------+ FV DistalFull                                                            +---------+---------------+---------+-----------+----------+------------------+  PFV      Full                                                            +---------+---------------+---------+-----------+----------+------------------+ POP      Full           Yes      Yes                                     +---------+---------------+---------+-----------+----------+------------------+ PTV      Full                                                            +---------+---------------+---------+-----------+----------+------------------+ PERO                                                  visualized with                                                          color              +---------+---------------+---------+-----------+----------+------------------+     Summary: RIGHT: - There is no evidence of deep vein thrombosis in the lower extremity. However, portions of this examination were limited- see technologist comments above.  - No cystic structure found in the popliteal fossa. - Poorly visualized calf veins.  LEFT: - There is no evidence of deep vein thrombosis in the lower extremity. However, portions of this examination were limited- see technologist comments above.  - No cystic structure found in the popliteal fossa. - Poorly visualized calf veins.   *See table(s) above for measurements and observations. Electronically signed by Ruta Hinds MD on 03/31/2019 at 4:01:49 PM.    Final     Cardiac Studies   Echo 03/31/19:  1. Small area of inferior basal hypokinesis. Left ventricular ejection  fraction, by estimation, is 55 to 60%. The left ventricle has normal  function. Left ventricular endocardial border not optimally defined to  evaluate regional wall motion. There is  mild left ventricular hypertrophy. Left ventricular diastolic parameters  were normal.  2. Right ventricular systolic function is normal. The right ventricular  size is normal.  3. The mitral valve was not well visualized. No evidence of mitral valve  regurgitation. No evidence of mitral stenosis.  4. The aortic valve was not well visualized. Aortic valve regurgitation  is not visualized. No aortic stenosis is present.  5. The inferior vena cava is normal in size with greater than 50%  respiratory variability, suggesting right atrial pressure of 3 mmHg.   Patient Profile     54 y.o. 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 Cardiac cath with occlusion of a small PDA. No intervention performed.   Assessment & Plan    1. CAD/Inferior STEMI: Pt admitted with chest pain and altered mental status. Found to be in DKA. Initial code STEMI cancelled on 03/30/19 but on the am of 03/31/19, he had continued chest pain, elevated troponin and inferior ST elevation. Cardiac cath urgently on the morning of 03/31/19 showed occlusion of the small PDA. His other major vessels were patent. Echo with LVEF=55-60%. -Plans for medical management of CAD. Will continue ASA and Plavix. Continue beta blocker and restart statin.  -Will stop Heparin today.   2.  HTN: Can restart his Norvasc today.     For questions or updates, please contact [facility] Please consult [email or web address] for contact info under        Signed, Lauree Chandler, MD  04/02/2019, 9:35 AM

## 2019-04-02 NOTE — Progress Notes (Signed)
Inpatient Diabetes Program Recommendations  AACE/ADA: New Consensus Statement on Inpatient Glycemic Control (2015)  Target Ranges:  Prepandial:   less than 140 mg/dL      Peak postprandial:   less than 180 mg/dL (1-2 hours)      Critically ill patients:  140 - 180 mg/dL   Lab Results  Component Value Date   GLUCAP 268 (H) 04/02/2019   HGBA1C 12.0 (H) 03/30/2019    Review of Glycemic Control Results for Jesus Cruz, Jesus Cruz (MRN [id number]) as of 04/02/2019 12:44  Ref. Range 04/02/2019 11:20  Glucose-Capillary Latest Ref Range: 70 - 99 mg/dL 268 (H)   Diabetes history: Type 2 DM Outpatient Diabetes medications: Glimepiride 2 mg QHS Current orders for Inpatient glycemic control: Levemir 10 units BID, Novolog 0-20 units Q4H  Inpatient Diabetes Program Recommendations:    Consider adding Novolog 4 units TID (assuming patient is consuming >50% of meal).   Spoke with patient regarding outpatient diabetes management. Patient verifies home medications.  Reviewed patient's current A1c of 12.0%. Explained what a A1c is and what it measures. Also reviewed goal A1c with patient, importance of good glucose control @ home, and blood sugar goals. Reviewed patho fo DM, need for insulin, role of pancreas, impact on heart and blood vessels with poor glycemic control, survival skills, interventions, vascular changes and commorbidities.  Patient has a meter but only uses it occasionally. Reviewed when to check blood sugars and when to call MD.  Patient admits to drinking large quantities of sweet tea and Gatorade. Reviewed alternatives, stressed the importance of being mindful and reviewed plate method. Dietitian consult placed to reinforce. Will also order LWWDM and outpatient education.  Educated patient on insulin pen use at home. Reviewed contents of insulin flexpen starter kit. Reviewed all steps if insulin pen including attachment of needle, 2-unit air shot, dialing up dose, giving injection, removing needle,  disposal of sharps, storage of unused insulin, disposal of insulin etc. Patient able to provide successful return demonstration. Also reviewed troubleshooting with insulin pen. MD to give patient Rxs for insulin pens and insulin pen needles.    Thanks, Bronson Curb, MSN, RNC-OB Diabetes Coordinator [phone] (8a-5p)

## 2019-04-02 NOTE — Progress Notes (Signed)
Received a  Call from [place] to come to the room right away  Because the pt.is bleeding..Found that pt.was bleeding   from the HD cath site.in left I.J &  noted the cath  was  out .Applied manual pressure for 30 minutes . & Dr Oletta Darter  Was made aware.V/S taken & notified also pharmacist [place] to continue the heparin drip once bleeding stopped.Will continue to monitor pt.

## 2019-04-02 NOTE — Consult Note (Signed)
Referring Provider: Dr. Lupita Leash Primary Care Physician:  Garvin Fila, MD Primary Gastroenterologist:  Althia Forts  Reason for Consultation:  Abdominal pain, Nausea  HPI: Jesus Cruz is a 54 y.o. male admitted and treated for an ST elevated MI on Plavix and Aspirin being seen for 2 weeks of postprandial RUQ abdominal pain with nausea. Denies vomiting. Denies melena. History of heartburn. Has been having solid-food dysphagia for the past 2 weeks as well. Reports a peptic ulcer in 2016 (records not known). Hgb 14.2. LFTs, lipase wnl on 03/30/19.  Past Medical History:  Diagnosis Date  . CVA (cerebral vascular accident) ([place])   . Gout   . Hypertension   . MGUS (monoclonal gammopathy of unknown significance)   . Obesity   . OSA on CPAP   . Pulmonary embolus ([place]) 05/2012   During recovery from MI, completed Coumadin  . STEMI (ST elevation myocardial infarction) ([place]) 04/2012   Integrity BMS RCA with minor left system disease    Past Surgical History:  Procedure Laterality Date  . CORONARY ANGIOPLASTY WITH STENT PLACEMENT  04/2012   Integrity BMS RCA, minor L system dz  . LEFT HEART CATH AND CORONARY ANGIOGRAPHY N/A 03/31/2019   Procedure: LEFT HEART CATH AND CORONARY ANGIOGRAPHY;  Surgeon: Leonie Man, MD;  Location: [facility];  Service: Cardiovascular;  Laterality: N/A;  . RADIOLOGY WITH ANESTHESIA N/A 09/16/2013   Procedure: RADIOLOGY WITH ANESTHESIA;  Surgeon: Rob Hickman, MD;  Location: [facility];  Service: Radiology;  Laterality: N/A;    Prior to Admission medications   Medication Sig Start Date End Date Taking? Authorizing Provider  allopurinol (ZYLOPRIM) 300 MG tablet Take 300 mg by mouth daily.   Yes [provider]  amLODipine (NORVASC) 10 MG tablet Take 10 mg by mouth at bedtime.    Yes [provider]  aspirin EC 81 MG tablet Take 81 mg by mouth at bedtime.   Yes [provider]  atorvastatin (LIPITOR) 10 MG tablet Take 10 mg by  mouth at bedtime.  03/09/19  Yes [provider]  colchicine 0.6 MG tablet Take 0.6 mg by mouth at bedtime as needed (gout).    Yes [provider]  cyclobenzaprine (FLEXERIL) 10 MG tablet Take 10 mg by mouth 2 (two) times daily as needed. 01/22/19  Yes [provider]  doxazosin (CARDURA) 2 MG tablet Take 2 mg by mouth at bedtime. 12/24/18  Yes [provider]  furosemide (LASIX) 20 MG tablet Take 20 mg by mouth daily as needed for fluid.  03/06/19  Yes [provider]  glimepiride (AMARYL) 1 MG tablet Take 1-2 mg by mouth at bedtime.  03/07/19  Yes [provider]  HYDROcodone-acetaminophen (NORCO/VICODIN) 5-325 MG tablet Take 1 tablet by mouth 2 (two) times daily as needed for pain. 03/16/19  Yes [provider]  lisinopril (ZESTRIL) 10 MG tablet Take 10 mg by mouth daily. 12/24/18  Yes [provider]  metoprolol succinate (TOPROL-XL) 100 MG 24 hr tablet Take 300 mg by mouth daily. Take with or immediately following a meal.   Yes [provider]  morphine (MS CONTIN) 15 MG 12 hr tablet Take 15 mg by mouth 3 (three) times daily. 02/26/19  Yes [provider]  naloxone (NARCAN) nasal spray 4 mg/0.1 mL Place 1 spray into the nose once.  11/26/17  Yes [provider]  nitroGLYCERIN (NITROSTAT) 0.4 MG SL tablet Place 0.4 mg under the tongue every 5 (five) minutes as needed  for chest pain.   Yes [provider]  predniSONE (DELTASONE) 10 MG tablet Take 10 mg by mouth daily as needed. 12/24/18  Yes [provider]  pregabalin (LYRICA) 100 MG capsule Take 100 mg by mouth 3 (three) times daily.    Yes [provider]  triamcinolone cream (KENALOG) 0.5 % Apply 1 application topically 3 (three) times daily. APPLY FOR 4 WEEKS STOP FOR 1 WEEK & REPEAT AS NEEDED 03/12/19  Yes [provider]  aspirin EC 325 MG tablet TAKE 1 TABLET BY MOUTH EVERY DAY Patient not taking: Reported on  03/30/2019 11/14/13   Rosalin Hawking, MD    Scheduled Meds: . amLODipine  10 mg Oral Daily  . aspirin EC  81 mg Oral Daily  . atorvastatin  10 mg Oral q1800  . Chlorhexidine Gluconate Cloth  6 each Topical Q0600  . clopidogrel  75 mg Oral Daily  . insulin aspart  0-20 Units Subcutaneous Q4H  . insulin detemir  10 Units Subcutaneous BID  . metoCLOPramide (REGLAN) injection  10 mg Intravenous Once  . metoprolol succinate  150 mg Oral Daily  . mupirocin ointment  1 application Nasal BID  . pantoprazole (PROTONIX) IV  40 mg Intravenous Q12H  . sodium chloride flush  3 mL Intravenous Q12H  . sodium chloride flush  3 mL Intravenous Q12H  . sodium zirconium cyclosilicate  5 g Oral BID   Continuous Infusions: . sodium chloride    . heparin 2,000 Units/hr (04/02/19 0556)   PRN Meds:.sodium chloride, acetaminophen, morphine injection, ondansetron (ZOFRAN) IV, phenol, sodium chloride flush, sodium chloride flush  Allergies as of 03/30/2019 - Review Complete 03/30/2019  Allergen Reaction Noted  . Other Nausea And Vomiting 12/06/2013    History reviewed. No pertinent family history.  Social History   Socioeconomic History  . Marital status: Single    Spouse name: Not on file  . Number of children: Not on file  . Years of education: Not on file  . Highest education level: Not on file  Occupational History  . Not on file  Tobacco Use  . Smoking status: Never Smoker  . Smokeless tobacco: Never Used  Substance and Sexual Activity  . Alcohol use: No  . Drug use: Not on file  . Sexual activity: Never  Other Topics Concern  . Not on file  Social History Narrative  . Not on file   Social Determinants of Health   Financial Resource Strain:   . Difficulty of Paying Living Expenses:   Food Insecurity:   . Worried About Charity fundraiser in the Last Year:   . Arboriculturist in the Last Year:   Transportation Needs:   . Film/video editor (Medical):   Marland Kitchen Lack of Transportation  (Non-Medical):   Physical Activity:   . Days of Exercise per Week:   . Minutes of Exercise per Session:   Stress:   . Feeling of Stress :   Social Connections:   . Frequency of Communication with Friends and Family:   . Frequency of Social Gatherings with Friends and Family:   . Attends Religious Services:   . Active Member of Clubs or Organizations:   . Attends Archivist Meetings:   Marland Kitchen Marital Status:   Intimate Partner Violence:   . Fear of Current or Ex-Partner:   . Emotionally Abused:   Marland Kitchen Physically Abused:   . Sexually Abused:     Review of Systems: All negative except as  stated above in HPI.  Physical Exam: Vital signs: Vitals:   04/02/19 0559 04/02/19 0810  BP: (!) 140/99 (!) 155/101  Pulse: 63 69  Resp: 17 20  Temp:  (!) 97.1 F (36.2 C)  SpO2: 97% 94%   Last BM Date: 03/30/19 General:  Obese, lethargic, mild acute distress Head: normocephalic, atraumatic Eyes: anicteric sclera ENT: oropharynx clear Neck: supple, nontender Lungs:  Clear throughout to auscultation.   No wheezes, crackles, or rhonchi. No acute distress. Heart:  Regular rate and rhythm; no murmurs, clicks, rubs,  or gallops. Abdomen: RUQ tenderness with guarding, soft, nondistended, +BS, obese  Rectal:  Deferred Ext: no edema  GI:  Lab Results: Recent Labs    03/31/19 0222 04/01/19 0305 04/02/19 0355  WBC 33.8* 24.7* 17.6*  HGB 14.8 13.8 14.2  HCT 42.5 41.5 44.2  PLT 364 275 265   BMET Recent Labs    03/31/19 2306 04/01/19 0305 04/02/19 0355  NA 136 135 139  K 5.2* 5.1 5.6*  CL 105 103 105  CO2 23 22 25   GLUCOSE 195* 151* 96  BUN 69* 66* 45*  CREATININE 1.78* 1.64* 1.35*  CALCIUM 8.6* 8.5* 8.9   LFT Recent Labs    03/30/19 1451 03/31/19 0411 04/02/19 0355  PROT 7.4  --   --   ALBUMIN 3.0*   < > 2.5*  AST 40  --   --   ALT 37  --   --   ALKPHOS 224*  --   --   BILITOT 0.9  --   --    < > = values in this interval not displayed.   PT/INR No results  for input(s): LABPROT, INR in the last 72 hours.   Studies/Results: DG Abd 1 View  Result Date: 04/02/2019 CLINICAL DATA:  Nausea and vomiting EXAM: ABDOMEN - 1 VIEW COMPARISON:  07/23/2016 FINDINGS: Scattered large and small bowel gas is noted. No obstructive changes are seen. Postsurgical changes in the lumbar spine are noted as well as a thoracic spinal stimulator. No other focal abnormality is seen. IMPRESSION: No acute abnormality in the abdomen. Electronically Signed   By: Inez Catalina M.D.   On: 04/02/2019 09:41   DG CHEST PORT 1 VIEW  Result Date: 04/02/2019 CLINICAL DATA:  Shortness of breath EXAM: PORTABLE CHEST 1 VIEW COMPARISON:  03/30/2019 FINDINGS: Cardiac shadow is stable. Previously seen vascular congestion has resolved. Left jugular central line is been removed in the interval. Mild left basilar atelectasis is seen. IMPRESSION: Improved vascular congestion. Persistent left basilar atelectasis is noted. Electronically Signed   By: Inez Catalina M.D.   On: 04/02/2019 09:40    Impression/Plan: 54 yo s/p ST elevated MI being medically managed seen for a GI consult due to RUQ abdominal pain and nausea with dysphagia. Abd CT with contrast (IV if possible but due to renal insufficiency may need to be done with oral contrast only). Continue Protonix 40 mg IV Q 12 hours. May need Diflucan or Nystatin for empiric treatment for possible Candida in esophagus. [facility] will f/u tomorrow.    LOS: 3 days   Lear Ng  04/02/2019, 11:11 AM  Questions please call [phone]

## 2019-04-03 ENCOUNTER — Inpatient Hospital Stay (HOSPITAL_COMMUNITY): Payer: Medicare Other

## 2019-04-03 LAB — CBC
HCT: 42.4 % (ref 39.0–52.0)
Hemoglobin: 13.8 g/dL (ref 13.0–17.0)
MCH: 30.6 pg (ref 26.0–34.0)
MCHC: 32.5 g/dL (ref 30.0–36.0)
MCV: 94 fL (ref 80.0–100.0)
Platelets: 289 10*3/uL (ref 150–400)
RBC: 4.51 MIL/uL (ref 4.22–5.81)
RDW: 12.7 % (ref 11.5–15.5)
WBC: 18.2 10*3/uL — ABNORMAL HIGH (ref 4.0–10.5)
nRBC: 0 % (ref 0.0–0.2)

## 2019-04-03 LAB — RENAL FUNCTION PANEL
Albumin: 2.6 g/dL — ABNORMAL LOW (ref 3.5–5.0)
Anion gap: 10 (ref 5–15)
BUN: 30 mg/dL — ABNORMAL HIGH (ref 6–20)
CO2: 19 mmol/L — ABNORMAL LOW (ref 22–32)
Calcium: 8.9 mg/dL (ref 8.9–10.3)
Chloride: 107 mmol/L (ref 98–111)
Creatinine, Ser: 1.22 mg/dL (ref 0.61–1.24)
GFR calc Af Amer: >60 mL/min (ref >60)
GFR calc non Af Amer: >60 mL/min (ref >60)
Glucose, Bld: 92 mg/dL (ref 70–99)
Phosphorus: 3.4 mg/dL (ref 2.5–4.6)
Potassium: 5.1 mmol/L (ref 3.5–5.1)
Sodium: 136 mmol/L (ref 135–145)

## 2019-04-03 LAB — GLUCOSE, CAPILLARY
Glucose-Capillary: 126 mg/dL — ABNORMAL HIGH (ref 70–99)
Glucose-Capillary: 143 mg/dL — ABNORMAL HIGH (ref 70–99)
Glucose-Capillary: 209 mg/dL — ABNORMAL HIGH (ref 70–99)
Glucose-Capillary: 218 mg/dL — ABNORMAL HIGH (ref 70–99)
Glucose-Capillary: 296 mg/dL — ABNORMAL HIGH (ref 70–99)
Glucose-Capillary: 93 mg/dL (ref 70–99)

## 2019-04-03 LAB — MAGNESIUM: Magnesium: 2 mg/dL (ref 1.7–2.4)

## 2019-04-03 MED ORDER — BENZONATATE 100 MG PO CAPS
200.0000 mg | ORAL_CAPSULE | Freq: Three times a day (TID) | ORAL | Status: DC | PRN
  Administered 2019-04-03: 200 mg via ORAL
  Filled 2019-04-03: qty 2

## 2019-04-03 MED ORDER — HYDROCODONE-ACETAMINOPHEN 5-325 MG PO TABS
1.0000 | ORAL_TABLET | ORAL | Status: DC | PRN
  Administered 2019-04-03 – 2019-04-07 (×5): 1 via ORAL
  Filled 2019-04-03 (×5): qty 1

## 2019-04-03 MED ORDER — CYCLOBENZAPRINE HCL 10 MG PO TABS: 10.0000 mg | ORAL_TABLET | Freq: Three times a day (TID) | ORAL | Status: DC | PRN

## 2019-04-03 MED ORDER — MORPHINE SULFATE ER 15 MG PO TBCR
15.0000 mg | EXTENDED_RELEASE_TABLET | Freq: Three times a day (TID) | ORAL | Status: DC
  Administered 2019-04-03 – 2019-04-07 (×13): 15 mg via ORAL
  Filled 2019-04-03 (×13): qty 1

## 2019-04-03 MED ORDER — PREGABALIN 100 MG PO CAPS
100.0000 mg | ORAL_CAPSULE | Freq: Three times a day (TID) | ORAL | Status: DC
  Administered 2019-04-03 – 2019-04-07 (×14): 100 mg via ORAL
  Filled 2019-04-03 (×14): qty 1

## 2019-04-03 MED ORDER — COLCHICINE 0.6 MG PO TABS
0.6000 mg | ORAL_TABLET | Freq: Two times a day (BID) | ORAL | Status: DC
  Administered 2019-04-03 – 2019-04-07 (×9): 0.6 mg via ORAL
  Filled 2019-04-03 (×9): qty 1

## 2019-04-03 NOTE — Progress Notes (Signed)
Dorsalis Pedis, Posterior Tibial, Popliteal and Femoral pulses on Rt leg present by doppler. Posterior tibial was more faint. Dorsalis Pedis pulse strong and able to palpable.

## 2019-04-03 NOTE — Progress Notes (Signed)
Pt had 22 bts NSVT on tele while lying in bed.  BP 121/81, pt denies any c/o.  K, Mg, EF WNL.  [facility] mid level notified via [place].  Will cont to monitor pt closely.

## 2019-04-03 NOTE — Progress Notes (Signed)
[facility] Progress Note  Subjective: Patient still has some epigastric and right upper quadrant pain today.  CT scan did not reveal anything to explain this.  He does have a history of a peptic ulcer disease several years ago.  He says this pain feels similar to then.  Objective: Vital signs in last 24 hours: Temp:  [98.2 F (36.8 C)-99.3 F (37.4 C)] 99.3 F (37.4 C) (03/27 1654) Pulse Rate:  [47-77] 76 (03/27 1654) Resp:  [12-22] 21 (03/27 1654) BP: (122-141)/(79-94) 130/93 (03/27 1654) SpO2:  [88 %-98 %] 95 % (03/27 1654) Weight:  [137.5 kg] 137.5 kg (03/27 0448) Weight change:    PE:  No distress  Heart regular rhythm  Abdomen soft some epigastric tenderness  Lab Results: Results for orders placed or performed during the hospital encounter of 03/30/19 (from the past 24 hour(s))  Basic metabolic panel     Status: Abnormal   Collection Time: 04/02/19  6:42 PM  Result Value Ref Range   Sodium 134 (L) 135 - 145 mmol/L   Potassium 5.3 (H) 3.5 - 5.1 mmol/L   Chloride 104 98 - 111 mmol/L   CO2 22 22 - 32 mmol/L   Glucose, Bld 138 (H) 70 - 99 mg/dL   BUN 38 (H) 6 - 20 mg/dL   Creatinine, Ser 1.25 (H) 0.61 - 1.24 mg/dL   Calcium 8.6 (L) 8.9 - 10.3 mg/dL   GFR calc non Af Amer >60 >60 mL/min   GFR calc Af Amer >60 >60 mL/min   Anion gap 8 5 - 15  Glucose, capillary     Status: Abnormal   Collection Time: 04/02/19  8:14 PM  Result Value Ref Range   Glucose-Capillary 198 (H) 70 - 99 mg/dL   Comment 1 Notify RN    Comment 2 Document in Chart   Glucose, capillary     Status: Abnormal   Collection Time: 04/03/19 12:00 AM  Result Value Ref Range   Glucose-Capillary 209 (H) 70 - 99 mg/dL   Comment 1 Notify RN    Comment 2 Document in Chart   Magnesium     Status: None   Collection Time: 04/03/19  3:58 AM  Result Value Ref Range   Magnesium 2.0 1.7 - 2.4 mg/dL  CBC     Status: Abnormal   Collection Time: 04/03/19  3:58 AM  Result Value Ref Range   WBC 18.2  (H) 4.0 - 10.5 K/uL   RBC 4.51 4.22 - 5.81 MIL/uL   Hemoglobin 13.8 13.0 - 17.0 g/dL   HCT 42.4 39.0 - 52.0 %   MCV 94.0 80.0 - 100.0 fL   MCH 30.6 26.0 - 34.0 pg   MCHC 32.5 30.0 - 36.0 g/dL   RDW 12.7 11.5 - 15.5 %   Platelets 289 150 - 400 K/uL   nRBC 0.0 0.0 - 0.2 %  Renal function panel     Status: Abnormal   Collection Time: 04/03/19  3:58 AM  Result Value Ref Range   Sodium 136 135 - 145 mmol/L   Potassium 5.1 3.5 - 5.1 mmol/L   Chloride 107 98 - 111 mmol/L   CO2 19 (L) 22 - 32 mmol/L   Glucose, Bld 92 70 - 99 mg/dL   BUN 30 (H) 6 - 20 mg/dL   Creatinine, Ser 1.22 0.61 - 1.24 mg/dL   Calcium 8.9 8.9 - 10.3 mg/dL   Phosphorus 3.4 2.5 - 4.6 mg/dL   Albumin 2.6 (L) 3.5 - 5.0 g/dL  GFR calc non Af Amer >60 >60 mL/min   GFR calc Af Amer >60 >60 mL/min   Anion gap 10 5 - 15  Glucose, capillary     Status: None   Collection Time: 04/03/19  4:45 AM  Result Value Ref Range   Glucose-Capillary 93 70 - 99 mg/dL   Comment 1 Notify RN    Comment 2 Document in Chart   Glucose, capillary     Status: Abnormal   Collection Time: 04/03/19  7:59 AM  Result Value Ref Range   Glucose-Capillary 143 (H) 70 - 99 mg/dL  Glucose, capillary     Status: Abnormal   Collection Time: 04/03/19 11:57 AM  Result Value Ref Range   Glucose-Capillary 126 (H) 70 - 99 mg/dL  Glucose, capillary     Status: Abnormal   Collection Time: 04/03/19  4:53 PM  Result Value Ref Range   Glucose-Capillary 218 (H) 70 - 99 mg/dL    Studies/Results: DG Knee 1-2 Views Right  Result Date: 04/03/2019 CLINICAL DATA:  No known injury EXAM: RIGHT KNEE - 1-2 VIEW COMPARISON:  None. FINDINGS: Generalized osteopenia. No fracture or dislocation. No aggressive osseous lesion. Mild patellofemoral compartment and medial femorotibial compartment joint space narrowing with marginal osteophytes. Chondrocalcinosis of the medial and lateral femorotibial compartments as can be seen with CPPD. Peripheral vascular atherosclerotic  disease. Soft tissue dystrophic calcifications along the anterior aspect of the knee. IMPRESSION: No acute osseous injury of the right knee. Mild osteoarthritis of the patellofemoral compartment and medial femorotibial compartment. Electronically Signed   By: Kathreen Devoid   On: 04/03/2019 11:12      Assessment: Epigastric pain.  Possible peptic ulcer disease  Plan:   Given overall clinical situation for now I would recommend treating his symptoms with PPI therapy and observing.  We will see how he does clinically.  Hold off on EGD for now.  Consider upper GI.    Cassell Clement 04/03/2019, 4:56 PM  Pager: [phone] If no answer or after 5 PM call [phone]

## 2019-04-03 NOTE — Progress Notes (Signed)
PROGRESS NOTE    Jesus Cruz  VQQ:[id number] DOB: 1965-08-24 DOA: 03/30/2019 PCP: Garvin Fila, MD   Brief Narrative: 54 year old gentleman with complex comorbidities including CAD ST elevation MI in 2014, hypertension, PE, OSA on CPAP, obesity, hypertension, history of stroke, diabetes mellitus presented via EMS with complaint of chest pain, code STEMI-with complaint of shortness of breath, diaphoresis on arrival, seen by cardiology found to have renal failure hyperkalemia, DKA-was admitted to ICU and managed by PCCM/nephrology/cardiology and underwent cardiac cath 3/24 for inferior ST EMI-occlusion of the PDA other vessels were patent Echo with EF 55 to 60% and continued on medical management aspirin Plavix, heparin. Patient is managed for hyperglycemia, hyperkalemia, MI, volume overload, plan was to start CRRT but he responded well to Lasix.  Patient was stabilized and transferred to [facility] admit 3/26.  Subjective:  Complains of some abdominal discomfort but mostly right knee right leg hurting, darkish discoloration on the right more than left leg, tinnitus and difficulty with mobility due to pain.  Reports because of his back surgery cannot lift up his leg-is on multiple pain medication including MS Contin muscle relaxant and colchicine and resume this morning.   Assessment & Plan:  CAD/inferior ST elevation MI: Status post cardiac cath 3/24,occlusion of the PDA other vessels were patent Echo with EF 55 to 60% and continued on medical management aspirin Plavix, metoprolol 150 mg.  Continue plan as per cardiology.  Continue low-dose statin due to Diflucan  Acute renal failure and CKD/hyperkalemia-reported baseline CKD stage III (unclear type a or b): Nephrology on board signed out 3/25, urine output 3485 past 24 hours, creatinine improved and potassium has improved at 5.1 with Lokelma. HD catheter got pulled out 3/26 Recent Labs  Lab 03/31/19 2306 04/01/19 0305 04/02/19 0355 04/02/19  1842 04/03/19 0358  BUN 69* 66* 45* 38* 30*  CREATININE 1.78* 1.64* 1.35* 1.25* 1.22   Acute hypoxic respiratory failure secondary to volume overload: On nasal cannula oxygen, fluid management as per nephrology, continue supplemental oxygen and wean as tolerated.  Intermittent diuresis per nephrology. Chest x-ray intermittently- cxr this am no acute abnormalities, on 2l [place]- continue to wean off.  DM with HHS -HHS resolved: Poorly controlled hemoglobin A1c 12.0 (03/2319), blood sugar overall is stable on Lantus 10 units twice daily and sliding scale insulin.  Diabetic cardiology on board recommending NovoLog 4 u, 3 times daily Premeal if more than 50% intake. Monitor and adjust.  CT scan shows a scattering in the right kidney leading to relative right renal atrophy. Recent Labs  Lab 04/02/19 1616 04/02/19 2014 04/03/19 0000 04/03/19 0445 04/03/19 0759  GLUCAP 127* 198* 209* 93 143*   Abdominal discomfort pain- post prandial- for few weeks- add ppi q12 hr, diflucan for thrushs and reglan x1, check xray abd. reports hx of ulcer in 2016- used to see Dr Thana Farr.  Discussed Dr.  Oswald Hillock input continue current management CT abdomen pelvis no acute finding.    Essential hypertension: stable.  Continue amlodipine, metoprolol.  History of PE-DVT ultrasound negative less likely for PE can consider VQ scan if ongoing concern.Patient off heparin drip for his MI, does not appears to be on anticoagulation prior to admission.  Continue nasal cannula continue to wean off oxygen as tolerated  LE wounds/wounds and- cont wound care  Rt knee pain- reports he had steroid recently and supposed to have another few days ago. Appears tender. Monitor cont tylenol/pain control. on multiple pain medication including MS Contin muscle relaxant and colchicine and  resume this morning , ordered ABI ( if able to obtain)/doppler, xray knee and, orthopedic consulted.  Leukocytosis: 28.6 on admission but afebrile-?  Reactive versus possible LLE cellulitis.  Procalcitonin 0.4 subsequently 0.4 and antibiotic cefepime was discontinued in ICU. Recent Labs  Lab 03/30/19 1451 03/31/19 0222 04/01/19 0305 04/02/19 0355 04/03/19 0358  WBC 28.6* 33.8* 24.7* 17.6* 18.2*   Plaque in posterior pharynx : Started on Diflucan empirically for thrush 3/26.    Deconditioning/debility : PT OT eval and ambulation  Morbid obesity BMI 49.3 he will benefit with weight loss.  Bilateral Nonobstructive nephrolithiasis seen in the CT scan follow-up with urology. 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 Postop finding in the lumbar spine  On chronic pain medication with opiates -Lyrica morphine and Norco.   Nutrition: Diet Order            Diet full liquid Room service appropriate? Yes; Fluid consistency: Thin  Diet effective now              Pressure Ulcer: Pressure Injury 03/31/19 Heel Right;Medial Stage 2 -  Partial thickness loss of dermis presenting as a shallow open injury with a red, pink wound bed without slough. (Active)  03/31/19 1104  Location: Heel  Location Orientation: Right;Medial  Staging: Stage 2 -  Partial thickness loss of dermis presenting as a shallow open injury with a red, pink wound bed without slough.  Wound Description (Comments):   Present on Admission: Yes    DVT prophylaxis:Heparin Code Status: FULL Family Communication: plan of care discussed with patient at bedside. Disposition Plan: Patient is from:HOME Anticipated Disposition: to home health PT  Barriers to discharge or conditions that needs to be met prior to discharge: Patient admitted with ST elevation MI renal failure hypoglycemia in ICU transferred to medical floor on 3/26 mains hospitalized with deconditioning ongoing right leg knee pain.  Consultants:see note  Procedures:  Left IJ HD cath 3/23- got pulled out 3/26  Echo 03/31/19: 1. Small area of inferior basal hypokinesis.  Left ventricular ejection  fraction, by estimation, is 55 to 60%. The left ventricle has normal  function. Left ventricular endocardial border not optimally defined to  evaluate regional wall motion. There is  mild left ventricular hypertrophy. Left ventricular diastolic parameters  were normal.  2. Right ventricular systolic function is normal. The right ventricular  size is normal.  3. The mitral valve was not well visualized. No evidence of mitral valve  regurgitation. No evidence of mitral stenosis.  4. The aortic valve was not well visualized. Aortic valve regurgitation  is not visualized. No aortic stenosis is present.  5. The inferior vena cava is normal in size with greater than 50%  respiratory variability, suggesting right atrial pressure of 3 mmHg.   3/24 B/L DVT US >> RIGHT:  - There is no evidence of deep vein thrombosis in the lower extremity.  However, portions of this examination were limited- see technologist  comments above.  - No cystic structure found in the popliteal fossa.  - Poorly visualized calf veins.    LEFT:  - There is no evidence of deep vein thrombosis in the lower extremity.  However, portions of this examination were limited- see technologist  comments above.  - No cystic structure found in the popliteal fossa.  - Poorly visualized calf veins.   3/24 LHC >>  LIKELY CULPRIT LESION: Mid PDA (small caliber) 100% -> not good PCI target.  Prox RCA lesion  is 30% stenosed.  Previously placed Prox RCA to Mid RCA stent (unknown type) is widely patent.  Mid RCA lesion is 60% stenosed.  1st Diag/Ramus lesion is 40% stenosed.  Ramus/OM lesion is 50% stenosed.  1st Mrg/Distal LCx lesion is 60% stenosed.  LV end diastolic pressure is normal.  Microbiology:see note   Medications: Scheduled Meds: . amLODipine  10 mg Oral Daily  . aspirin EC  81 mg Oral Daily  . atorvastatin  10 mg Oral q1800  . Chlorhexidine Gluconate Cloth  6 each  Topical Q0600  . clopidogrel  75 mg Oral Daily  . colchicine  0.6 mg Oral BID  . insulin aspart  0-20 Units Subcutaneous Q4H  . insulin detemir  10 Units Subcutaneous BID  . metoprolol succinate  150 mg Oral Daily  . morphine  15 mg Oral TID  . mupirocin ointment  1 application Nasal BID  . pantoprazole (PROTONIX) IV  40 mg Intravenous Q12H  . pregabalin  100 mg Oral TID  . sodium chloride flush  3 mL Intravenous Q12H  . sodium chloride flush  3 mL Intravenous Q12H   Continuous Infusions: . sodium chloride    . fluconazole (DIFLUCAN) IV 200 mg (04/02/19 1533)    Antimicrobials: Anti-infectives (From admission, onward)   Start     Dose/Rate Route Frequency Ordered Stop   04/02/19 1400  fluconazole (DIFLUCAN) IVPB 200 mg     200 mg 100 mL/hr over 60 Minutes Intravenous Every 24 hours 04/02/19 1331 04/07/19 1359   04/01/19 1000  ceFEPIme (MAXIPIME) 2 g in sodium chloride 0.9 % 100 mL IVPB  Status:  Discontinued     2 g 200 mL/hr over 30 Minutes Intravenous Every 8 hours 04/01/19 0850 04/01/19 1156   03/31/19 1700  vancomycin (VANCOREADY) IVPB 1500 mg/300 mL  Status:  Discontinued     1,500 mg 150 mL/hr over 120 Minutes Intravenous Every 24 hours 03/30/19 1619 03/31/19 1012   03/31/19 1015  vancomycin (VANCOCIN) 2,500 mg in sodium chloride 0.9 % 500 mL IVPB  Status:  Discontinued     2,500 mg 250 mL/hr over 120 Minutes Intravenous  Once 03/31/19 1012 03/31/19 1225   03/30/19 1630  vancomycin (VANCOCIN) 2,500 mg in sodium chloride 0.9 % 500 mL IVPB  Status:  Discontinued     2,500 mg 250 mL/hr over 120 Minutes Intravenous  Once 03/30/19 1619 03/31/19 1012   03/30/19 1630  ceFEPIme (MAXIPIME) 2 g in sodium chloride 0.9 % 100 mL IVPB  Status:  Discontinued     2 g 200 mL/hr over 30 Minutes Intravenous Every 12 hours 03/30/19 1619 04/01/19 0850   03/30/19 1615  ceFEPIme (MAXIPIME) 2 g in sodium chloride 0.9 % 100 mL IVPB  Status:  Discontinued     2 g 200 mL/hr over 30 Minutes  Intravenous  Once 03/30/19 1608 03/30/19 1619   03/30/19 1615  metroNIDAZOLE (FLAGYL) IVPB 500 mg     500 mg 100 mL/hr over 60 Minutes Intravenous  Once 03/30/19 1608 03/30/19 1837   03/30/19 1615  vancomycin (VANCOCIN) IVPB 1000 mg/200 mL premix  Status:  Discontinued     1,000 mg 200 mL/hr over 60 Minutes Intravenous  Once 03/30/19 1608 03/30/19 1618       Objective: Vitals: Today's Vitals   04/03/19 0839 04/03/19 0900 04/03/19 1007 04/03/19 1054  BP: (!) 141/85   127/80  Pulse: 71   77  Resp: (!) 22     Temp: 98.6 F (37 C)  TempSrc: Oral     SpO2: 98%     Weight:      Height:      PainSc:  10-Worst pain ever 7      Intake/Output Summary (Last 24 hours) at 04/03/2019 1144 Last data filed at 04/03/2019 0440 Gross per 24 hour  Intake 300.03 ml  Output 2775 ml  Net -2474.97 ml   Filed Weights   03/31/19 0114 04/01/19 0500 04/03/19 0448  Weight: (!) 142.6 kg (!) 142.9 kg (!) 137.5 kg   Weight change:    Intake/Output from previous day: 03/26 0701 - 03/27 0700 In: 900 [P.O.:800; IV Piggyback:100] Out: 3560 [Urine:3485; Stool:75] Intake/Output this shift: No intake/output data recorded.  Examination: General exam: Alert awake anxious, obese, HEENT:Oral mucosa moist, Ear/Nose WNL grossly,dentition normal. Respiratory system: bilaterally clear,no wheezing or crackles,no use of accessory muscle, non tender. Cardiovascular system:S1 & S2 +,regular,No JVD. Gastrointestinal system:Abdomen soft, obese, mildly tender nonspecific on the mid abdomen, BS+. Nervous System:Alert, awake, moving extremities and grossly non-focal. Extremities:Rt knee tender- erythema on RLE with hyperpigmentation discoloration more than the left leg, deformities presents on upper extremities and joint 71fom gout per patient).  Able to feel ant tibilais pulses on LE Skin:No rashes,no icterus. MXMD:YJWLKHmuscle bulk,tone,power.  Data Reviewed: I have personally reviewed following labs and  imaging studies CBC: Recent Labs  Lab 03/30/19 1451 03/30/19 1456 03/30/19 1824 03/31/19 0222 04/01/19 0305 04/02/19 0355 04/03/19 0358  WBC 28.6*  --   --  33.8* 24.7* 17.6* 18.2*  NEUTROABS 25.5*  --   --   --   --   --   --   HGB 15.2   < > 15.6 14.8 13.8 14.2 13.8  HCT 45.7   < > 46.0 42.5 41.5 44.2 42.4  MCV 91.4  --   --  88.9 92.6 94.6 94.0  PLT 454*  --   --  364 275 265 289   < > = values in this interval not displayed.   Basic Metabolic Panel: Recent Labs  Lab 03/30/19 1813 03/30/19 1824 03/30/19 2209 03/31/19 0222 03/31/19 0411 03/31/19 1023 03/31/19 1604 03/31/19 1604 03/31/19 2306 04/01/19 0305 04/02/19 0355 04/02/19 1842 04/03/19 0358  NA 130*   < >   < >  --  133*   < > 136   < > 136 135 139 134* 136  K 6.3*   < >   < >  --  5.5*   < > 5.8*   < > 5.2* 5.1 5.6* 5.3* 5.1  CL 91*   < >   < >  --  98   < > 102   < > 105 103 105 104 107  CO2 23   < >   < >  --  25   < > 21*   < > '23 22 25 22 ' 19*  GLUCOSE 531*   < >   < >  --  235*   < > 154*   < > 195* 151* 96 138* 92  BUN 96*   < >   < >  --  89*   < > 81*   < > 69* 66* 45* 38* 30*  CREATININE 3.18*   < >   < >  --  2.60*   < > 2.06*   < > 1.78* 1.64* 1.35* 1.25* 1.22  CALCIUM 9.7   < >   < >  --  8.9   < > 8.7*   < >  8.6* 8.5* 8.9 8.6* 8.9  MG 2.6*  --   --  2.4  --   --   --   --   --  2.3 2.2  --  2.0  PHOS 4.9*   < >   < > 4.3 4.4  --  3.7  --   --  3.3 3.5  --  3.4   < > = values in this interval not displayed.   GFR: Estimated Creatinine Clearance: 92.7 mL/min (by C-G formula based on SCr of 1.22 mg/dL). Liver Function Tests: Recent Labs  Lab 03/30/19 1451 03/30/19 1451 03/31/19 0411 03/31/19 1604 04/01/19 0305 04/02/19 0355 04/03/19 0358  AST 40  --   --   --   --  33  --   ALT 37  --   --   --   --  32  --   ALKPHOS 224*  --   --   --   --  105  --   BILITOT 0.9  --   --   --   --  1.0  --   PROT 7.4  --   --   --   --  7.0  --   ALBUMIN 3.0*   < > 2.5* 2.6* 2.3* 2.6*  2.5* 2.6*   <  > = values in this interval not displayed.   Recent Labs  Lab 03/30/19 1451 04/02/19 0355  LIPASE 40 55*   Recent Labs  Lab 03/30/19 1512  AMMONIA 31   Coagulation Profile: No results for input(s): INR, PROTIME in the last 168 hours. Cardiac Enzymes: No results for input(s): CKTOTAL, CKMB, CKMBINDEX, TROPONINI in the last 168 hours. BNP (last 3 results) No results for input(s): PROBNP in the last 8760 hours. HbA1C: No results for input(s): HGBA1C in the last 72 hours. CBG: Recent Labs  Lab 04/02/19 1616 04/02/19 2014 04/03/19 0000 04/03/19 0445 04/03/19 0759  GLUCAP 127* 198* 209* 93 143*   Lipid Profile: Recent Labs    04/01/19 0305  CHOL 118  HDL 27*  LDLCALC 52  TRIG 193*  CHOLHDL 4.4   Thyroid Function Tests: No results for input(s): TSH, T4TOTAL, FREET4, T3FREE, THYROIDAB in the last 72 hours. Anemia Panel: No results for input(s): VITAMINB12, FOLATE, FERRITIN, TIBC, IRON, RETICCTPCT in the last 72 hours. Sepsis Labs: Recent Labs  Lab 03/30/19 1456 03/30/19 1813 03/31/19 0222 04/01/19 0305 04/02/19 0355  PROCALCITON  --  0.46 0.53 0.41 0.29  LATICACIDVEN 3.4* 2.8*  --   --   --     Recent Results (from the past 240 hour(s))  Respiratory Panel by RT PCR (Flu A&B, Covid) - Nasopharyngeal Swab     Status: None   Collection Time: 03/30/19  2:39 PM   Specimen: Nasopharyngeal Swab  Result Value Ref Range Status   SARS Coronavirus 2 by RT PCR NEGATIVE NEGATIVE Final    Comment: (NOTE) SARS-CoV-2 target nucleic acids are NOT DETECTED. The SARS-CoV-2 RNA is generally detectable in upper respiratoy specimens during the acute phase of infection. The lowest concentration of SARS-CoV-2 viral copies this assay can detect is 131 copies/mL. A negative result does not preclude SARS-Cov-2 infection and should not be used as the sole basis for treatment or other patient management decisions. A negative result may occur with  improper specimen  collection/handling, submission of specimen other than nasopharyngeal swab, presence of viral mutation(s) within the areas targeted by this assay, and inadequate number of viral copies (<131 copies/mL). A negative  result must be combined with clinical observations, patient history, and epidemiological information. The expected result is Negative. Fact Sheet for Patients:  PinkCheek.be Fact Sheet for Healthcare Providers:  GravelBags.it This test is not yet ap proved or cleared by the [place] FDA and  has been authorized for detection and/or diagnosis of SARS-CoV-2 by FDA under an Emergency Use Authorization (EUA). This EUA will remain  in effect (meaning this test can be used) for the duration of the COVID-19 declaration under Section 564(b)(1) of the Act, 21 U.S.C. section 360bbb-3(b)(1), unless the authorization is terminated or revoked sooner.    Influenza A by PCR NEGATIVE NEGATIVE Final   Influenza B by PCR NEGATIVE NEGATIVE Final    Comment: (NOTE) The Xpert Xpress SARS-CoV-2/FLU/RSV assay is intended as an aid in  the diagnosis of influenza from Nasopharyngeal swab specimens and  should not be used as a sole basis for treatment. Nasal washings and  aspirates are unacceptable for Xpert Xpress SARS-CoV-2/FLU/RSV  testing. Fact Sheet for Patients: PinkCheek.be Fact Sheet for Healthcare Providers: GravelBags.it This test is not yet approved or cleared by the [place] FDA and  has been authorized for detection and/or diagnosis of SARS-CoV-2 by  FDA under an Emergency Use Authorization (EUA). This EUA will remain  in effect (meaning this test can be used) for the duration of the  Covid-19 declaration under Section 564(b)(1) of the Act, 21  U.S.C. section 360bbb-3(b)(1), unless the authorization is  terminated or revoked. Performed at [facility], [place] [address], [place], [place] [address]   Culture, blood (routine x 2)     Status: None (Preliminary result)   Collection Time: 03/30/19  2:50 PM   Specimen: BLOOD  Result Value Ref Range Status   Specimen Description BLOOD RIGHT ANTECUBITAL  Final   Special Requests   Final    BOTTLES DRAWN AEROBIC AND ANAEROBIC Blood Culture adequate volume   Culture   Final    NO GROWTH 3 DAYS Performed at [facility], [place] [address], [place], [place] [address]    Report Status PENDING  Incomplete  Culture, blood (routine x 2)     Status: None (Preliminary result)   Collection Time: 03/30/19  4:47 PM   Specimen: BLOOD LEFT FOREARM  Result Value Ref Range Status   Specimen Description BLOOD LEFT FOREARM  Final   Special Requests   Final    BOTTLES DRAWN AEROBIC AND ANAEROBIC Blood Culture adequate volume   Culture   Final    NO GROWTH 3 DAYS Performed at [facility], [place] [address], [place], [place] [address]    Report Status PENDING  Incomplete  MRSA PCR Screening     Status: Abnormal   Collection Time: 03/30/19  4:54 PM   Specimen: Nasopharyngeal  Result Value Ref Range Status   MRSA by PCR POSITIVE (A) NEGATIVE Final    Comment:        The GeneXpert MRSA Assay (FDA approved for NASAL specimens only), is one component of a comprehensive MRSA colonization surveillance program. It is not intended to diagnose MRSA infection nor to guide or monitor treatment for MRSA infections. RESULT CALLED TO, READ BACK BY AND VERIFIED WITH: R ROBERTS RN 03/30/19 1853 JDW Performed at [facility], [place] [address], [place], [place] [address]       Radiology Studies: DG Knee 1-2 Views Right  Result Date: 04/03/2019 CLINICAL DATA:  No known injury EXAM: RIGHT KNEE - 1-2  VIEW COMPARISON:  None. FINDINGS: Generalized osteopenia. No fracture or dislocation. No aggressive osseous lesion. Mild patellofemoral compartment and medial femorotibial compartment joint space narrowing with  marginal osteophytes. Chondrocalcinosis of the medial and lateral femorotibial compartments as can be seen with CPPD. Peripheral vascular atherosclerotic disease. Soft tissue dystrophic calcifications along the anterior aspect of the knee. IMPRESSION: No acute osseous injury of the right knee. Mild osteoarthritis of the patellofemoral compartment and medial femorotibial compartment. Electronically Signed   By: Kathreen Devoid   On: 04/03/2019 11:12   DG Abd 1 View  Result Date: 04/02/2019 CLINICAL DATA:  Nausea and vomiting EXAM: ABDOMEN - 1 VIEW COMPARISON:  07/23/2016 FINDINGS: Scattered large and small bowel gas is noted. No obstructive changes are seen. Postsurgical changes in the lumbar spine are noted as well as a thoracic spinal stimulator. No other focal abnormality is seen. IMPRESSION: No acute abnormality in the abdomen. Electronically Signed   By: Inez Catalina M.D.   On: 04/02/2019 09:41   CT ABDOMEN PELVIS W CONTRAST  Result Date: 04/02/2019 CLINICAL DATA:  Right upper quadrant abdominal pain. Renal failure. Shortness of breath with difficulty swallowing. EXAM: CT ABDOMEN AND PELVIS WITH CONTRAST TECHNIQUE: Multidetector CT imaging of the abdomen and pelvis was performed using the standard protocol following bolus administration of intravenous contrast. CONTRAST:  132m OMNIPAQUE IOHEXOL 300 MG/ML  SOLN COMPARISON:  Renal ultrasound 03/30/2019 and CT abdomen from 01/27/2017 FINDINGS: Lower chest: There is atelectasis in the left lower lobe along with subsegmental atelectasis in the lingula. Left anterior descending, circumflex, and right coronary artery atherosclerosis. Borderline elevated left hemidiaphragm. Hepatobiliary: Unremarkable Pancreas: Unremarkable Spleen: Unremarkable Adrenals/Urinary Tract: Both adrenal glands appear normal. Scarring in the right kidney leading to relative right renal atrophy. The adrenal glands appear unremarkable. There about 7 tiny punctate nonobstructive right renal  calculi measuring up to 2 mm in diameter. They were 5 nonobstructive punctate left renal calculi likewise measuring up to 2 mm in diameter. No hydronephrosis, hydroureter, or appreciable ureteral calculus. Foley catheter is present in the urinary bladder. Stomach/Bowel: Periampullary duodenal diverticulum. Moderately redundant sigmoid colon. Normal appendix. Vascular/Lymphatic: Aortoiliac atherosclerotic vascular disease. Right external iliac node 1.2 cm in short axis on image 75/3, previously the same on 10/04/2013. Right pelvic sidewall lymph node 0.9 cm in short axis on image 83/3, previously the same on 10/04/2013. Reproductive: Uterus absent. Adnexa unremarkable. Other: No supplemental non-categorized findings. Musculoskeletal: Bilateral elastofibroma dorsi. Dorsal column stimulator noted. Severe asymmetric degenerative arthropathy left hip. Posterolateral rod and pedicle screw fixation at L2-L3-L4-L5-S1, with the left pedicle screw at L2 capturing the superior endplate of L2, and with solid interbody bony fusions at L2-3 and L4-5. There is multilevel degenerative arthropathy and the right L5 pedicle screw may capture the inferior margin of the pedicle. There is also some mild lucency around the tip of the right S1 pedicle screw. IMPRESSION: 1. A specific cause for the patient's right upper quadrant abdominal pain is not identified. 2. Bilateral nonobstructive nephrolithiasis. 3. Scarring in the right kidney leading to relative right renal atrophy. 4. Coronary atherosclerosis. 5. Borderline elevated left hemidiaphragm with adjacent left lower lobe atelectasis. 6. Borderline enlarged right external iliac lymph node, stable from 10/04/2013. 7. Severe asymmetric degenerative arthropathy left hip. 8. Postoperative findings in the lumbar spine. The right L5 pedicle screw may capture the inferior margin of the pedicle. There is also some mild lucency around the tip of the right S1 pedicle screw. 9. Bilateral  elastofibroma dorsi. Aortic Atherosclerosis (ICD10-I70.0). Electronically Signed  By: Van Clines M.D.   On: 04/02/2019 15:23   DG CHEST PORT 1 VIEW  Result Date: 04/02/2019 CLINICAL DATA:  Shortness of breath EXAM: PORTABLE CHEST 1 VIEW COMPARISON:  03/30/2019 FINDINGS: Cardiac shadow is stable. Previously seen vascular congestion has resolved. Left jugular central line is been removed in the interval. Mild left basilar atelectasis is seen. IMPRESSION: Improved vascular congestion. Persistent left basilar atelectasis is noted. Electronically Signed   By: Inez Catalina M.D.   On: 04/02/2019 09:40     LOS: 4 days   Time spent: More than 50% of that time was spent in counseling and/or coordination of care.  Antonieta Pert, MD [facility]  04/03/2019, 11:44 AM

## 2019-04-03 NOTE — Consult Note (Signed)
ORTHOPAEDIC CONSULTATION  REQUESTING PHYSICIAN: Antonieta Pert, MD  PCP:  Garvin Fila, MD  Chief Complaint: Right knee pain  HPI: Jesus Cruz is a 54 y.o. male who complains of increasing right knee pain since his recent hospitalization.  Ruefully he is admitted for coronary artery disease and intervention.  He has been placed on aspirin and Plavix as well as heparin following his procedure.  He has recently been transferred out of the ICU as of yesterday.  Since that time he was complaining of increasing pain in the right knee as well as some cellulitis down the right leg.  The cellulitis seems to be improving but due to the persistent complaints of right knee pain orthopedic surgery was consulted.    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  He has been managed with colchicine and prednisone 20 mg/day over the last couple of years.  He does state, that just last week he had an aspiration and steroid injection in the right knee due to a gouty flare.  There was no concerns for infection at that time based on his understanding from the fluid analysis.  Currently he is just complaining of increasing pain.  He was scheduled for a follow-up appointment with his orthopedic doctor in [facility] this week, but has missed that due to his current hospitalization.  He denies any numbness or tingling.  He denies smoking.  He is independent with ADLs and does not typically require any assistive devices for ambulation.  Past Medical History:  Diagnosis Date  . CVA (cerebral vascular accident) ([place])   . Gout   . Hypertension   . MGUS (monoclonal gammopathy of unknown significance)   . Obesity   . OSA on CPAP   . Pulmonary embolus ([place]) 05/2012   During recovery from MI, completed Coumadin  . STEMI (ST elevation myocardial infarction) ([place]) 04/2012   Integrity BMS RCA with minor left system disease   Past Surgical History:    Procedure Laterality Date  . CORONARY ANGIOPLASTY WITH STENT PLACEMENT  04/2012   Integrity BMS RCA, minor L system dz  . LEFT HEART CATH AND CORONARY ANGIOGRAPHY N/A 03/31/2019   Procedure: LEFT HEART CATH AND CORONARY ANGIOGRAPHY;  Surgeon: Leonie Man, MD;  Location: [facility];  Service: Cardiovascular;  Laterality: N/A;  . RADIOLOGY WITH ANESTHESIA N/A 09/16/2013   Procedure: RADIOLOGY WITH ANESTHESIA;  Surgeon: Rob Hickman, MD;  Location: [facility];  Service: Radiology;  Laterality: N/A;   Social History   Socioeconomic History  . Marital status: Single    Spouse name: Not on file  . Number of children: Not on file  . Years of education: Not on file  . Highest education level: Not on file  Occupational History  . Not on file  Tobacco Use  . Smoking status: Never Smoker  . Smokeless tobacco: Never Used  Substance and Sexual Activity  . Alcohol use: No  . Drug use: Not on file  . Sexual activity: Never  Other Topics Concern  . Not on file  Social History Narrative  . Not on file   Social Determinants of Health   Financial Resource Strain:   . Difficulty of Paying Living Expenses:   Food Insecurity:   . Worried About Charity fundraiser in the Last Year:   . Arboriculturist in the Last Year:   Transportation Needs:   . Lack  of Transportation (Medical):   Marland Kitchen Lack of Transportation (Non-Medical):   Physical Activity:   . Days of Exercise per Week:   . Minutes of Exercise per Session:   Stress:   . Feeling of Stress :   Social Connections:   . Frequency of Communication with Friends and Family:   . Frequency of Social Gatherings with Friends and Family:   . Attends Religious Services:   . Active Member of Clubs or Organizations:   . Attends Archivist Meetings:   Marland Kitchen Marital Status:    History reviewed. No pertinent family history. Allergies  Allergen Reactions  . Other Nausea And Vomiting    Mayonnaise     Prior to Admission  medications   Medication Sig Start Date End Date Taking? Authorizing Provider  allopurinol (ZYLOPRIM) 300 MG tablet Take 300 mg by mouth daily.   Yes [provider]  amLODipine (NORVASC) 10 MG tablet Take 10 mg by mouth at bedtime.    Yes [provider]  aspirin EC 81 MG tablet Take 81 mg by mouth at bedtime.   Yes [provider]  atorvastatin (LIPITOR) 10 MG tablet Take 10 mg by mouth at bedtime.  03/09/19  Yes [provider]  colchicine 0.6 MG tablet Take 0.6 mg by mouth at bedtime as needed (gout).    Yes [provider]  cyclobenzaprine (FLEXERIL) 10 MG tablet Take 10 mg by mouth 2 (two) times daily as needed. 01/22/19  Yes [provider]  doxazosin (CARDURA) 2 MG tablet Take 2 mg by mouth at bedtime. 12/24/18  Yes [provider]  furosemide (LASIX) 20 MG tablet Take 20 mg by mouth daily as needed for fluid.  03/06/19  Yes [provider]  glimepiride (AMARYL) 1 MG tablet Take 1-2 mg by mouth at bedtime.  03/07/19  Yes [provider]  HYDROcodone-acetaminophen (NORCO/VICODIN) 5-325 MG tablet Take 1 tablet by mouth 2 (two) times daily as needed for pain. 03/16/19  Yes [provider]  lisinopril (ZESTRIL) 10 MG tablet Take 10 mg by mouth daily. 12/24/18  Yes [provider]  metoprolol succinate (TOPROL-XL) 100 MG 24 hr tablet Take 300 mg by mouth daily. Take with or immediately following a meal.   Yes [provider]  morphine (MS CONTIN) 15 MG 12 hr tablet Take 15 mg by mouth 3 (three) times daily. 02/26/19  Yes [provider]  naloxone (NARCAN) nasal spray 4 mg/0.1 mL Place 1 spray into the nose once.  11/26/17  Yes [provider]  nitroGLYCERIN (NITROSTAT) 0.4 MG SL tablet Place 0.4 mg under the tongue every 5 (five) minutes as needed for chest pain.   Yes [provider]  predniSONE (DELTASONE) 10 MG tablet Take 10 mg by mouth daily as needed. 12/24/18  Yes  [provider]  pregabalin (LYRICA) 100 MG capsule Take 100 mg by mouth 3 (three) times daily.    Yes [provider]  triamcinolone cream (KENALOG) 0.5 % Apply 1 application topically 3 (three) times daily. APPLY FOR 4 WEEKS STOP FOR 1 WEEK & REPEAT AS NEEDED 03/12/19  Yes [provider]  aspirin EC 325 MG tablet TAKE 1 TABLET BY MOUTH EVERY DAY Patient not taking: Reported on 03/30/2019 11/14/13   Rosalin Hawking, MD   DG Knee 1-2 Views Right  Result Date: 04/03/2019 CLINICAL DATA:  No known injury EXAM: RIGHT KNEE - 1-2 VIEW COMPARISON:  None. FINDINGS: Generalized osteopenia. No fracture or dislocation. No aggressive  osseous lesion. Mild patellofemoral compartment and medial femorotibial compartment joint space narrowing with marginal osteophytes. Chondrocalcinosis of the medial and lateral femorotibial compartments as can be seen with CPPD. Peripheral vascular atherosclerotic disease. Soft tissue dystrophic calcifications along the anterior aspect of the knee. IMPRESSION: No acute osseous injury of the right knee. Mild osteoarthritis of the patellofemoral compartment and medial femorotibial compartment. Electronically Signed   By: Kathreen Devoid   On: 04/03/2019 11:12   DG Abd 1 View  Result Date: 04/02/2019 CLINICAL DATA:  Nausea and vomiting EXAM: ABDOMEN - 1 VIEW COMPARISON:  07/23/2016 FINDINGS: Scattered large and small bowel gas is noted. No obstructive changes are seen. Postsurgical changes in the lumbar spine are noted as well as a thoracic spinal stimulator. No other focal abnormality is seen. IMPRESSION: No acute abnormality in the abdomen. Electronically Signed   By: Inez Catalina M.D.   On: 04/02/2019 09:41   CT ABDOMEN PELVIS W CONTRAST  Result Date: 04/02/2019 CLINICAL DATA:  Right upper quadrant abdominal pain. Renal failure. Shortness of breath with difficulty swallowing. EXAM: CT ABDOMEN AND PELVIS WITH CONTRAST TECHNIQUE: Multidetector CT imaging of the  abdomen and pelvis was performed using the standard protocol following bolus administration of intravenous contrast. CONTRAST:  141mL OMNIPAQUE IOHEXOL 300 MG/ML  SOLN COMPARISON:  Renal ultrasound 03/30/2019 and CT abdomen from 01/27/2017 FINDINGS: Lower chest: There is atelectasis in the left lower lobe along with subsegmental atelectasis in the lingula. Left anterior descending, circumflex, and right coronary artery atherosclerosis. Borderline elevated left hemidiaphragm. Hepatobiliary: Unremarkable Pancreas: Unremarkable Spleen: Unremarkable Adrenals/Urinary Tract: Both adrenal glands appear normal. Scarring in the right kidney leading to relative right renal atrophy. The adrenal glands appear unremarkable. There about 7 tiny punctate nonobstructive right renal calculi measuring up to 2 mm in diameter. They were 5 nonobstructive punctate left renal calculi likewise measuring up to 2 mm in diameter. No hydronephrosis, hydroureter, or appreciable ureteral calculus. Foley catheter is present in the urinary bladder. Stomach/Bowel: Periampullary duodenal diverticulum. Moderately redundant sigmoid colon. Normal appendix. Vascular/Lymphatic: Aortoiliac atherosclerotic vascular disease. Right external iliac node 1.2 cm in short axis on image 75/3, previously the same on 10/04/2013. Right pelvic sidewall lymph node 0.9 cm in short axis on image 83/3, previously the same on 10/04/2013. Reproductive: Uterus absent. Adnexa unremarkable. Other: No supplemental non-categorized findings. Musculoskeletal: Bilateral elastofibroma dorsi. Dorsal column stimulator noted. Severe asymmetric degenerative arthropathy left hip. Posterolateral rod and pedicle screw fixation at L2-L3-L4-L5-S1, with the left pedicle screw at L2 capturing the superior endplate of L2, and with solid interbody bony fusions at L2-3 and L4-5. There is multilevel degenerative arthropathy and the right L5 pedicle screw may capture the inferior margin of the  pedicle. There is also some mild lucency around the tip of the right S1 pedicle screw. IMPRESSION: 1. A specific cause for the patient's right upper quadrant abdominal pain is not identified. 2. Bilateral nonobstructive nephrolithiasis. 3. Scarring in the right kidney leading to relative right renal atrophy. 4. Coronary atherosclerosis. 5. Borderline elevated left hemidiaphragm with adjacent left lower lobe atelectasis. 6. Borderline enlarged right external iliac lymph node, stable from 10/04/2013. 7. Severe asymmetric degenerative arthropathy left hip. 8. Postoperative findings in the lumbar spine. The right L5 pedicle screw may capture the inferior margin of the pedicle. There is also some mild lucency around the tip of the right S1 pedicle screw. 9. Bilateral elastofibroma dorsi. Aortic Atherosclerosis (ICD10-I70.0). Electronically Signed   By: Van Clines M.D.   On: 04/02/2019 15:23  DG CHEST PORT 1 VIEW  Result Date: 04/02/2019 CLINICAL DATA:  Shortness of breath EXAM: PORTABLE CHEST 1 VIEW COMPARISON:  03/30/2019 FINDINGS: Cardiac shadow is stable. Previously seen vascular congestion has resolved. Left jugular central line is been removed in the interval. Mild left basilar atelectasis is seen. IMPRESSION: Improved vascular congestion. Persistent left basilar atelectasis is noted. Electronically Signed   By: Inez Catalina M.D.   On: 04/02/2019 09:40    Positive ROS: All other systems have been reviewed and were otherwise negative with the exception of those mentioned in the HPI and as above.  Physical Exam: General: Alert, no acute distress Cardiovascular: No pedal edema Respiratory: No cyanosis, no use of accessory musculature GI: No organomegaly, abdomen is soft and non-tender Skin: No lesions in the area of chief complaint Neurologic: Sensation intact distally Psychiatric: Patient is competent for consent with normal mood and affect Lymphatic: No axillary or cervical  lymphadenopathy  MUSCULOSKELETAL:  Right knee:  He has maybe a trace effusion.  No overlying erythema at the knee but inferior aspect of the patella tendon and down the pretibial region he has some erythema consistent with cellulitis.  This seems to be improving based on the outlined markings.  In the knee itself he has some calcified nodules in the prepatellar region.  Tender on the medial lateral joint line.  He can range the knee voluntarily from 5 to 120 degrees.  He has some pain at end range of motion.  Otherwise no signs of DVT.  Distally neurovascularly intact.    Assessment: Right knee gouty arthropathy  Plan: -I reviewed his radiographs which do show some degenerative changes in all 3 compartments.  Furthermore, he has chondrocalcinosis and gouty nodules noted in the prepatellar region.  I do believe he is currently dealing with a acute flareup that probably began last week of this gouty arthropathy.  Certainly his stress of the recent hospitalization could have precipitated that as well.  -I would not recommend a repeat cortisone injection at this time as he just had one last week and he is a very poorly controlled diabetic.  I think reinitiating oral Medications is in his best interest.  If his blood sugars are reasonably managed we could also consider adding back to prednisone.  I also would recommend aggressive icing to the right knee.  He has no signs concerning for septic arthritis at this time, and also had a arthrocentesis performed last week that ruled out current septic flare.  - call with questions    Nicholes Stairs, MD Cell [phone]    04/03/2019 12:25 PM

## 2019-04-03 NOTE — Progress Notes (Signed)
      Dr Angelena Form note reviewed, medically managed PDA disease. He is on ASA, plavix 75, toprol 150mg , atorva 10. Echo with normal LVEF. Admitted with AKI on CKD and hyperkalemia, not on ACE/ARB. Low dose statin only since on fluconazole at this time. No additional cardiology recs at this time, we will sign off inpatient care.    Merrily Pew, MD  04/03/2019, 7:57 AM

## 2019-04-03 NOTE — Evaluation (Signed)
Occupational Therapy Evaluation Patient Details Name: Jesus Cruz MRN: [id number] DOB: 11/19/1965 Today's Date: 04/03/2019    History of Present Illness 54 year old male with past medical history significant for CAD, hypertension, and prior CVA, admitted with complaints of chest pain as a code STEMI found to have hyperkalemia, acute renal failure, and DKA. Pt uinderwent LHC on 3/24 with recommendation for medical management.   Clinical Impression   Pt lives with his mother who is essentially bedbound and cares for her at night while caregivers care for her during the day. Pt transfers to and from his w/c independently in his home and reports he can use crutches in the community. Pt presents with pain in R foot/knee, generalized weakness and impaired standing balance. He currently requires min guard assist to squat-pivot to chair and up to moderate assistance for ADL. He has AE at home as needed for LB ADL. Pt's goal is to continue to be able to care for his mom at home. Will follow acutely.    Follow Up Recommendations  No OT follow up    Equipment Recommendations  None recommended by OT    Recommendations for Other Services       Precautions / Restrictions Precautions Precautions: Fall Precaution Comments: R knee known to buckle Restrictions Weight Bearing Restrictions: No      Mobility Bed Mobility Overal bed mobility: Modified Independent             General bed mobility comments: increased time, HOB up  Transfers Overall transfer level: Needs assistance   Transfers: Squat Pivot Transfers     Squat pivot transfers: Min guard     General transfer comment: dropped arm down on chair for lateral transfer so pt would not have to put weight on his painful R foot, but chose to partially stand to transfer    Balance Overall balance assessment: Needs assistance   Sitting balance-Leahy Scale: Good                                     ADL either  performed or assessed with clinical judgement   ADL Overall ADL's : Needs assistance/impaired Eating/Feeding: Independent   Grooming: Set up;Sitting   Upper Body Bathing: Minimal assistance;Sitting Upper Body Bathing Details (indicate cue type and reason): pt has a device he uses to wash his back at home Lower Body Bathing: Moderate assistance;Sit to/from stand   Upper Body Dressing : Set up;Sitting   Lower Body Dressing: Moderate assistance;Sit to/from stand   Toilet Transfer: Min guard;Squat-pivot   Toileting- Water quality scientist and Hygiene: Moderate assistance;Sit to/from stand               Vision Patient Visual Report: No change from baseline       Perception     Praxis      Pertinent Vitals/Pain Pain Assessment: Faces Faces Pain Scale: Hurts little more Pain Location: R foot  Pain Descriptors / Indicators: Aching Pain Intervention(s): Monitored during session;Repositioned     Hand Dominance Right   Extremity/Trunk Assessment Upper Extremity Assessment Upper Extremity Assessment: RUE deficits/detail;LUE deficits/detail RUE Deficits / Details: WFL, gouty changes in hand LUE Deficits / Details: hx of shoulder replacement, gouty changes in hand   Lower Extremity Assessment Lower Extremity Assessment: Defer to PT evaluation   Cervical / Trunk Assessment Cervical / Trunk Assessment: Other exceptions Cervical / Trunk Exceptions: morbid obesity   Communication Communication Communication: No difficulties  Cognition Arousal/Alertness: Awake/alert Behavior During Therapy: WFL for tasks assessed/performed Overall Cognitive Status: Within Functional Limits for tasks assessed                                 General Comments: pt report increased stress related to the care of his mother   General Comments       Exercises     Shoulder Instructions      Home Living Family/patient expects to be discharged to:: Private residence Living  Arrangements: Parent(mother has caregivers) Available Help at Discharge: Other (Comment)(mother's caregivers are there 8 hours a day) Type of Home: House Home Access: Ramped entrance     [place]: One level     Bathroom Shower/Tub: Occupational psychologist: Handicapped height     Home Equipment: Wheelchair - Water quality scientist - 2 wheels;Other (comment);Grab bars - toilet;Grab bars - tub/shower;Hand held shower head;Adaptive equipment(hoyer lift) Adaptive Equipment: Reacher;Sock aid Additional Comments: pt helps care for mother at night who is bedbound      Prior Functioning/Environment Level of Independence: Independent with assistive device(s)        Comments: pt performs SPT from bed to wheelchair, utilizes wheelchair for household mobility. Pt reports being able to ambulate short distances with crutches in the community        OT Problem List: Decreased strength;Decreased activity tolerance;Impaired balance (sitting and/or standing);Obesity;Pain      OT Treatment/Interventions: Self-care/ADL training;DME and/or AE instruction;Patient/family education;Balance training    OT Goals(Current goals can be found in the care plan section) Acute Rehab OT Goals Patient Stated Goal: To improve mobility and get healthy to take care of mother OT Goal Formulation: With patient Time For Goal Achievement: 04/17/19 Potential to Achieve Goals: Good ADL Goals Pt Will Perform Lower Body Bathing: with modified independence;with adaptive equipment;sit to/from stand Pt Will Perform Lower Body Dressing: with modified independence;with adaptive equipment;sit to/from stand Pt Will Transfer to Toilet: with modified independence;ambulating;bedside commode Pt Will Perform Toileting - Clothing Manipulation and hygiene: with modified independence;sit to/from stand Pt Will Perform Tub/Shower Transfer: Shower transfer;ambulating;shower seat  OT Frequency: Min 2X/week   Barriers to  D/C:            Co-evaluation              AM-PAC OT "6 Clicks" Daily Activity     Outcome Measure Help from another person eating meals?: None Help from another person taking care of personal grooming?: A Little Help from another person toileting, which includes using toliet, bedpan, or urinal?: A Lot Help from another person bathing (including washing, rinsing, drying)?: A Lot Help from another person to put on and taking off regular upper body clothing?: A Little Help from another person to put on and taking off regular lower body clothing?: A Lot 6 Click Score: 16   End of Session Equipment Utilized During Treatment: Oxygen  Activity Tolerance: Patient tolerated treatment well Patient left: in chair;with call bell/phone within reach  OT Visit Diagnosis: Unsteadiness on feet (R26.81);Pain                Time: 1336-1400 OT Time Calculation (min): 24 min Charges:  OT General Charges $OT Visit: 1 Visit OT Evaluation $OT Eval Moderate Complexity: 1 Mod OT Treatments $Self Care/Home Management : 8-22 mins  Nestor Lewandowsky, OTR/L Acute Rehabilitation Services Pager: [phone] Office: [phone]  Malka So 04/03/2019, 3:29 PM

## 2019-04-04 ENCOUNTER — Encounter (HOSPITAL_COMMUNITY): Payer: Medicare Other

## 2019-04-04 LAB — CULTURE, BLOOD (ROUTINE X 2)
Culture: NO GROWTH
Culture: NO GROWTH
Special Requests: ADEQUATE
Special Requests: ADEQUATE

## 2019-04-04 LAB — RENAL FUNCTION PANEL
Albumin: 2.4 g/dL — ABNORMAL LOW (ref 3.5–5.0)
Anion gap: 9 (ref 5–15)
BUN: 29 mg/dL — ABNORMAL HIGH (ref 6–20)
CO2: 17 mmol/L — ABNORMAL LOW (ref 22–32)
Calcium: 8.6 mg/dL — ABNORMAL LOW (ref 8.9–10.3)
Chloride: 108 mmol/L (ref 98–111)
Creatinine, Ser: 1.28 mg/dL — ABNORMAL HIGH (ref 0.61–1.24)
GFR calc Af Amer: >60 mL/min (ref >60)
GFR calc non Af Amer: >60 mL/min (ref >60)
Glucose, Bld: 136 mg/dL — ABNORMAL HIGH (ref 70–99)
Phosphorus: 4.2 mg/dL (ref 2.5–4.6)
Potassium: 4.9 mmol/L (ref 3.5–5.1)
Sodium: 134 mmol/L — ABNORMAL LOW (ref 135–145)

## 2019-04-04 LAB — GLUCOSE, CAPILLARY
Glucose-Capillary: 138 mg/dL — ABNORMAL HIGH (ref 70–99)
Glucose-Capillary: 150 mg/dL — ABNORMAL HIGH (ref 70–99)
Glucose-Capillary: 195 mg/dL — ABNORMAL HIGH (ref 70–99)
Glucose-Capillary: 238 mg/dL — ABNORMAL HIGH (ref 70–99)
Glucose-Capillary: 278 mg/dL — ABNORMAL HIGH (ref 70–99)
Glucose-Capillary: 92 mg/dL (ref 70–99)

## 2019-04-04 LAB — MAGNESIUM: Magnesium: 1.8 mg/dL (ref 1.7–2.4)

## 2019-04-04 MED ORDER — RESOURCE THICKENUP CLEAR PO POWD
ORAL | Status: DC | PRN
  Filled 2019-04-04: qty 125

## 2019-04-04 MED ORDER — PREDNISONE 20 MG PO TABS
20.0000 mg | ORAL_TABLET | Freq: Every day | ORAL | Status: DC
  Administered 2019-04-04 – 2019-04-06 (×3): 20 mg via ORAL
  Filled 2019-04-04 (×3): qty 1

## 2019-04-04 MED ORDER — STARCH (THICKENING) PO POWD: ORAL | Status: DC | PRN

## 2019-04-04 NOTE — Evaluation (Signed)
Clinical/Bedside Swallow Evaluation Patient Details  Name: Jesus Cruz MRN: [id number] Date of Birth: 08-Sep-1965  Today's Date: 04/04/2019 Time: SLP Start Time (ACUTE ONLY): 1125 SLP Stop Time (ACUTE ONLY): 1151 SLP Time Calculation (min) (ACUTE ONLY): 26 min  Past Medical History:  Past Medical History:  Diagnosis Date  . CVA (cerebral vascular accident) ([place])   . Gout   . Hypertension   . MGUS (monoclonal gammopathy of unknown significance)   . Obesity   . OSA on CPAP   . Pulmonary embolus ([place]) 05/2012   During recovery from MI, completed Coumadin  . STEMI (ST elevation myocardial infarction) ([place]) 04/2012   Integrity BMS RCA with minor left system disease   Past Surgical History:  Past Surgical History:  Procedure Laterality Date  . CORONARY ANGIOPLASTY WITH STENT PLACEMENT  04/2012   Integrity BMS RCA, minor L system dz  . LEFT HEART CATH AND CORONARY ANGIOGRAPHY N/A 03/31/2019   Procedure: LEFT HEART CATH AND CORONARY ANGIOGRAPHY;  Surgeon: Leonie Man, MD;  Location: [facility];  Service: Cardiovascular;  Laterality: N/A;  . RADIOLOGY WITH ANESTHESIA N/A 09/16/2013   Procedure: RADIOLOGY WITH ANESTHESIA;  Surgeon: Rob Hickman, MD;  Location: [facility];  Service: Radiology;  Laterality: N/A;   HPI:  54 year old male with past medical history significant for CAD, hypertension, and prior CVA, admitted with complaints of chest pain as a code STEMI found to have hyperkalemia, acute renal failure, and DKA. Pt uinderwent [facility] on 3/24 with recommendation for medical management. Pt has had c/o liquids > solids "pooling" in his throat, therefore SLP swallow evaluation was ordered.   Assessment / Plan / Recommendation Clinical Impression  Pt describes acute onset of swallowing difficulties that began about a week prior to admission. He subjectively describes it as thin liquids "pooling" in his throat or feeling like they are "circling the drain" trying to go down. He has  also noticed an increase in his indigestion, which has started to feel better since taking protonix. He feels like he has to regurgitate at times, but when coughs it is productive only of what looks to him like "flesh."   Although several of his symptoms sound more esophageal, he also says that he has increased difficulty with liquids over solids and he describes soreness in his oral cavity that has improved since admission. I note a small, whitish spot near the tip of his tongue that he says is new. Coughing is noted mostly with liquids during PO trials, at times seemingly volitional. He describes subjective improvement in symptoms when he takes honey thick liquids via spoon and when he eats softer consistencies. Will adjust diet to Dys 2 (chopped) foods and honey thick liquids pending completion of further testing. Note that pt is scheduled for esophagram on next date. Will tentatively plan for MBS as well to assess for any oropharygneal cause to his symptoms.  SLP Visit Diagnosis: Dysphagia, unspecified (R13.10)    Aspiration Risk  Mild aspiration risk;Moderate aspiration risk    Diet Recommendation Dysphagia 2 (Fine chop);Honey-thick liquid   Liquid Administration via: Spoon Medication Administration: Crushed with puree Supervision: Patient able to self feed;Intermittent supervision to cue for compensatory strategies Compensations: Slow rate;Small sips/bites;Follow solids with liquid Postural Changes: Seated upright at 90 degrees;Remain upright for at least 30 minutes after po intake    Other  Recommendations Recommended Consults: Consider GI evaluation;Consider esophageal assessment Oral Care Recommendations: Oral care BID   Follow up Recommendations (tba)  Frequency and Duration            Prognosis Prognosis for Safe Diet Advancement: Good      Swallow Study   General HPI: 54 year old male with past medical history significant for CAD, hypertension, and prior CVA, admitted  with complaints of chest pain as a code STEMI found to have hyperkalemia, acute renal failure, and DKA. Pt uinderwent LHC on 3/24 with recommendation for medical management. Pt has had c/o liquids > solids "pooling" in his throat, therefore SLP swallow evaluation was ordered. Type of Study: Bedside Swallow Evaluation Previous Swallow Assessment: BSE in 2015 [facility]; had been on thickened liquids for about one month after a prior CVA Diet Prior to this Study: Regular;Thin liquids Temperature Spikes Noted: No Respiratory Status: Room air History of Recent Intubation: No Behavior/Cognition: Alert;Cooperative;Pleasant mood Oral Cavity Assessment: Other (comment)(small white spot near tongue tip) Oral Care Completed by SLP: No Oral Cavity - Dentition: Adequate natural dentition;Poor condition Vision: Functional for self-feeding Self-Feeding Abilities: Able to feed self Patient Positioning: Upright in chair Baseline Vocal Quality: Normal Volitional Cough: Strong Volitional Swallow: Able to elicit    Oral/Motor/Sensory Function Overall Oral Motor/Sensory Function: (tongue deviates to his R; this is his baseline)   Ice Chips Ice chips: Within functional limits Presentation: Spoon   Thin Liquid Thin Liquid: Impaired Presentation: Cup;Self Fed Pharyngeal  Phase Impairments: Cough - Immediate;Cough - Delayed    Nectar Thick Nectar Thick Liquid: Impaired Presentation: Cup;Self Fed;Spoon Pharyngeal Phase Impairments: Cough - Immediate;Cough - Delayed   Honey Thick Honey Thick Liquid: Impaired Presentation: Cup;Self fed;Spoon Pharyngeal Phase Impairments: Cough - Delayed   Puree Puree: Within functional limits Presentation: Spoon;Self Fed   Solid     Solid: Impaired Presentation: Self Fed Pharyngeal Phase Impairments: Cough - Delayed;Multiple swallows       Osie Bond., M.A. [place] Pager [phone] Office [phone]  04/04/2019,1:09 PM

## 2019-04-04 NOTE — Progress Notes (Signed)
PROGRESS NOTE    Jesus Cruz  IHK:[id number] DOB: Nov 09, 1965 DOA: 03/30/2019 PCP: Garvin Fila, MD   Brief Narrative: 55 year old gentleman with complex comorbidities including CAD ST elevation MI in 2014, hypertension, PE, OSA on CPAP, obesity, hypertension, history of stroke, diabetes mellitus presented via EMS with complaint of chest pain, code STEMI-with complaint of shortness of breath, diaphoresis on arrival, seen by cardiology found to have renal failure hyperkalemia, DKA-was admitted to ICU and managed by PCCM/nephrology/cardiology and underwent cardiac cath 3/24 for inferior ST EMI-occlusion of the PDA other vessels were patent Echo with EF 55 to 60% and continued on medical management aspirin Plavix, heparin. Patient is managed for hyperglycemia, hyperkalemia, MI, volume overload, plan was to start CRRT but he responded well to Lasix.  Patient was stabilized and transferred to [facility] admit 3/26.  Subjective:  Right knee pain some better but complains of difficulty with swallowing-with sensation of food in the throat. Overnight nonsustained V. tach.  Blood pressure fluctuating.   Assessment & Plan:  CAD/inferior ST elevation MI: Status post cardiac cath 3/24,occlusion of the PDA other vessels were patent Echo with EF 55 to 60% and continued on medical management aspirin Plavix, metoprolol 150 mg.  Continue plan as per cardiology.  Continue low-dose statin due to Diflucan  Acute renal failure and CKD/hyperkalemia-reported baseline CKD stage III (unclear type a or b): Nephrology on board signed out 3/25, urine output 3485 past 24 hours,creatinine improved and potassium has improved with Lokelma.HD catheter got pulled out 3/26.Monitor renal function. Recent Labs  Lab 04/01/19 0305 04/02/19 0355 04/02/19 1842 04/03/19 0358 04/04/19 0542  BUN 66* 45* 38* 30* 29*  CREATININE 1.64* 1.35* 1.25* 1.22 1.28*   Sensation of food stuck in the throat: Deferring EGD at this time due to  his MI.  Will discuss with GI. obtain a speech eval. Dr. Penelope Coop advised to order barium swallow for tomorrow. Abdominal discomfort pain/history of ulcer- post prandial abdominal discomfort for few weeks- Cont ppi q12 hr, diflucan for thrush.Reports hx of ulcer in 2016- used to see Dr Thana Farr.  Discussed Dr.  Oswald Hillock input continue current management- CT abdomen pelvis no acute finding-no EGD given his MI.  Acute hypoxic respiratory failure secondary to volume overload: On nasal cannula oxygen. Continue supplemental oxygen and wean as tolerated.Chest x-ray-no acute abnormalities, on 2l [place]- continue to wean off.  DM with HHS -HHS resolved: Poorly controlled hemoglobin A1c 12.0 (03/2319),blood sugar overall is stable on Lantus 10 units twice daily and sliding scale insulin.  Diabetic cardiology on board recommending NovoLog 4 u, 3 times daily Premeal if more than 50% intake. Monitor and adjust. Recent Labs  Lab 04/03/19 1653 04/03/19 2042 04/04/19 0002 04/04/19 0321 04/04/19 0746  GLUCAP 218* 296* 150* 92 138*   Essential hypertension:stable.  Continue amlodipine, metoprolol.  History of PE-DVT ultrasound negative, less likely for PE. off heparin drip for his MI, does not appears to be on anticoagulation prior to admission.  Continue nasal cannula continue to wean off oxygen as tolerated  LE wounds/wounds and- cont wound care  Rt knee pain suspecting gouty arthropathy pulses are dopplerable, patient is on chronic morphine at home and colchicine.Continue morphine,prednisone.X-ray shows degenerative changes, appreciate orthopedic input and does not advise injection with steroids at this time as he had one recently.  Leukocytosis: 28.6 on admission but afebrile-? Reactive versus possible LLE cellulitis.  Procalcitonin 0.4 subsequently 0.4 and antibiotic cefepime was discontinued in ICU.  Monitor. Recent Labs  Lab 03/30/19 1451 03/31/19 0222 04/01/19  1324 04/02/19 0355 04/03/19 0358    WBC 28.6* 33.8* 24.7* 17.6* 18.2*   Plaque in posterior pharynx : Started on Diflucan empirically for thrush 3/26.    Deconditioning/debility : PT OT eval and ambulation  Morbid obesity BMI 49.3 he will benefit with weight loss.  Bilateral Nonobstructive nephrolithiasis seen in the CT scan follow-up with urology. 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 Postop finding in the lumbar spine  On chronic pain medication with opiates -Lyrica morphine and Norco.   Nutrition: Diet Order            DIET SOFT Room service appropriate? Yes; Fluid consistency: Thin  Diet effective now              Pressure Ulcer: Pressure Injury 03/31/19 Heel Right;Medial Stage 2 -  Partial thickness loss of dermis presenting as a shallow open injury with a red, pink wound bed without slough. (Active)  03/31/19 1104  Location: Heel  Location Orientation: Right;Medial  Staging: Stage 2 -  Partial thickness loss of dermis presenting as a shallow open injury with a red, pink wound bed without slough.  Wound Description (Comments):   Present on Admission: Yes    DVT prophylaxis:Heparin Code Status: FULL Family Communication: plan of care discussed with patient at bedside. Disposition Plan: Patient is from:HOME Anticipated Disposition: to home health PT  Barriers to discharge or conditions that needs to be met prior to discharge: Patient admitted with ST elevation MI renal failure hypoglycemia in ICU transferred to medical floor on 3/26 mains hospitalized with deconditioning ongoing right leg knee pain, swallowing issues abdominal pain, plan for barium swallow tomorrow.  Consultants:see note  Procedures:  Left IJ HD cath 3/23- got pulled out 3/26  Echo 03/31/19: 1. Small area of inferior basal hypokinesis. Left ventricular ejection  fraction, by estimation, is 55 to 60%. The left ventricle has normal  function. Left ventricular endocardial border not optimally  defined to  evaluate regional wall motion. There is  mild left ventricular hypertrophy. Left ventricular diastolic parameters  were normal.  2. Right ventricular systolic function is normal. The right ventricular  size is normal.  3. The mitral valve was not well visualized. No evidence of mitral valve  regurgitation. No evidence of mitral stenosis.  4. The aortic valve was not well visualized. Aortic valve regurgitation  is not visualized. No aortic stenosis is present.  5. The inferior vena cava is normal in size with greater than 50%  respiratory variability, suggesting right atrial pressure of 3 mmHg.   3/24 B/L DVT US >> RIGHT:  - There is no evidence of deep vein thrombosis in the lower extremity.  However, portions of this examination were limited- see technologist  comments above.  - No cystic structure found in the popliteal fossa.  - Poorly visualized calf veins.    LEFT:  - There is no evidence of deep vein thrombosis in the lower extremity.  However, portions of this examination were limited- see technologist  comments above.  - No cystic structure found in the popliteal fossa.  - Poorly visualized calf veins.   3/24 LHC >>  LIKELY CULPRIT LESION: Mid PDA (small caliber) 100% -> not good PCI target.  Prox RCA lesion is 30% stenosed.  Previously placed Prox RCA to Mid RCA stent (unknown type) is widely patent.  Mid RCA lesion is 60% stenosed.  1st Diag/Ramus lesion is 40% stenosed.  Ramus/OM lesion is 50% stenosed.  1st Mrg/Distal  LCx lesion is 60% stenosed.  LV end diastolic pressure is normal.  Microbiology:see note   Medications: Scheduled Meds: . amLODipine  10 mg Oral Daily  . aspirin EC  81 mg Oral Daily  . atorvastatin  10 mg Oral q1800  . Chlorhexidine Gluconate Cloth  6 each Topical Q0600  . clopidogrel  75 mg Oral Daily  . colchicine  0.6 mg Oral BID  . insulin aspart  0-20 Units Subcutaneous Q4H  . insulin detemir  10 Units  Subcutaneous BID  . metoprolol succinate  150 mg Oral Daily  . morphine  15 mg Oral TID  . pantoprazole (PROTONIX) IV  40 mg Intravenous Q12H  . predniSONE  20 mg Oral Q breakfast  . pregabalin  100 mg Oral TID  . sodium chloride flush  3 mL Intravenous Q12H  . sodium chloride flush  3 mL Intravenous Q12H   Continuous Infusions: . sodium chloride    . fluconazole (DIFLUCAN) IV 200 mg (04/03/19 1629)    Antimicrobials: Anti-infectives (From admission, onward)   Start     Dose/Rate Route Frequency Ordered Stop   04/02/19 1400  fluconazole (DIFLUCAN) IVPB 200 mg     200 mg 100 mL/hr over 60 Minutes Intravenous Every 24 hours 04/02/19 1331 04/07/19 1359   04/01/19 1000  ceFEPIme (MAXIPIME) 2 g in sodium chloride 0.9 % 100 mL IVPB  Status:  Discontinued     2 g 200 mL/hr over 30 Minutes Intravenous Every 8 hours 04/01/19 0850 04/01/19 1156   03/31/19 1700  vancomycin (VANCOREADY) IVPB 1500 mg/300 mL  Status:  Discontinued     1,500 mg 150 mL/hr over 120 Minutes Intravenous Every 24 hours 03/30/19 1619 03/31/19 1012   03/31/19 1015  vancomycin (VANCOCIN) 2,500 mg in sodium chloride 0.9 % 500 mL IVPB  Status:  Discontinued     2,500 mg 250 mL/hr over 120 Minutes Intravenous  Once 03/31/19 1012 03/31/19 1225   03/30/19 1630  vancomycin (VANCOCIN) 2,500 mg in sodium chloride 0.9 % 500 mL IVPB  Status:  Discontinued     2,500 mg 250 mL/hr over 120 Minutes Intravenous  Once 03/30/19 1619 03/31/19 1012   03/30/19 1630  ceFEPIme (MAXIPIME) 2 g in sodium chloride 0.9 % 100 mL IVPB  Status:  Discontinued     2 g 200 mL/hr over 30 Minutes Intravenous Every 12 hours 03/30/19 1619 04/01/19 0850   03/30/19 1615  ceFEPIme (MAXIPIME) 2 g in sodium chloride 0.9 % 100 mL IVPB  Status:  Discontinued     2 g 200 mL/hr over 30 Minutes Intravenous  Once 03/30/19 1608 03/30/19 1619   03/30/19 1615  metroNIDAZOLE (FLAGYL) IVPB 500 mg     500 mg 100 mL/hr over 60 Minutes Intravenous  Once 03/30/19 1608  03/30/19 1837   03/30/19 1615  vancomycin (VANCOCIN) IVPB 1000 mg/200 mL premix  Status:  Discontinued     1,000 mg 200 mL/hr over 60 Minutes Intravenous  Once 03/30/19 1608 03/30/19 1618       Objective: Vitals: Today's Vitals   04/03/19 2117 04/04/19 0456 04/04/19 0853 04/04/19 0941  BP:  136/74    Pulse:  75    Resp:  18    Temp:  97.7 F (36.5 C)    TempSrc:  Oral    SpO2:  99%    Weight:  (!) 137.9 kg    Height:      PainSc: Asleep  8  7     Intake/Output Summary (  Last 24 hours) at 04/04/2019 1143 Last data filed at 04/04/2019 1000 Gross per 24 hour  Intake 366 ml  Output 2275 ml  Net -1909 ml   Filed Weights   04/01/19 0500 04/03/19 0448 04/04/19 0456  Weight: (!) 142.9 kg (!) 137.5 kg (!) 137.9 kg   Weight change: 0.4 kg   Intake/Output from previous day: 03/27 0701 - 03/28 0700 In: 363 [P.O.:360; I.V.:3] Out: 1925 [Urine:1925] Intake/Output this shift: Total I/O In: 3 [I.V.:3] Out: 350 [Urine:350]  Examination: General exam: Alert awake oriented not in acute distress.  Obese., HEENT:Oral mucosa moist, Ear/Nose WNL grossly,dentition normal. Respiratory system: bilaterally clear,no wheezing or crackles,no use of accessory muscle, non tender. Cardiovascular system:S1 & S2 +,regular,No JVD. Gastrointestinal system:Abdomen soft, obese, mild tenderness nonspecific  Nervous System:Alert, awake, moving extremities and grossly non-focal. Extremities:Rt knee tender- erythema on RLE with hyperpigmentation discoloration more than the left leg, deformities presents on upper extremities and joint (from gout per patient).   Skin:No rashes,no icterus. ZOX:WRUEAV muscle bulk,tone,power.  Data Reviewed: I have personally reviewed following labs and imaging studies CBC: Recent Labs  Lab 03/30/19 1451 03/30/19 1456 03/30/19 1824 03/31/19 0222 04/01/19 0305 04/02/19 0355 04/03/19 0358  WBC 28.6*  --   --  33.8* 24.7* 17.6* 18.2*  NEUTROABS 25.5*  --   --   --    --   --   --   HGB 15.2   < > 15.6 14.8 13.8 14.2 13.8  HCT 45.7   < > 46.0 42.5 41.5 44.2 42.4  MCV 91.4  --   --  88.9 92.6 94.6 94.0  PLT 454*  --   --  364 275 265 289   < > = values in this interval not displayed.   Basic Metabolic Panel: Recent Labs  Lab 03/31/19 0222 03/31/19 0411 03/31/19 1604 03/31/19 2306 04/01/19 0305 04/02/19 0355 04/02/19 1842 04/03/19 0358 04/04/19 0542  NA  --    < > 136   < > 135 139 134* 136 134*  K  --    < > 5.8*   < > 5.1 5.6* 5.3* 5.1 4.9  CL  --    < > 102   < > 103 105 104 107 108  CO2  --    < > 21*   < > '22 25 22 ' 19* 17*  GLUCOSE  --    < > 154*   < > 151* 96 138* 92 136*  BUN  --    < > 81*   < > 66* 45* 38* 30* 29*  CREATININE  --    < > 2.06*   < > 1.64* 1.35* 1.25* 1.22 1.28*  CALCIUM  --    < > 8.7*   < > 8.5* 8.9 8.6* 8.9 8.6*  MG 2.4  --   --   --  2.3 2.2  --  2.0 1.8  PHOS 4.3   < > 3.7  --  3.3 3.5  --  3.4 4.2   < > = values in this interval not displayed.   GFR: Estimated Creatinine Clearance: 88.5 mL/min (A) (by C-G formula based on SCr of 1.28 mg/dL (H)). Liver Function Tests: Recent Labs  Lab 03/30/19 1451 03/31/19 0411 03/31/19 1604 04/01/19 0305 04/02/19 0355 04/03/19 0358 04/04/19 0542  AST 40  --   --   --  33  --   --   ALT 37  --   --   --  32  --   --  ALKPHOS 224*  --   --   --  105  --   --   BILITOT 0.9  --   --   --  1.0  --   --   PROT 7.4  --   --   --  7.0  --   --   ALBUMIN 3.0*   < > 2.6* 2.3* 2.6*  2.5* 2.6* 2.4*   < > = values in this interval not displayed.   Recent Labs  Lab 03/30/19 1451 04/02/19 0355  LIPASE 40 55*   Recent Labs  Lab 03/30/19 1512  AMMONIA 31   Coagulation Profile: No results for input(s): INR, PROTIME in the last 168 hours. Cardiac Enzymes: No results for input(s): CKTOTAL, CKMB, CKMBINDEX, TROPONINI in the last 168 hours. BNP (last 3 results) No results for input(s): PROBNP in the last 8760 hours. HbA1C: No results for input(s): HGBA1C in the last 72  hours. CBG: Recent Labs  Lab 04/03/19 1653 04/03/19 2042 04/04/19 0002 04/04/19 0321 04/04/19 0746  GLUCAP 218* 296* 150* 92 138*   Lipid Profile: No results for input(s): CHOL, HDL, LDLCALC, TRIG, CHOLHDL, LDLDIRECT in the last 72 hours. Thyroid Function Tests: No results for input(s): TSH, T4TOTAL, FREET4, T3FREE, THYROIDAB in the last 72 hours. Anemia Panel: No results for input(s): VITAMINB12, FOLATE, FERRITIN, TIBC, IRON, RETICCTPCT in the last 72 hours. Sepsis Labs: Recent Labs  Lab 03/30/19 1456 03/30/19 1813 03/31/19 0222 04/01/19 0305 04/02/19 0355  PROCALCITON  --  0.46 0.53 0.41 0.29  LATICACIDVEN 3.4* 2.8*  --   --   --     Recent Results (from the past 240 hour(s))  Respiratory Panel by RT PCR (Flu A&B, Covid) - Nasopharyngeal Swab     Status: None   Collection Time: 03/30/19  2:39 PM   Specimen: Nasopharyngeal Swab  Result Value Ref Range Status   SARS Coronavirus 2 by RT PCR NEGATIVE NEGATIVE Final    Comment: (NOTE) SARS-CoV-2 target nucleic acids are NOT DETECTED. The SARS-CoV-2 RNA is generally detectable in upper respiratoy specimens during the acute phase of infection. The lowest concentration of SARS-CoV-2 viral copies this assay can detect is 131 copies/mL. A negative result does not preclude SARS-Cov-2 infection and should not be used as the sole basis for treatment or other patient management decisions. A negative result may occur with  improper specimen collection/handling, submission of specimen other than nasopharyngeal swab, presence of viral mutation(s) within the areas targeted by this assay, and inadequate number of viral copies (<131 copies/mL). A negative result must be combined with clinical observations, patient history, and epidemiological information. The expected result is Negative. Fact Sheet for Patients:  PinkCheek.be Fact Sheet for Healthcare Providers:   GravelBags.it This test is not yet ap proved or cleared by the [place] FDA and  has been authorized for detection and/or diagnosis of SARS-CoV-2 by FDA under an Emergency Use Authorization (EUA). This EUA will remain  in effect (meaning this test can be used) for the duration of the COVID-19 declaration under Section 564(b)(1) of the Act, 21 U.S.C. section 360bbb-3(b)(1), unless the authorization is terminated or revoked sooner.    Influenza A by PCR NEGATIVE NEGATIVE Final   Influenza B by PCR NEGATIVE NEGATIVE Final    Comment: (NOTE) The Xpert Xpress SARS-CoV-2/FLU/RSV assay is intended as an aid in  the diagnosis of influenza from Nasopharyngeal swab specimens and  should not be used as a sole basis for treatment. Nasal washings and  aspirates  are unacceptable for Xpert Xpress SARS-CoV-2/FLU/RSV  testing. Fact Sheet for Patients: PinkCheek.be Fact Sheet for Healthcare Providers: GravelBags.it This test is not yet approved or cleared by the [place] FDA and  has been authorized for detection and/or diagnosis of SARS-CoV-2 by  FDA under an Emergency Use Authorization (EUA). This EUA will remain  in effect (meaning this test can be used) for the duration of the  Covid-19 declaration under Section 564(b)(1) of the Act, 21  U.S.C. section 360bbb-3(b)(1), unless the authorization is  terminated or revoked. Performed at [facility], [place] [address], [place], [place] [address]   Culture, blood (routine x 2)     Status: None   Collection Time: 03/30/19  2:50 PM   Specimen: BLOOD  Result Value Ref Range Status   Specimen Description BLOOD RIGHT ANTECUBITAL  Final   Special Requests   Final    BOTTLES DRAWN AEROBIC AND ANAEROBIC Blood Culture adequate volume   Culture   Final    NO GROWTH 5 DAYS Performed at [facility], [place] [address], [place], [place] [address]    Report  Status 04/04/2019 FINAL  Final  Culture, blood (routine x 2)     Status: None   Collection Time: 03/30/19  4:47 PM   Specimen: BLOOD LEFT FOREARM  Result Value Ref Range Status   Specimen Description BLOOD LEFT FOREARM  Final   Special Requests   Final    BOTTLES DRAWN AEROBIC AND ANAEROBIC Blood Culture adequate volume   Culture   Final    NO GROWTH 5 DAYS Performed at [facility], [place] [address], [place], [place] [address]    Report Status 04/04/2019 FINAL  Final  MRSA PCR Screening     Status: Abnormal   Collection Time: 03/30/19  4:54 PM   Specimen: Nasopharyngeal  Result Value Ref Range Status   MRSA by PCR POSITIVE (A) NEGATIVE Final    Comment:        The GeneXpert MRSA Assay (FDA approved for NASAL specimens only), is one component of a comprehensive MRSA colonization surveillance program. It is not intended to diagnose MRSA infection nor to guide or monitor treatment for MRSA infections. RESULT CALLED TO, READ BACK BY AND VERIFIED WITH: R ROBERTS RN 03/30/19 1853 JDW Performed at [facility], [place] [address] [place], [place] [address]       Radiology Studies: DG Knee 1-2 Views Right  Result Date: 04/03/2019 CLINICAL DATA:  No known injury EXAM: RIGHT KNEE - 1-2 VIEW COMPARISON:  None. FINDINGS: Generalized osteopenia. No fracture or dislocation. No aggressive osseous lesion. Mild patellofemoral compartment and medial femorotibial compartment joint space narrowing with marginal osteophytes. Chondrocalcinosis of the medial and lateral femorotibial compartments as can be seen with CPPD. Peripheral vascular atherosclerotic disease. Soft tissue dystrophic calcifications along the anterior aspect of the knee. IMPRESSION: No acute osseous injury of the right knee. Mild osteoarthritis of the patellofemoral compartment and medial femorotibial compartment. Electronically Signed   By: Kathreen Devoid   On: 04/03/2019 11:12   CT ABDOMEN PELVIS W CONTRAST  Result Date:  04/02/2019 CLINICAL DATA:  Right upper quadrant abdominal pain. Renal failure. Shortness of breath with difficulty swallowing. EXAM: CT ABDOMEN AND PELVIS WITH CONTRAST TECHNIQUE: Multidetector CT imaging of the abdomen and pelvis was performed using the standard protocol following bolus administration of intravenous contrast. CONTRAST:  137m OMNIPAQUE IOHEXOL 300 MG/ML  SOLN COMPARISON:  Renal ultrasound 03/30/2019 and CT abdomen from 01/27/2017  FINDINGS: Lower chest: There is atelectasis in the left lower lobe along with subsegmental atelectasis in the lingula. Left anterior descending, circumflex, and right coronary artery atherosclerosis. Borderline elevated left hemidiaphragm. Hepatobiliary: Unremarkable Pancreas: Unremarkable Spleen: Unremarkable Adrenals/Urinary Tract: Both adrenal glands appear normal. Scarring in the right kidney leading to relative right renal atrophy. The adrenal glands appear unremarkable. There about 7 tiny punctate nonobstructive right renal calculi measuring up to 2 mm in diameter. They were 5 nonobstructive punctate left renal calculi likewise measuring up to 2 mm in diameter. No hydronephrosis, hydroureter, or appreciable ureteral calculus. Foley catheter is present in the urinary bladder. Stomach/Bowel: Periampullary duodenal diverticulum. Moderately redundant sigmoid colon. Normal appendix. Vascular/Lymphatic: Aortoiliac atherosclerotic vascular disease. Right external iliac node 1.2 cm in short axis on image 75/3, previously the same on 10/04/2013. Right pelvic sidewall lymph node 0.9 cm in short axis on image 83/3, previously the same on 10/04/2013. Reproductive: Uterus absent. Adnexa unremarkable. Other: No supplemental non-categorized findings. Musculoskeletal: Bilateral elastofibroma dorsi. Dorsal column stimulator noted. Severe asymmetric degenerative arthropathy left hip. Posterolateral rod and pedicle screw fixation at L2-L3-L4-L5-S1, with the left pedicle screw at L2  capturing the superior endplate of L2, and with solid interbody bony fusions at L2-3 and L4-5. There is multilevel degenerative arthropathy and the right L5 pedicle screw may capture the inferior margin of the pedicle. There is also some mild lucency around the tip of the right S1 pedicle screw. IMPRESSION: 1. A specific cause for the patient's right upper quadrant abdominal pain is not identified. 2. Bilateral nonobstructive nephrolithiasis. 3. Scarring in the right kidney leading to relative right renal atrophy. 4. Coronary atherosclerosis. 5. Borderline elevated left hemidiaphragm with adjacent left lower lobe atelectasis. 6. Borderline enlarged right external iliac lymph node, stable from 10/04/2013. 7. Severe asymmetric degenerative arthropathy left hip. 8. Postoperative findings in the lumbar spine. The right L5 pedicle screw may capture the inferior margin of the pedicle. There is also some mild lucency around the tip of the right S1 pedicle screw. 9. Bilateral elastofibroma dorsi. Aortic Atherosclerosis (ICD10-I70.0). Electronically Signed   By: Van Clines M.D.   On: 04/02/2019 15:23     LOS: 5 days   Time spent: More than 50% of that time was spent in counseling and/or coordination of care.  Antonieta Pert, MD [facility]  04/04/2019, 11:43 AM

## 2019-04-04 NOTE — Progress Notes (Signed)
The patient's main complaint today from a GI standpoint is he feels food sticking in the neck area.  We will order a barium swallow to evaluate.

## 2019-04-04 NOTE — Progress Notes (Signed)
Physical Therapy Treatment Patient Details Name: Jesus Cruz MRN: [id number] DOB: 1965-07-12 Today's Date: 04/04/2019    History of Present Illness 54 year old male with past medical history significant for CAD, hypertension, and prior CVA, admitted with complaints of chest pain as a code STEMI found to have hyperkalemia, acute renal failure, and DKA. Pt uinderwent LHC on 3/24 with recommendation for medical management.    PT Comments    Pt received in bed, agreeable to participation in therapy. He demonstrated modified independence bed mobility, with elevated HOB and bed rail. He required min guard assist sit to stand, and min guard assist stand pivot transfer bed to recliner. Mobility limited by R knee pain. Pt on 2L O2 during session with SpO2 93-96%. Pt in recliner with feet elevated at end of session.    Follow Up Recommendations  Home health PT;Supervision for mobility/OOB     Equipment Recommendations  None recommended by PT    Recommendations for Other Services       Precautions / Restrictions Precautions Precautions: Fall Precaution Comments: R knee known to buckle Restrictions Weight Bearing Restrictions: No    Mobility  Bed Mobility Overal bed mobility: Modified Independent             General bed mobility comments: increased time, HOB up, heavy use of rail  Transfers Overall transfer level: Needs assistance   Transfers: Stand Pivot Transfers;Sit to/from Stand Sit to Stand: Min guard   Squat pivot transfers: Min guard     General transfer comment: increased time, min guard for safety  Ambulation/Gait             General Gait Details: unable to progress gait due to R knee pain   Stairs             Wheelchair Mobility    Modified Rankin (Stroke Patients Only)       Balance Overall balance assessment: Needs assistance Sitting-balance support: No upper extremity supported;Feet supported Sitting balance-Leahy Scale: Good Sitting  balance - Comments: supervision   Standing balance support: During functional activity;Single extremity supported Standing balance-Leahy Scale: Fair                              Cognition Arousal/Alertness: Awake/alert Behavior During Therapy: WFL for tasks assessed/performed Overall Cognitive Status: Within Functional Limits for tasks assessed                                        Exercises      General Comments General comments (skin integrity, edema, etc.): Pt on 2L O2 with SpO2 93-96%.      Pertinent Vitals/Pain Pain Assessment: Faces Faces Pain Scale: Hurts even more Pain Location: R knee (gout flare up) Pain Descriptors / Indicators: Grimacing;Guarding;Sore Pain Intervention(s): Limited activity within patient's tolerance;Repositioned;Monitored during session    Home Living                      Prior Function            PT Goals (current goals can now be found in the care plan section) Acute Rehab PT Goals Patient Stated Goal: move better, "do more for myself" Progress towards PT goals: Progressing toward goals    Frequency    Min 3X/week      PT Plan Current plan remains appropriate  Co-evaluation              AM-PAC PT "6 Clicks" Mobility   Outcome Measure  Help needed turning from your back to your side while in a flat bed without using bedrails?: None Help needed moving from lying on your back to sitting on the side of a flat bed without using bedrails?: A Little Help needed moving to and from a bed to a chair (including a wheelchair)?: A Little Help needed standing up from a chair using your arms (e.g., wheelchair or bedside chair)?: A Little Help needed to walk in hospital room?: A Lot Help needed climbing 3-5 steps with a railing? : Total 6 Click Score: 16    End of Session Equipment Utilized During Treatment: Oxygen Activity Tolerance: Patient limited by pain Patient left: in chair;with call  bell/phone within reach Nurse Communication: Mobility status PT Visit Diagnosis: Other abnormalities of gait and mobility (R26.89);Muscle weakness (generalized) (M62.81);Pain Pain - Right/Left: Right Pain - part of body: Knee     Time: [id number] PT Time Calculation (min) (ACUTE ONLY): 12 min  Charges:  $Therapeutic Activity: 8-22 mins                     Lorrin Goodell, PT  Office # [phone] Pager [phone]    Jesus Cruz 04/04/2019, 11:51 AM

## 2019-04-05 ENCOUNTER — Inpatient Hospital Stay (HOSPITAL_COMMUNITY): Payer: Medicare Other

## 2019-04-05 LAB — CBC
HCT: 38.7 % — ABNORMAL LOW (ref 39.0–52.0)
Hemoglobin: 12.6 g/dL — ABNORMAL LOW (ref 13.0–17.0)
MCH: 30.7 pg (ref 26.0–34.0)
MCHC: 32.6 g/dL (ref 30.0–36.0)
MCV: 94.4 fL (ref 80.0–100.0)
Platelets: 316 10*3/uL (ref 150–400)
RBC: 4.1 MIL/uL — ABNORMAL LOW (ref 4.22–5.81)
RDW: 12.7 % (ref 11.5–15.5)
WBC: 19.7 10*3/uL — ABNORMAL HIGH (ref 4.0–10.5)
nRBC: 0 % (ref 0.0–0.2)

## 2019-04-05 LAB — RENAL FUNCTION PANEL
Albumin: 2.5 g/dL — ABNORMAL LOW (ref 3.5–5.0)
Anion gap: 11 (ref 5–15)
BUN: 32 mg/dL — ABNORMAL HIGH (ref 6–20)
CO2: 16 mmol/L — ABNORMAL LOW (ref 22–32)
Calcium: 8.5 mg/dL — ABNORMAL LOW (ref 8.9–10.3)
Chloride: 107 mmol/L (ref 98–111)
Creatinine, Ser: 1.37 mg/dL — ABNORMAL HIGH (ref 0.61–1.24)
GFR calc Af Amer: >60 mL/min (ref >60)
GFR calc non Af Amer: 58 mL/min — ABNORMAL LOW (ref >60)
Glucose, Bld: 259 mg/dL — ABNORMAL HIGH (ref 70–99)
Phosphorus: 3.9 mg/dL (ref 2.5–4.6)
Potassium: 4.7 mmol/L (ref 3.5–5.1)
Sodium: 134 mmol/L — ABNORMAL LOW (ref 135–145)

## 2019-04-05 LAB — GLUCOSE, CAPILLARY
Glucose-Capillary: 157 mg/dL — ABNORMAL HIGH (ref 70–99)
Glucose-Capillary: 169 mg/dL — ABNORMAL HIGH (ref 70–99)
Glucose-Capillary: 171 mg/dL — ABNORMAL HIGH (ref 70–99)
Glucose-Capillary: 193 mg/dL — ABNORMAL HIGH (ref 70–99)
Glucose-Capillary: 279 mg/dL — ABNORMAL HIGH (ref 70–99)
Glucose-Capillary: 308 mg/dL — ABNORMAL HIGH (ref 70–99)
Glucose-Capillary: 333 mg/dL — ABNORMAL HIGH (ref 70–99)

## 2019-04-05 LAB — MAGNESIUM: Magnesium: 1.8 mg/dL (ref 1.7–2.4)

## 2019-04-05 MED ORDER — METOCLOPRAMIDE HCL 5 MG PO TABS
5.0000 mg | ORAL_TABLET | Freq: Three times a day (TID) | ORAL | Status: DC
  Administered 2019-04-05 – 2019-04-07 (×7): 5 mg via ORAL
  Filled 2019-04-05 (×7): qty 1

## 2019-04-05 NOTE — Progress Notes (Signed)
Modified Barium Swallow Progress Note  Patient Details  Name: Jesus Cruz MRN: [id number] Date of Birth: December 03, 1965  Today's Date: 04/05/2019  Modified Barium Swallow completed.  Full report located under Chart Review in the Imaging Section.  Brief recommendations include the following:  Clinical Impression  Pt demonstrates a moderate pharyngeal dysphagia with an anatomical variant that may be impacting epiglottic deflection and laryngeal closure. Pt is noted to have bilateral projections between the epiglottis and arytenoids, suspected to be protrusions of the superior horn of the thyroid cartilage. Barium travels around these structures, however, pt has no deflection of the epiglottis despite othewise adequate movement of hyoid excursion and laryngeal elevation. Base of tongue retarction could be better, but is not the primary deficit. Lack of epiglottic deflection causes consistent high penetration of liquids and impacts clearance of solids with mild to moderate pharyngeal residue. Pt is able to protect his airway with strong vestibular closure, but occasionally needs a cough or throat clear to fully eject penetrate post swallow. Pt needed cueing to learn a throat clear, which can be more efficient in clearing the vestibule than a cough.  A chin tuck is beneficial to decrease penetration. Thickened liquids were also penetrated so there is minimal benefit in thickening unless pt feels increased comfort. Will f/u with direct imaging of the larynx via FEES to further visualize pts anatomy and impact on swallow function.    Swallow Evaluation Recommendations   Recommended Consults: Consider ENT evaluation   SLP Diet Recommendations: Dysphagia 3 (Mech soft) solids;Thin liquid   Liquid Administration via: Straw   Medication Administration: Whole meds with puree   Supervision: Patient able to self feed   Compensations: Clear throat after each swallow;Follow solids with liquid;Chin tuck;Use straw  to facilitate chin tuck   Postural Changes: Seated upright at 90 degrees   Oral Care Recommendations: Oral care BID       Herbie [place], MA [place] Pager [phone] Office [phone]  Lynann Beaver 04/05/2019,11:33 AM

## 2019-04-05 NOTE — Progress Notes (Signed)
Inpatient Diabetes Program Recommendations  AACE/ADA: New Consensus Statement on Inpatient Glycemic Control   Target Ranges:  Prepandial:   less than 140 mg/dL      Peak postprandial:   less than 180 mg/dL (1-2 hours)      Critically ill patients:  140 - 180 mg/dL   Results for AMANDEEP, JONAK (MRN [id number]) as of 04/05/2019 11:03  Ref. Range 04/04/2019 07:46 04/04/2019 12:07 04/04/2019 16:55 04/04/2019 20:06 04/05/2019 00:24 04/05/2019 03:40 04/05/2019 07:53  Glucose-Capillary Latest Ref Range: 70 - 99 mg/dL 138 (H)  Novolog 3 units  Levemir 10 units 195 (H)  Novolog 4 units 278 (H)  Novolog 11 units 238 (H)  Novolog 7 units  Levemir 10 units 171 (H) 279 (H)  Novolog 11 units 169 (H)  Novolog 4 units   Review of Glycemic Control  Diabetes history: DM2 Outpatient Diabetes medications: Amaryl 1-2 mg QHS Current orders for Inpatient glycemic control: Levemir 10 units BID, Novolog 0-20 units Q4H; Prednisone 20 mg QAM  Inpatient Diabetes Program Recommendations:   Insulin - Basal: If steroids are continued as ordered, please ocnsider increasing Levemir to 13 units BID.  Insulin - Meal Coverage: If steroids are continued please consider ordering Novolog 4 units TID with meals for meal coverage if patient eats at least 50% of meals.  Thanks, Barnie Alderman, RN, MSN, CDE Diabetes Coordinator Inpatient Diabetes Program [phone] (Team Pager from 8am to 5pm)

## 2019-04-05 NOTE — Progress Notes (Signed)
PROGRESS NOTE    Jesus Cruz  OTL:[id number] DOB: [birth year] DOA: 03/30/2019 PCP: Garvin Fila, MD   Brief Narrative: 54 year old gentleman with complex comorbidities including CAD ST elevation MI in 2014, hypertension, PE, OSA on CPAP, obesity, hypertension, history of stroke, diabetes mellitus presented via EMS with complaint of chest pain, code STEMI-with complaint of shortness of breath, diaphoresis on arrival, seen by cardiology found to have renal failure hyperkalemia, DKA-was admitted to ICU and managed by PCCM/nephrology/cardiology and underwent cardiac cath 3/24 for inferior ST EMI-occlusion of the PDA other vessels were patent Echo with EF 55 to 60% and continued on medical management aspirin Plavix, heparin. Patient is managed for hyperglycemia, hyperkalemia, MI, volume overload, plan was to start CRRT but he responded well to Lasix.  Patient was stabilized and transferred to [facility] admit 3/26.  Subjective:  Complains of similar ongoing right knee pain, feels somewhat better today. Went for barium swallow study this morning Overnight no fever, blood pressure slightly hypertensive. Creatinine slightly uptrending 1.3  Assessment & Plan:  CAD/inferior ST elevation MI: Status post cardiac cath 3/24,occlusion of the PDA other vessels were patent Echo with EF 55 to 60% and continued on medical management aspirin Plavix, metoprolol 150 mg.  Continue plan as per cardiology.  Continue low-dose statin due to Diflucan  Acute renal failure and CKD/hyperkalemia-reported baseline CKD stage III (unclear type a or b): Nephrology on board signed off 3/25, UOP 1475. Creatinine slightly uptrending. potassium has improved with Lokelma.HD catheter got pulled out 3/26.Monitor renal function next 24 to 48 hours and patient also received contrast for the CT scan abdomen 04/02/19.  Recent Labs  Lab 04/02/19 0355 04/02/19 1842 04/03/19 0358 04/04/19 0542 04/05/19 0351  BUN 45* 38* 30* 29* 32*    CREATININE 1.35* 1.25* 1.22 1.28* 1.37*   Sensation of food stuck in the throat: Speech eval obtained now on dysphagia 2 diet with honey thick liquid. Getting barium swallow evaluation. GI on board. GI deferring EGD at this time due to his MI.  Abdominal discomfort pain postprandial/history of ulcer- post prandial abdominal discomfort for few weeks- Cont ppi q12 hr, diflucan for thrush, will also add Reglan. Reports hx of ulcer in 2016- used to see Dr Thana Farr. Seen by Dr.  Michail Sermon- who obtained CT abdomen pelvis w/ contrast due to severe pain- but no acute finding-no EGD given his MI.  Acute hypoxic respiratory failure secondary to volume overload: On nasal cannula oxygen. Continue supplemental oxygen and wean as tolerated.Chest x-ray-no acute abnormalities, on 2l [place]- continue to wean off.  DM with HHS -HHS resolved: Poorly controlled hemoglobin A1c 12.0 (03/2319),blood sugar borderline controlled likely from steroid increase Levemir to 13 units twice daily and add NovoLog 4 units 3 times daily Premeal cont cont ssi . Diabetic cordinator on board.Monitor and adjust. Recent Labs  Lab 04/04/19 2006 04/05/19 0024 04/05/19 0340 04/05/19 0753 04/05/19 1241  GLUCAP 238* 171* 279* 169* 157*   Essential hypertension:stable.  Continue amlodipine, metoprolol.  History of PE-DVT ultrasound negative, less likely for PE. off heparin drip for his MI, does not appears to be on anticoagulation prior to admission.  Continue nasal cannula continue to wean off oxygen as tolerated  LE wounds/wounds and- cont wound care  Rt knee pain suspecting gouty arthropathy pulses are dopplerable, patient is on chronic morphine at home and colchicine.Continue morphine,prednisone.X-ray shows degenerative changes, appreciate orthopedic input and does not advise injection with steroids at this time as he had one recently.  Leukocytosis: 28.6 on admission but  afebrile-? Reactive versus possible LLE cellulitis.  Procalcitonin  0.4 subsequently 0.4 and antibiotic cefepime was discontinued in ICU.  Monitor. Recent Labs  Lab 03/31/19 0222 04/01/19 0305 04/02/19 0355 04/03/19 0358 04/05/19 0855  WBC 33.8* 24.7* 17.6* 18.2* 19.7*   Plaque in posterior pharynx : Started on Diflucan empirically for thrush 3/26.    Deconditioning/debility : PT OT eval and ambulation  Morbid obesity BMI 49.3 he will benefit with weight loss.  Bilateral Nonobstructive nephrolithiasis seen in the CT scan follow-up with urology. 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 Postop finding in the lumbar spine  On chronic pain medication with opiates -Lyrica morphine and Norco.   Nutrition: Diet Order            DIET DYS 2 Room service appropriate? Yes; Fluid consistency: Honey Thick  Diet effective now              Pressure Ulcer: Pressure Injury 03/31/19 Heel Right;Medial Stage 2 -  Partial thickness loss of dermis presenting as a shallow open injury with a red, pink wound bed without slough. (Active)  03/31/19 1104  Location: Heel  Location Orientation: Right;Medial  Staging: Stage 2 -  Partial thickness loss of dermis presenting as a shallow open injury with a red, pink wound bed without slough.  Wound Description (Comments):   Present on Admission: Yes    DVT prophylaxis:Heparin Code Status: FULL Family Communication: plan of care discussed with patient at bedside. Disposition Plan: Patient is from:HOME Anticipated Disposition: to home health PT  Barriers to discharge or conditions that needs to be met prior to discharge: Patient admitted with ST elevation MI renal failure hypoglycemia in ICU transferred to medical floor on 3/26 and remains hospitalized with deconditioning ongoing right leg knee pain, swallowing issues abdominal pain, anticipating discharge home with home health PT once medically stable, cleared by GI, tolerating diet hopefully in next 24 to 48  hours.  Consultants:gi, cardio, pccm  Procedures:  Left IJ HD cath 3/23- got pulled out 3/26  Echo 03/31/19: 1. Small area of inferior basal hypokinesis. Left ventricular ejection  fraction, by estimation, is 55 to 60%. The left ventricle has normal  function. Left ventricular endocardial border not optimally defined to  evaluate regional wall motion. There is  mild left ventricular hypertrophy. Left ventricular diastolic parameters  were normal.  2. Right ventricular systolic function is normal. The right ventricular  size is normal.  3. The mitral valve was not well visualized. No evidence of mitral valve  regurgitation. No evidence of mitral stenosis.  4. The aortic valve was not well visualized. Aortic valve regurgitation  is not visualized. No aortic stenosis is present.  5. The inferior vena cava is normal in size with greater than 50%  respiratory variability, suggesting right atrial pressure of 3 mmHg.   3/24 B/L DVT US >> RIGHT:  - There is no evidence of deep vein thrombosis in the lower extremity.  However, portions of this examination were limited- see technologist  comments above.  - No cystic structure found in the popliteal fossa.  - Poorly visualized calf veins.    LEFT:  - There is no evidence of deep vein thrombosis in the lower extremity.  However, portions of this examination were limited- see technologist  comments above.  - No cystic structure found in the popliteal fossa.  - Poorly visualized calf veins.   3/24 LHC >>  LIKELY CULPRIT LESION: Mid PDA (small caliber) 100% ->  not good PCI target.  Prox RCA lesion is 30% stenosed.  Previously placed Prox RCA to Mid RCA stent (unknown type) is widely patent.  Mid RCA lesion is 60% stenosed.  1st Diag/Ramus lesion is 40% stenosed.  Ramus/OM lesion is 50% stenosed.  1st Mrg/Distal LCx lesion is 60% stenosed.  LV end diastolic pressure is normal.  Microbiology:see  note   Medications: Scheduled Meds:  amLODipine  10 mg Oral Daily   aspirin EC  81 mg Oral Daily   atorvastatin  10 mg Oral q1800   clopidogrel  75 mg Oral Daily   colchicine  0.6 mg Oral BID   insulin aspart  0-20 Units Subcutaneous Q4H   insulin detemir  10 Units Subcutaneous BID   metoprolol succinate  150 mg Oral Daily   morphine  15 mg Oral TID   pantoprazole (PROTONIX) IV  40 mg Intravenous Q12H   predniSONE  20 mg Oral Q breakfast   pregabalin  100 mg Oral TID   sodium chloride flush  3 mL Intravenous Q12H   sodium chloride flush  3 mL Intravenous Q12H   Continuous Infusions:  sodium chloride     fluconazole (DIFLUCAN) IV 200 mg (04/05/19 1341)    Antimicrobials: Anti-infectives (From admission, onward)   Start     Dose/Rate Route Frequency Ordered Stop   04/02/19 1400  fluconazole (DIFLUCAN) IVPB 200 mg     200 mg 100 mL/hr over 60 Minutes Intravenous Every 24 hours 04/02/19 1331 04/07/19 1359   04/01/19 1000  ceFEPIme (MAXIPIME) 2 g in sodium chloride 0.9 % 100 mL IVPB  Status:  Discontinued     2 g 200 mL/hr over 30 Minutes Intravenous Every 8 hours 04/01/19 0850 04/01/19 1156   03/31/19 1700  vancomycin (VANCOREADY) IVPB 1500 mg/300 mL  Status:  Discontinued     1,500 mg 150 mL/hr over 120 Minutes Intravenous Every 24 hours 03/30/19 1619 03/31/19 1012   03/31/19 1015  vancomycin (VANCOCIN) 2,500 mg in sodium chloride 0.9 % 500 mL IVPB  Status:  Discontinued     2,500 mg 250 mL/hr over 120 Minutes Intravenous  Once 03/31/19 1012 03/31/19 1225   03/30/19 1630  vancomycin (VANCOCIN) 2,500 mg in sodium chloride 0.9 % 500 mL IVPB  Status:  Discontinued     2,500 mg 250 mL/hr over 120 Minutes Intravenous  Once 03/30/19 1619 03/31/19 1012   03/30/19 1630  ceFEPIme (MAXIPIME) 2 g in sodium chloride 0.9 % 100 mL IVPB  Status:  Discontinued     2 g 200 mL/hr over 30 Minutes Intravenous Every 12 hours 03/30/19 1619 04/01/19 0850   03/30/19 1615  ceFEPIme  (MAXIPIME) 2 g in sodium chloride 0.9 % 100 mL IVPB  Status:  Discontinued     2 g 200 mL/hr over 30 Minutes Intravenous  Once 03/30/19 1608 03/30/19 1619   03/30/19 1615  metroNIDAZOLE (FLAGYL) IVPB 500 mg     500 mg 100 mL/hr over 60 Minutes Intravenous  Once 03/30/19 1608 03/30/19 1837   03/30/19 1615  vancomycin (VANCOCIN) IVPB 1000 mg/200 mL premix  Status:  Discontinued     1,000 mg 200 mL/hr over 60 Minutes Intravenous  Once 03/30/19 1608 03/30/19 1618       Objective: Vitals: Today's Vitals   04/04/19 2231 04/05/19 0337 04/05/19 1148 04/05/19 1246  BP:  (!) 159/104 (!) 151/118 111/87  Pulse:  86 87 80  Resp:    16  Temp:  98.9 F (37.2 C)  98.1 F (36.7 C)  TempSrc:  Oral  Oral  SpO2:  99%  94%  Weight:  (!) 138.2 kg    Height:      PainSc: 5   10-Worst pain ever     Intake/Output Summary (Last 24 hours) at 04/05/2019 1409 Last data filed at 04/05/2019 0815 Gross per 24 hour  Intake 3 ml  Output 1325 ml  Net -1322 ml   Filed Weights   04/03/19 0448 04/04/19 0456 04/05/19 0337  Weight: (!) 137.5 kg (!) 137.9 kg (!) 138.2 kg   Weight change: 0.311 kg   Intake/Output from previous day: 03/28 0701 - 03/29 0700 In: 6 [I.V.:6] Out: 1475 [Urine:1475] Intake/Output this shift: Total I/O In: -  Out: 200 [Urine:200]  Examination: General exam: Alert awake oriented not in acute distress in mild pain, obese.   HEENT:Oral mucosa moist, Ear/Nose WNL grossly,dentition normal. Respiratory system: bilaterally clear no use of accessory muscles no wheezing.   Cardiovascular system:S1 & S2 +,regular,No JVD. Gastrointestinal system:Abdomen soft, obese, nonspecific generalized tenderness.   Nervous System:Alert, awake, moving extremities and grossly non-focal. Extremities:Rt knee tender-chronic appearing erythema skin changes hyperpigmentation on right more than left leg.  Distal pulses intact.  Gouty deformities in the upper extremities. Skin:No rashes,no  icterus. ZOX:WRUEAV muscle bulk,tone,power.  Data Reviewed: I have personally reviewed following labs and imaging studies CBC: Recent Labs  Lab 03/30/19 1451 03/30/19 1456 03/31/19 0222 04/01/19 0305 04/02/19 0355 04/03/19 0358 04/05/19 0855  WBC 28.6*   < > 33.8* 24.7* 17.6* 18.2* 19.7*  NEUTROABS 25.5*  --   --   --   --   --   --   HGB 15.2   < > 14.8 13.8 14.2 13.8 12.6*  HCT 45.7   < > 42.5 41.5 44.2 42.4 38.7*  MCV 91.4   < > 88.9 92.6 94.6 94.0 94.4  PLT 454*   < > 364 275 265 289 316   < > = values in this interval not displayed.   Basic Metabolic Panel: Recent Labs  Lab 04/01/19 0305 04/01/19 0305 04/02/19 0355 04/02/19 1842 04/03/19 0358 04/04/19 0542 04/05/19 0351  NA 135   < > 139 134* 136 134* 134*  K 5.1   < > 5.6* 5.3* 5.1 4.9 4.7  CL 103   < > 105 104 107 108 107  CO2 22   < > 25 22 19* 17* 16*  GLUCOSE 151*   < > 96 138* 92 136* 259*  BUN 66*   < > 45* 38* 30* 29* 32*  CREATININE 1.64*   < > 1.35* 1.25* 1.22 1.28* 1.37*  CALCIUM 8.5*   < > 8.9 8.6* 8.9 8.6* 8.5*  MG 2.3  --  2.2  --  2.0 1.8 1.8  PHOS 3.3  --  3.5  --  3.4 4.2 3.9   < > = values in this interval not displayed.   GFR: Estimated Creatinine Clearance: 82.7 mL/min (A) (by C-G formula based on SCr of 1.37 mg/dL (H)). Liver Function Tests: Recent Labs  Lab 03/30/19 1451 03/31/19 0411 04/01/19 0305 04/02/19 0355 04/03/19 0358 04/04/19 0542 04/05/19 0351  AST 40  --   --  33  --   --   --   ALT 37  --   --  32  --   --   --   ALKPHOS 224*  --   --  105  --   --   --   BILITOT 0.9  --   --  1.0  --   --   --   PROT 7.4  --   --  7.0  --   --   --   ALBUMIN 3.0*   < > 2.3* 2.6*   2.5* 2.6* 2.4* 2.5*   < > = values in this interval not displayed.   Recent Labs  Lab 03/30/19 1451 04/02/19 0355  LIPASE 40 55*   Recent Labs  Lab 03/30/19 1512  AMMONIA 31   Coagulation Profile: No results for input(s): INR, PROTIME in the last 168 hours. Cardiac Enzymes: No results for  input(s): CKTOTAL, CKMB, CKMBINDEX, TROPONINI in the last 168 hours. BNP (last 3 results) No results for input(s): PROBNP in the last 8760 hours. HbA1C: No results for input(s): HGBA1C in the last 72 hours. CBG: Recent Labs  Lab 04/04/19 2006 04/05/19 0024 04/05/19 0340 04/05/19 0753 04/05/19 1241  GLUCAP 238* 171* 279* 169* 157*   Lipid Profile: No results for input(s): CHOL, HDL, LDLCALC, TRIG, CHOLHDL, LDLDIRECT in the last 72 hours. Thyroid Function Tests: No results for input(s): TSH, T4TOTAL, FREET4, T3FREE, THYROIDAB in the last 72 hours. Anemia Panel: No results for input(s): VITAMINB12, FOLATE, FERRITIN, TIBC, IRON, RETICCTPCT in the last 72 hours. Sepsis Labs: Recent Labs  Lab 03/30/19 1456 03/30/19 1813 03/31/19 0222 04/01/19 0305 04/02/19 0355  PROCALCITON  --  0.46 0.53 0.41 0.29  LATICACIDVEN 3.4* 2.8*  --   --   --     Recent Results (from the past 240 hour(s))  Respiratory Panel by RT PCR (Flu A&B, Covid) - Nasopharyngeal Swab     Status: None   Collection Time: 03/30/19  2:39 PM   Specimen: Nasopharyngeal Swab  Result Value Ref Range Status   SARS Coronavirus 2 by RT PCR NEGATIVE NEGATIVE Final    Comment: (NOTE) SARS-CoV-2 target nucleic acids are NOT DETECTED. The SARS-CoV-2 RNA is generally detectable in upper respiratoy specimens during the acute phase of infection. The lowest concentration of SARS-CoV-2 viral copies this assay can detect is 131 copies/mL. A negative result does not preclude SARS-Cov-2 infection and should not be used as the sole basis for treatment or other patient management decisions. A negative result may occur with  improper specimen collection/handling, submission of specimen other than nasopharyngeal swab, presence of viral mutation(s) within the areas targeted by this assay, and inadequate number of viral copies (<131 copies/mL). A negative result must be combined with clinical observations, patient history, and  epidemiological information. The expected result is Negative. Fact Sheet for Patients:  PinkCheek.be Fact Sheet for Healthcare Providers:  GravelBags.it This test is not yet ap proved or cleared by the [place] FDA and  has been authorized for detection and/or diagnosis of SARS-CoV-2 by FDA under an Emergency Use Authorization (EUA). This EUA will remain  in effect (meaning this test can be used) for the duration of the COVID-19 declaration under Section 564(b)(1) of the Act, 21 U.S.C. section 360bbb-3(b)(1), unless the authorization is terminated or revoked sooner.    Influenza A by PCR NEGATIVE NEGATIVE Final   Influenza B by PCR NEGATIVE NEGATIVE Final    Comment: (NOTE) The Xpert Xpress SARS-CoV-2/FLU/RSV assay is intended as an aid in  the diagnosis of influenza from Nasopharyngeal swab specimens and  should not be used as a sole basis for treatment. Nasal washings and  aspirates are unacceptable for Xpert Xpress SARS-CoV-2/FLU/RSV  testing. Fact Sheet for Patients: PinkCheek.be Fact Sheet for Healthcare Providers: GravelBags.it This test is not yet approved or  cleared by the [place] and  has been authorized for detection and/or diagnosis of SARS-CoV-2 by  FDA under an Emergency Use Authorization (EUA). This EUA will remain  in effect (meaning this test can be used) for the duration of the  Covid-19 declaration under Section 564(b)(1) of the Act, 21  U.S.C. section 360bbb-3(b)(1), unless the authorization is  terminated or revoked. Performed at [facility], [place] [address] [place], [place] [address]   Culture, blood (routine x 2)     Status: None   Collection Time: 03/30/19  2:50 PM   Specimen: BLOOD  Result Value Ref Range Status   Specimen Description BLOOD RIGHT ANTECUBITAL  Final   Special Requests   Final    BOTTLES DRAWN AEROBIC  AND ANAEROBIC Blood Culture adequate volume   Culture   Final    NO GROWTH 5 DAYS Performed at [facility], [place] [address], [place], [place] [address]    Report Status 04/04/2019 FINAL  Final  Culture, blood (routine x 2)     Status: None   Collection Time: 03/30/19  4:47 PM   Specimen: BLOOD LEFT FOREARM  Result Value Ref Range Status   Specimen Description BLOOD LEFT FOREARM  Final   Special Requests   Final    BOTTLES DRAWN AEROBIC AND ANAEROBIC Blood Culture adequate volume   Culture   Final    NO GROWTH 5 DAYS Performed at [facility], [place] [address] [place], [place] [address]    Report Status 04/04/2019 FINAL  Final  MRSA PCR Screening     Status: Abnormal   Collection Time: 03/30/19  4:54 PM   Specimen: Nasopharyngeal  Result Value Ref Range Status   MRSA by PCR POSITIVE (A) NEGATIVE Final    Comment:        The GeneXpert MRSA Assay (FDA approved for NASAL specimens only), is one component of a comprehensive MRSA colonization surveillance program. It is not intended to diagnose MRSA infection nor to guide or monitor treatment for MRSA infections. RESULT CALLED TO, READ BACK BY AND VERIFIED WITH: R ROBERTS RN 03/30/19 1853 JDW Performed at [facility], [place] [address], [place],  [address]       Radiology Studies: DG Swallowing Func-Speech Pathology  Result Date: 04/05/2019 Objective Swallowing Evaluation: Type of Study: MBS-Modified Barium Swallow Study  Patient Details Name: Maisen Klingler MRN: [id number] Date of Birth: [birth year] Today's Date: 04/05/2019 Time: SLP Start Time (ACUTE ONLY): 0945 -SLP Stop Time (ACUTE ONLY): 1000 SLP Time Calculation (min) (ACUTE ONLY): 15 min Past Medical History: Past Medical History: Diagnosis Date  CVA (cerebral vascular accident) ([place])   Gout   Hypertension   MGUS (monoclonal gammopathy of unknown significance)   Obesity   OSA on CPAP   Pulmonary embolus ([place]) 05/2012  During recovery from MI, completed  Coumadin  STEMI (ST elevation myocardial infarction) ([place]) 04/2012  Integrity BMS RCA with minor left system disease Past Surgical History: Past Surgical History: Procedure Laterality Date  CORONARY ANGIOPLASTY WITH STENT PLACEMENT  04/2012  Integrity BMS RCA, minor L system dz  LEFT HEART CATH AND CORONARY ANGIOGRAPHY N/A 03/31/2019  Procedure: LEFT HEART CATH AND CORONARY ANGIOGRAPHY;  Surgeon: Leonie Man, MD;  Location: [facility];  Service: Cardiovascular;  Laterality: N/A;  RADIOLOGY WITH ANESTHESIA N/A 09/16/2013  Procedure: RADIOLOGY WITH ANESTHESIA;  Surgeon: Rob Hickman, MD;  Location: [facility];  Service: Radiology;  Laterality: N/A; HPI: 54 year old  male with past medical history significant for CAD, hypertension, and prior CVA, admitted with complaints of chest pain as a code STEMI found to have hyperkalemia, acute renal failure, and DKA. Pt uinderwent LHC on 3/24 with recommendation for medical management. Pt has had c/o liquids > solids "pooling" in his throat, therefore SLP swallow evaluation was ordered.  Subjective: alert, pleasant, feels like he is having the most trouble with thin liquids Assessment / Plan / Recommendation CHL IP CLINICAL IMPRESSIONS 04/05/2019 Clinical Impression Pt demonstrates a moderate pharyngeal dysphagia with an anatomical variant that may be impacting epiglottic deflection and laryngeal closure. Pt is noted to have bilateral projections between the epiglottis and arytenoids, suspected to be protrusions of the superior horn of the thyroid cartilage. Barium travels around these structures, however, pt has no deflection of the epiglottis despite othewise adequate movement of hyoid excursion and laryngeal elevation. Base of tongue retarction could be better, but is not the primary deficit. Lack of epiglottic deflection causes consistent high penetration of liquids and impacts clearance of solids with mild to moderate pharyngeal residue. Pt is able to protect  his airway with strong vestibular closure, but occasionally needs a cough or throat clear to fully eject penetrate post swallow. Pt needed cueing to learn a throat clear, which can be more efficient in clearing the vestibule than a cough.  A chin tuck is beneficial to decrease penetration. Thickened liquids were also penetrated so there is minimal benefit in thickening unless pt feels increased comfort. Will f/u with direct imaging of the larynx via FEES to further visualize pts anatomy and impact on swallow function.  SLP Visit Diagnosis Dysphagia, unspecified (R13.10) Attention and concentration deficit following -- Frontal lobe and executive function deficit following -- Impact on safety and function Mild aspiration risk;Moderate aspiration risk   CHL IP TREATMENT RECOMMENDATION 04/05/2019 Treatment Recommendations F/U FEES in --- days (Comment)   Prognosis 04/05/2019 Prognosis for Safe Diet Advancement Good Barriers to Reach Goals -- Barriers/Prognosis Comment -- CHL IP DIET RECOMMENDATION 04/05/2019 SLP Diet Recommendations Dysphagia 3 (Mech soft) solids;Thin liquid Liquid Administration via Straw Medication Administration Whole meds with puree Compensations Clear throat after each swallow;Follow solids with liquid;Chin tuck;Use straw to facilitate chin tuck Postural Changes Seated upright at 90 degrees   CHL IP OTHER RECOMMENDATIONS 04/05/2019 Recommended Consults Consider ENT evaluation Oral Care Recommendations Oral care BID Other Recommendations --   CHL IP FOLLOW UP RECOMMENDATIONS 04/05/2019 Follow up Recommendations Other (comment)   CHL IP FREQUENCY AND DURATION 04/05/2019 Speech Therapy Frequency (ACUTE ONLY) min 2x/week Treatment Duration 2 weeks      CHL IP ORAL PHASE 04/05/2019 Oral Phase WFL Oral - Pudding Teaspoon -- Oral - Pudding Cup -- Oral - Honey Teaspoon -- Oral - Honey Cup -- Oral - Nectar Teaspoon -- Oral - Nectar Cup WFL;Lingual/palatal residue Oral - Nectar Straw WFL;Lingual/palatal residue  Oral - Thin Teaspoon -- Oral - Thin Cup [facility];Lingual/palatal residue Oral - Thin Straw WFL;Lingual/palatal residue Oral - Puree WFL;Lingual/palatal residue Oral - Mech Soft -- Oral - Regular WFL;Lingual/palatal residue Oral - Multi-Consistency -- Oral - Pill -- Oral Phase - Comment --  CHL IP PHARYNGEAL PHASE 04/05/2019 Pharyngeal Phase Impaired Pharyngeal- Pudding Teaspoon -- Pharyngeal -- Pharyngeal- Pudding Cup -- Pharyngeal -- Pharyngeal- Honey Teaspoon -- Pharyngeal -- Pharyngeal- Honey Cup -- Pharyngeal -- Pharyngeal- Nectar Teaspoon -- Pharyngeal -- Pharyngeal- Nectar Cup Reduced epiglottic inversion;Reduced airway/laryngeal closure;Reduced tongue base retraction;Penetration/Aspiration during swallow;Penetration/Apiration after swallow;Trace aspiration;Pharyngeal residue - pyriform;Delayed swallow initiation-pyriform sinuses Pharyngeal Material enters  airway, remains ABOVE vocal cords then ejected out;Material enters airway, CONTACTS cords and not ejected out;Material enters airway, CONTACTS cords and then ejected out Pharyngeal- Nectar Straw Reduced epiglottic inversion;Reduced airway/laryngeal closure;Reduced tongue base retraction;Pharyngeal residue - pyriform;Delayed swallow initiation-pyriform sinuses;Compensatory strategies attempted (with notebox);Penetration/Aspiration during swallow Pharyngeal Material enters airway, remains ABOVE vocal cords then ejected out Pharyngeal- Thin Teaspoon -- Pharyngeal -- Pharyngeal- Thin Cup Reduced epiglottic inversion;Reduced airway/laryngeal closure;Reduced tongue base retraction;Penetration/Aspiration during swallow;Penetration/Apiration after swallow;Trace aspiration;Pharyngeal residue - pyriform;Delayed swallow initiation-pyriform sinuses Pharyngeal Material enters airway, passes BELOW cords then ejected out;Material enters airway, remains ABOVE vocal cords then ejected out;Material does not enter airway Pharyngeal- Thin Straw Reduced epiglottic inversion;Reduced  airway/laryngeal closure;Reduced tongue base retraction;Pharyngeal residue - pyriform;Delayed swallow initiation-pyriform sinuses;Compensatory strategies attempted (with notebox);Penetration/Aspiration during swallow Pharyngeal -- Pharyngeal- Puree Reduced epiglottic inversion;Delayed swallow initiation-vallecula;Pharyngeal residue - pyriform Pharyngeal -- Pharyngeal- Mechanical Soft -- Pharyngeal -- Pharyngeal- Regular Delayed swallow initiation-vallecula;Reduced epiglottic inversion;Compensatory strategies attempted (with notebox) Pharyngeal -- Pharyngeal- Multi-consistency -- Pharyngeal -- Pharyngeal- Pill -- Pharyngeal -- Pharyngeal Comment --  No flowsheet data found. Herbie Baltimore, MA CCC-SLP Acute Rehabilitation Services Pager [phone] Office [phone] Lynann Beaver 04/05/2019, 11:34 AM              DG ESOPHAGUS W SINGLE CM (SOL OR THIN BA)  Result Date: 04/05/2019 CLINICAL DATA:  Food getting stuck.  Dysphagia. EXAM: ESOPHOGRAM/BARIUM SWALLOW TECHNIQUE: Single contrast examination was performed using  thin barium. FLUOROSCOPY TIME:  Fluoroscopy Time:  1 minutes 24 seconds Radiation Exposure Index (if provided by the fluoroscopic device): Number of Acquired Spot Images: 0 COMPARISON:  None. FINDINGS: No aspiration identified. Moderate esophageal dysmotility. Barium passes readily into the stomach without delay. No esophageal dilatation. No stricture or mass. No hiatal hernia identified. IMPRESSION: Esophageal dysmotility.  No stricture or mass identified. Electronically Signed   By: Franchot Gallo M.D.   On: 04/05/2019 11:10     LOS: 6 days   Time spent: More than 50% of that time was spent in counseling and/or coordination of care.  Antonieta Pert, MD [facility]  04/05/2019, 2:09 PM

## 2019-04-05 NOTE — Progress Notes (Signed)
Occupational Therapy Treatment Patient Details Name: Jesus Cruz MRN: [id number] DOB: 02-03-1965 Today's Date: 04/05/2019    History of present illness 54 year old male with past medical history significant for CAD, hypertension, and prior CVA, admitted with complaints of chest pain as a code STEMI found to have hyperkalemia, acute renal failure, and DKA. Pt uinderwent LHC on 3/24 with recommendation for medical management.   OT comments  Patient supine in bed and agreeable to OT.  Patient labile during session, reports being "home sick".  Patient remains limited by pain in R knee, but able to complete transfer to recliner with min guard assist. Grooming with setup assist. Next session plan to focus on LB dressing and standing tolerance for ADLs.    Follow Up Recommendations  No OT follow up    Equipment Recommendations  None recommended by OT    Recommendations for Other Services      Precautions / Restrictions Precautions Precautions: Fall Precaution Comments: R knee known to buckle Restrictions Weight Bearing Restrictions: No       Mobility Bed Mobility Overal bed mobility: Modified Independent                Transfers Overall transfer level: Needs assistance   Transfers: Sit to/from Stand;Stand Pivot Transfers Sit to Stand: Min guard Stand pivot transfers: Min guard       General transfer comment: min guard for safety/balance    Balance Overall balance assessment: Needs assistance Sitting-balance support: No upper extremity supported;Feet supported Sitting balance-Leahy Scale: Good     Standing balance support: During functional activity;Single extremity supported Standing balance-Leahy Scale: Fair Standing balance comment: min guard for transfer, 1 UE support; limited tolerance due to pain                            ADL either performed or assessed with clinical judgement   ADL Overall ADL's : Needs assistance/impaired     Grooming:  Brushing hair;Sitting;Set up                   Toilet Transfer: Min Designer, jewellery Details (indicate cue type and reason): simulated to recliner          Functional mobility during ADLs: Min guard General ADL Comments: pt limited by pain      Vision       Perception     Praxis      Cognition Arousal/Alertness: Awake/alert Behavior During Therapy: WFL for tasks assessed/performed Overall Cognitive Status: Within Functional Limits for tasks assessed                                          Exercises     Shoulder Instructions       General Comments on RA, VSS     Pertinent Vitals/ Pain       Pain Assessment: 0-10 Pain Score: 9  Pain Location: R knee (gout flare up) Pain Descriptors / Indicators: Grimacing;Guarding;Sore Pain Intervention(s): Limited activity within patient's tolerance;Monitored during session;Patient requesting pain meds-RN notified;Ice applied  Home Living                                          Prior Functioning/Environment  Frequency  Min 2X/week        Progress Toward Goals  OT Goals(current goals can now be found in the care plan section)  Progress towards OT goals: Progressing toward goals  Acute Rehab OT Goals Patient Stated Goal: move better, "do more for myself" OT Goal Formulation: With patient  Plan Discharge plan remains appropriate;Frequency remains appropriate    Co-evaluation                 AM-PAC OT "6 Clicks" Daily Activity     Outcome Measure   Help from another person eating meals?: None Help from another person taking care of personal grooming?: A Little Help from another person toileting, which includes using toliet, bedpan, or urinal?: A Lot Help from another person bathing (including washing, rinsing, drying)?: A Lot Help from another person to put on and taking off regular upper body clothing?: A Little Help from another  person to put on and taking off regular lower body clothing?: A Lot 6 Click Score: 16    End of Session    OT Visit Diagnosis: Unsteadiness on feet (R26.81);Pain Pain - Right/Left: Right Pain - part of body: Knee   Activity Tolerance Patient tolerated treatment well   Patient Left in chair;with call bell/phone within reach   Nurse Communication Mobility status;Patient requests pain meds        Time: [id number] OT Time Calculation (min): 24 min  Charges: OT General Charges $OT Visit: 1 Visit OT Treatments $Self Care/Home Management : 23-37 mins  [place] Pager [phone] Office [phone]    Delight Stare 04/05/2019, 12:57 PM

## 2019-04-05 NOTE — Progress Notes (Signed)
He still feels something sticking in his throat and therefore we are getting a barium swallow today which has not been done yet.  Await barium swallow.

## 2019-04-05 NOTE — Care Management Important Message (Signed)
Important Message  Patient Details  Name: Jesus Cruz MRN: [id number] Date of Birth: 23-Oct-1965   Medicare Important Message Given:  Yes     Shelda Altes 04/05/2019, 10:28 AM

## 2019-04-06 ENCOUNTER — Other Ambulatory Visit: Payer: Self-pay

## 2019-04-06 ENCOUNTER — Encounter (HOSPITAL_COMMUNITY): Payer: Self-pay | Admitting: Internal Medicine

## 2019-04-06 DIAGNOSIS — R1314 Dysphagia, pharyngoesophageal phase: Secondary | ICD-10-CM

## 2019-04-06 LAB — GLUCOSE, CAPILLARY
Glucose-Capillary: 143 mg/dL — ABNORMAL HIGH (ref 70–99)
Glucose-Capillary: 121 mg/dL — ABNORMAL HIGH (ref 70–99)
Glucose-Capillary: 209 mg/dL — ABNORMAL HIGH (ref 70–99)
Glucose-Capillary: 248 mg/dL — ABNORMAL HIGH (ref 70–99)
Glucose-Capillary: 273 mg/dL — ABNORMAL HIGH (ref 70–99)

## 2019-04-06 LAB — CBC
HCT: 39.8 % (ref 39.0–52.0)
Hemoglobin: 12.7 g/dL — ABNORMAL LOW (ref 13.0–17.0)
MCH: 30.1 pg (ref 26.0–34.0)
MCHC: 31.9 g/dL (ref 30.0–36.0)
MCV: 94.3 fL (ref 80.0–100.0)
Platelets: 334 10*3/uL (ref 150–400)
RBC: 4.22 MIL/uL (ref 4.22–5.81)
RDW: 12.9 % (ref 11.5–15.5)
WBC: 23.6 10*3/uL — ABNORMAL HIGH (ref 4.0–10.5)
nRBC: 0 % (ref 0.0–0.2)

## 2019-04-06 LAB — RENAL FUNCTION PANEL
Albumin: 2.6 g/dL — ABNORMAL LOW (ref 3.5–5.0)
Anion gap: 10 (ref 5–15)
BUN: 36 mg/dL — ABNORMAL HIGH (ref 6–20)
CO2: 17 mmol/L — ABNORMAL LOW (ref 22–32)
Calcium: 9 mg/dL (ref 8.9–10.3)
Chloride: 108 mmol/L (ref 98–111)
Creatinine, Ser: 1.41 mg/dL — ABNORMAL HIGH (ref 0.61–1.24)
GFR calc Af Amer: >60 mL/min (ref >60)
GFR calc non Af Amer: 56 mL/min — ABNORMAL LOW (ref >60)
Glucose, Bld: 144 mg/dL — ABNORMAL HIGH (ref 70–99)
Phosphorus: 4 mg/dL (ref 2.5–4.6)
Potassium: 4.8 mmol/L (ref 3.5–5.1)
Sodium: 135 mmol/L (ref 135–145)

## 2019-04-06 LAB — MAGNESIUM: Magnesium: 1.9 mg/dL (ref 1.7–2.4)

## 2019-04-06 LAB — PROCALCITONIN: Procalcitonin: <0.1 ng/mL

## 2019-04-06 MED ORDER — PREDNISONE 10 MG PO TABS
10.0000 mg | ORAL_TABLET | Freq: Every day | ORAL | Status: DC
  Administered 2019-04-07: 10 mg via ORAL
  Filled 2019-04-06: qty 1

## 2019-04-06 MED ORDER — INSULIN DETEMIR 100 UNIT/ML ~~LOC~~ SOLN
13.0000 [IU] | Freq: Two times a day (BID) | SUBCUTANEOUS | Status: DC
  Administered 2019-04-06 – 2019-04-07 (×3): 13 [IU] via SUBCUTANEOUS
  Filled 2019-04-06 (×4): qty 0.13

## 2019-04-06 MED ORDER — INSULIN ASPART 100 UNIT/ML ~~LOC~~ SOLN
4.0000 [IU] | Freq: Three times a day (TID) | SUBCUTANEOUS | Status: DC
  Administered 2019-04-06 – 2019-04-07 (×4): 4 [IU] via SUBCUTANEOUS

## 2019-04-06 MED ORDER — INSULIN ASPART 100 UNIT/ML ~~LOC~~ SOLN
0.0000 [IU] | Freq: Three times a day (TID) | SUBCUTANEOUS | Status: DC
  Administered 2019-04-06 – 2019-04-07 (×4): 7 [IU] via SUBCUTANEOUS

## 2019-04-06 MED ORDER — HEPARIN SODIUM (PORCINE) 5000 UNIT/ML IJ SOLN
5000.0000 [IU] | Freq: Three times a day (TID) | INTRAMUSCULAR | Status: DC
  Administered 2019-04-06 – 2019-04-07 (×5): 5000 [IU] via SUBCUTANEOUS
  Filled 2019-04-06 (×5): qty 1

## 2019-04-06 NOTE — Progress Notes (Signed)
Physical Therapy Treatment Patient Details Name: Jesus Cruz MRN: [id number] DOB: 03-10-1965 Today's Date: 04/06/2019    History of Present Illness 54 year old male with past medical history significant for CAD, hypertension, and prior CVA, admitted with complaints of chest pain as a code STEMI found to have hyperkalemia, acute renal failure, and DKA. Pt uinderwent LHC on 3/24 with recommendation for medical management.    PT Comments    Pt is making great progress with mobility he reported that he was using crutches at home and was agreeable to attempting ambulation with crutches today. Pt is at mod I with bed mob and supervision for transfers, able to transfer with walker and now crutches. Pt ambulated approx 179ft with B crutches and min a, 4 point gait pattern, some ataxia noted on R side, with hiking to clear RLE. Pt needed one seated rest break to complete and on room air was noted to dest to min of 86%. Overall pt is making great progress with mobility. He reports that he wears an AFO when at home to assist with RLE clearance, he states he has had this since 2016, and it was caused some skin damage on lateral side of leg but also limits his ability to wear a knee brace, which may help with stability of this joint. Also AFO seems to be unsuitable as foot shape has changed in past years since its fabrication. Therapist has recommended seeing a pedorthist to see if a custom/semi custom shoe may remedy all problems currently being caused by use of ill fitting AFO.     Follow Up Recommendations  Home health PT;Supervision for mobility/OOB     Equipment Recommendations  None recommended by PT    Recommendations for Other Services       Precautions / Restrictions Precautions Precautions: Fall Precaution Comments: R knee known to buckle Restrictions Weight Bearing Restrictions: No    Mobility  Bed Mobility Overal bed mobility: Modified Independent                 Transfers Overall transfer level: Needs assistance Equipment used: Crutches Transfers: Sit to/from [facility] Sit to Stand: Min guard Stand pivot transfers: Min guard       General transfer comment: reports uses crutches at home  Ambulation/Gait Ambulation/Gait assistance: Min assist Gait Distance (Feet): 174 Feet Assistive device: Crutches Gait Pattern/deviations: Step-through pattern;Trunk flexed;Ataxic(4 point patter, tends to hike R side to clear foot) Gait velocity: dec   General Gait Details: 187ft with crutches min a with 1 seated rest on room air desat min 86%   Stairs             Wheelchair Mobility    Modified Rankin (Stroke Patients Only)       Balance Overall balance assessment: Needs assistance Sitting-balance support: Feet unsupported;No upper extremity supported Sitting balance-Leahy Scale: Good     Standing balance support: During functional activity;Bilateral upper extremity supported Standing balance-Leahy Scale: Fair                              Cognition Arousal/Alertness: Awake/alert Behavior During Therapy: WFL for tasks assessed/performed Overall Cognitive Status: Within Functional Limits for tasks assessed                                        Exercises      General Comments  Pertinent Vitals/Pain Pain Assessment: Faces Faces Pain Scale: Hurts whole lot Pain Location: R knee with mobility/ambulation Pain Descriptors / Indicators: Grimacing;Guarding;Discomfort Pain Intervention(s): Limited activity within patient's tolerance;Monitored during session    Home Living Family/patient expects to be discharged to:: Private residence Living Arrangements: Parent                  Prior Function            PT Goals (current goals can now be found in the care plan section) Acute Rehab PT Goals Patient Stated Goal: move better, "do more for myself" PT Goal  Formulation: With patient Time For Goal Achievement: 04/15/19 Potential to Achieve Goals: Good Progress towards PT goals: Progressing toward goals    Frequency    Min 3X/week      PT Plan Current plan remains appropriate    Co-evaluation              AM-PAC PT "6 Clicks" Mobility   Outcome Measure  Help needed turning from your back to your side while in a flat bed without using bedrails?: None Help needed moving from lying on your back to sitting on the side of a flat bed without using bedrails?: A Little Help needed moving to and from a bed to a chair (including a wheelchair)?: A Little Help needed standing up from a chair using your arms (e.g., wheelchair or bedside chair)?: A Little Help needed to walk in hospital room?: A Lot Help needed climbing 3-5 steps with a railing? : Total 6 Click Score: 16    End of Session Equipment Utilized During Treatment: Gait belt Activity Tolerance: Patient limited by fatigue;Patient limited by pain Patient left: in bed;with call bell/phone within reach   PT Visit Diagnosis: Other abnormalities of gait and mobility (R26.89);Muscle weakness (generalized) (M62.81);Pain Pain - Right/Left: Right Pain - part of body: Knee     Time: 1120-1214 PT Time Calculation (min) (ACUTE ONLY): 54 min  Charges:  $Gait Training: 8-22 mins $Therapeutic Activity: 8-22 mins                     Horald Chestnut, PT    Delford Field 04/06/2019, 1:30 PM

## 2019-04-06 NOTE — Progress Notes (Signed)
PROGRESS NOTE    Jesus Cruz  TKP:[id number] DOB: 05/19/1965 DOA: 03/30/2019 PCP: Garvin Fila, MD   Brief Narrative: 54 year old gentleman with complex comorbidities including CAD ST elevation MI in 2014, hypertension, PE, OSA on CPAP, obesity, hypertension, history of stroke, diabetes mellitus presented via EMS with complaint of chest pain, code STEMI-with complaint of shortness of breath, diaphoresis on arrival, seen by cardiology found to have renal failure hyperkalemia, DKA-was admitted to ICU and managed by PCCM/nephrology/cardiology and underwent cardiac cath 3/24 for inferior STEMI-occlusion of the PDA other vessels were patent, Echo with EF 55 to 60% and continued on medical management aspirin Plavix, heparin gtt.Patient was managed for hyperglycemia, hyperkalemia, MI, volume overload, plan was to start CRRT but he responded well to Lasix. Patient was stabilized and transferred to [facility] 3/26. Patient c/o ongoing abdominal pain, sensation of difficulty swallowing/food stuck in the throat, knee pain   Subjective:  Patient was sitting on the bedside chair for the first time today, feels well and oxygen is being discontinued.   Has right knee pain reports right leg is slightly more red today-wbc further up.   Nausea vomiting fever chills still complains of voice difficulties, sensation of food in the throat, and postprandial abd discomfort but overall better compared to 1-2 days ago. No chest pain.  Assessment & Plan:  CAD/inferior ST elevation MI: Status post cardiac cath 3/24,occlusion of the PDA other vessels were patent Echo with EF 55 to 60% and continued on medical management aspirin Plavix, metoprolol 150 mg.Continue plan as per cardiology.Continue low-dose statin due to Diflucan  Acute renal failure and CKD/hyperkalemia-reported baseline CKD stage III (unclear type a or b): Nephro signed off. K improved with Lokelma.HD catheter got pulled out 3/26- creat uptrending-monitor renal  function next 24 to 48 hours as patient also received contrast for the CT scan abdomen 04/02/19.  Recent Labs  Lab 04/02/19 1842 04/03/19 0358 04/04/19 0542 04/05/19 0351 04/06/19 0654  BUN 38* 30* 29* 32* 36*  CREATININE 1.25* 1.22 1.28* 1.37* 1.41*   Sensation of food stuck in the throat: Speech eval obtained- on dysphagia diet, barium swallow evaluation shows esophageal dysmotility, speech advises ENT evaluation for direct imaging of the larynx, I have discussed Dr. Lucia Gaskins who will evaluate the patient.CONT ON DIET MODIFICATION.  Abdominal discomfort pain postprandial/history of ulcer- post prandial abdominal discomfort for few weeks- Cont ppi q12 hr, diflucan for thrush-completing today, cont Reglan.Reports hx of ulcer in 2016- used to see Dr Thana Farr. Seen by Dr.  Michail Sermon- who obtained CT abdomen pelvis w/ contrast due to severe pain- but no acute finding-no EGD given his MI.  Acute hypoxic respiratory failure secondary to volume overload: being weaned off. Last Chest x-ray-no acute abnormalities  DM with HHS -HHS resolved: Poorly controlled hemoglobin A1c 12.0 (03/2319),blood sugar borderline controlled likely from steroid increase Levemir to 13 units twice daily and added NovoLog 4 units 3 times daily Premeal cont cont ssi . Diabetic cordinator on board.Monitor and adjust. Recent Labs  Lab 04/05/19 2024 04/05/19 2337 04/06/19 0448 04/06/19 0804 04/06/19 1110  GLUCAP 333* 193* 143* 121* 209*   Essential hypertension:stable.  Continue amlodipine, metoprolol.  History of PE-DVT ultrasound negative on admission,less likely for PE. Was on heparin drip for his MI- now off. does not appears to be on anticoagulation prior to admission.  Continue nasal cannula continue to wean off oxygen as tolerated  LE wounds/wounds and- cont wound care  Rt knee pain suspecting gouty arthropathy pulses are dopplerable, patient is  on chronic morphine at home and colchicine.Seen by orthopedic surgery no  concern for septic arthritis and also had arthrocentesis performed recently as o/p prior to knee injection mid march and had no infection per patient. Started on prednisone for acute gout flare up- decrease prednisone to 20 mg.X-ray shows degenerative changes. Ortho does not advise injection with steroids at this time as he had one recently.  Leukocytosis: 28.6 on admission but afebrile-? Reactive versus possible LE cellulitis.  Procalcitonin in 0.4 and was on antibiotics-discontinued in ICU.  WBC uptrending check procalcitonin level, suspect from steroid. Monitor in LE. Recent Labs  Lab 04/01/19 0305 04/02/19 0355 04/03/19 0358 04/05/19 0855 04/06/19 0654  WBC 24.7* 17.6* 18.2* 19.7* 23.6*   Plaque in posterior pharynx : completing diflucan 3/30  Deconditioning/debility : PT OT eval and ambulation  Morbid obesity BMI 49.3 he will benefit with weight loss.  Bilateral Nonobstructive nephrolithiasis seen in the CT scan follow-up with urology. 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 Postop finding in the lumbar spine  On chronic pain medication with opiates -Lyrica morphine and Norco.   Nutrition: Diet Order            DIET DYS 2 Room service appropriate? Yes; Fluid consistency: Honey Thick  Diet effective now              Pressure Ulcer: Pressure Injury 03/31/19 Heel Right;Medial Stage 2 -  Partial thickness loss of dermis presenting as a shallow open injury with a red, pink wound bed without slough. (Active)  03/31/19 1104  Location: Heel  Location Orientation: Right;Medial  Staging: Stage 2 -  Partial thickness loss of dermis presenting as a shallow open injury with a red, pink wound bed without slough.  Wound Description (Comments):   Present on Admission: Yes    DVT prophylaxis:Heparin Code Status: FULL Family Communication: plan of care discussed with patient at bedside. Disposition Plan: Patient is from:HOME Anticipated  Disposition: to home health PT  Barriers to discharge or conditions that needs to be met prior to discharge: Patient admitted with ST elevation MI renal failure hypoglycemia in ICU transferred to medical floor on 3/26 and remains hospitalized with deconditioning ongoing right leg knee pain, swallowing/speech issues abdominal pain, anticipating discharge home with home health PT once medically stable, rean fun stable, tolerating diet- hopefully in next 24 to 48 hours.  Consultants:gi, cardio, pccm  Procedures:  Left IJ HD cath 3/23- got pulled out 3/26  Echo 03/31/19: 1. Small area of inferior basal hypokinesis. Left ventricular ejection  fraction, by estimation, is 55 to 60%. The left ventricle has normal  function. Left ventricular endocardial border not optimally defined to  evaluate regional wall motion. There is  mild left ventricular hypertrophy. Left ventricular diastolic parameters  were normal.  2. Right ventricular systolic function is normal. The right ventricular  size is normal.  3. The mitral valve was not well visualized. No evidence of mitral valve  regurgitation. No evidence of mitral stenosis.  4. The aortic valve was not well visualized. Aortic valve regurgitation  is not visualized. No aortic stenosis is present.  5. The inferior vena cava is normal in size with greater than 50%  respiratory variability, suggesting right atrial pressure of 3 mmHg.   3/24 B/L DVT US >> RIGHT:  - There is no evidence of deep vein thrombosis in the lower extremity.  However, portions of this examination were limited- see technologist  comments above.  - No  cystic structure found in the popliteal fossa.  - Poorly visualized calf veins.    LEFT:  - There is no evidence of deep vein thrombosis in the lower extremity.  However, portions of this examination were limited- see technologist  comments above.  - No cystic structure found in the popliteal fossa.  - Poorly visualized  calf veins.   3/24 LHC >>  LIKELY CULPRIT LESION: Mid PDA (small caliber) 100% -> not good PCI target.  Prox RCA lesion is 30% stenosed.  Previously placed Prox RCA to Mid RCA stent (unknown type) is widely patent.  Mid RCA lesion is 60% stenosed.  1st Diag/Ramus lesion is 40% stenosed.  Ramus/OM lesion is 50% stenosed.  1st Mrg/Distal LCx lesion is 60% stenosed.  LV end diastolic pressure is normal.  Microbiology:see note   Medications: Scheduled Meds:  amLODipine  10 mg Oral Daily   aspirin EC  81 mg Oral Daily   atorvastatin  10 mg Oral q1800   clopidogrel  75 mg Oral Daily   colchicine  0.6 mg Oral BID   heparin injection (subcutaneous)  5,000 Units Subcutaneous Q8H   insulin aspart  0-20 Units Subcutaneous TID AC & HS   insulin aspart  4 Units Subcutaneous TID WC   insulin detemir  13 Units Subcutaneous BID   metoCLOPramide  5 mg Oral TID AC   metoprolol succinate  150 mg Oral Daily   morphine  15 mg Oral TID   pantoprazole (PROTONIX) IV  40 mg Intravenous Q12H   predniSONE  20 mg Oral Q breakfast   pregabalin  100 mg Oral TID   sodium chloride flush  3 mL Intravenous Q12H   sodium chloride flush  3 mL Intravenous Q12H   Continuous Infusions:  sodium chloride     fluconazole (DIFLUCAN) IV 200 mg (04/06/19 1341)    Antimicrobials: Anti-infectives (From admission, onward)   Start     Dose/Rate Route Frequency Ordered Stop   04/02/19 1400  fluconazole (DIFLUCAN) IVPB 200 mg     200 mg 100 mL/hr over 60 Minutes Intravenous Every 24 hours 04/02/19 1331 04/07/19 1359   04/01/19 1000  ceFEPIme (MAXIPIME) 2 g in sodium chloride 0.9 % 100 mL IVPB  Status:  Discontinued     2 g 200 mL/hr over 30 Minutes Intravenous Every 8 hours 04/01/19 0850 04/01/19 1156   03/31/19 1700  vancomycin (VANCOREADY) IVPB 1500 mg/300 mL  Status:  Discontinued     1,500 mg 150 mL/hr over 120 Minutes Intravenous Every 24 hours 03/30/19 1619 03/31/19 1012   03/31/19  1015  vancomycin (VANCOCIN) 2,500 mg in sodium chloride 0.9 % 500 mL IVPB  Status:  Discontinued     2,500 mg 250 mL/hr over 120 Minutes Intravenous  Once 03/31/19 1012 03/31/19 1225   03/30/19 1630  vancomycin (VANCOCIN) 2,500 mg in sodium chloride 0.9 % 500 mL IVPB  Status:  Discontinued     2,500 mg 250 mL/hr over 120 Minutes Intravenous  Once 03/30/19 1619 03/31/19 1012   03/30/19 1630  ceFEPIme (MAXIPIME) 2 g in sodium chloride 0.9 % 100 mL IVPB  Status:  Discontinued     2 g 200 mL/hr over 30 Minutes Intravenous Every 12 hours 03/30/19 1619 04/01/19 0850   03/30/19 1615  ceFEPIme (MAXIPIME) 2 g in sodium chloride 0.9 % 100 mL IVPB  Status:  Discontinued     2 g 200 mL/hr over 30 Minutes Intravenous  Once 03/30/19 1608 03/30/19 1619   03/30/19  1615  metroNIDAZOLE (FLAGYL) IVPB 500 mg     500 mg 100 mL/hr over 60 Minutes Intravenous  Once 03/30/19 1608 03/30/19 1837   03/30/19 1615  vancomycin (VANCOCIN) IVPB 1000 mg/200 mL premix  Status:  Discontinued     1,000 mg 200 mL/hr over 60 Minutes Intravenous  Once 03/30/19 1608 03/30/19 1618       Objective: Vitals: Today's Vitals   04/05/19 2000 04/05/19 2021 04/06/19 0446 04/06/19 0955  BP:  (!) 135/91 117/76 117/71  Pulse:  69 91 85  Resp:      Temp:  98.5 F (36.9 C) 97.9 F (36.6 C)   TempSrc:  Oral Oral   SpO2:  94% 97%   Weight:   (!) 138.4 kg   Height:      PainSc: 0-No pain   8     Intake/Output Summary (Last 24 hours) at 04/06/2019 1405 Last data filed at 04/06/2019 0948 Gross per 24 hour  Intake 759.97 ml  Output 952 ml  Net -192.03 ml   Filed Weights   04/04/19 0456 04/05/19 0337 04/06/19 0446  Weight: (!) 137.9 kg (!) 138.2 kg (!) 138.4 kg   Weight change: 0.189 kg   Intake/Output from previous day: 03/29 0701 - 03/30 0700 In: 400 [P.O.:100; IV Piggyback:300] Out: 1150 [Urine:1150] Intake/Output this shift: Total I/O In: 360 [P.O.:360] Out: 2 [Urine:1; Stool:1]  Examination: General exam: Alert  awake oriented, chronically sick looking, not in acute distress.  HEENT:Oral mucosa moist, Ear/Nose WNL grossly,dentition normal. Respiratory system: bilaterally clear no use of accessory muscles no wheezing.   Cardiovascular system:S1 & S2 +,regular,No JVD. Gastrointestinal system:Abdomen soft, obese, nonspecific generalized tenderness.   Nervous System:Alert, awake, moving extremities but difficulty on the right leg 2/2 pain Extremities:Rt knee tender-chronic appearing erythematous skin changes hyperpigmentation on right more than left leg.  Distal pulses intact. Gouty deformities in the upper extremities. Skin:No rashes,no icterus. ZOX:WRUEAV muscle bulk,tone,power.  Data Reviewed: I have personally reviewed following labs and imaging studies CBC: Recent Labs  Lab 03/30/19 1451 03/30/19 1456 04/01/19 0305 04/02/19 0355 04/03/19 0358 04/05/19 0855 04/06/19 0654  WBC 28.6*   < > 24.7* 17.6* 18.2* 19.7* 23.6*  NEUTROABS 25.5*  --   --   --   --   --   --   HGB 15.2   < > 13.8 14.2 13.8 12.6* 12.7*  HCT 45.7   < > 41.5 44.2 42.4 38.7* 39.8  MCV 91.4   < > 92.6 94.6 94.0 94.4 94.3  PLT 454*   < > 275 265 289 316 334   < > = values in this interval not displayed.   Basic Metabolic Panel: Recent Labs  Lab 04/02/19 0355 04/02/19 0355 04/02/19 1842 04/03/19 0358 04/04/19 0542 04/05/19 0351 04/06/19 0654  NA 139   < > 134* 136 134* 134* 135  K 5.6*   < > 5.3* 5.1 4.9 4.7 4.8  CL 105   < > 104 107 108 107 108  CO2 25   < > 22 19* 17* 16* 17*  GLUCOSE 96   < > 138* 92 136* 259* 144*  BUN 45*   < > 38* 30* 29* 32* 36*  CREATININE 1.35*   < > 1.25* 1.22 1.28* 1.37* 1.41*  CALCIUM 8.9   < > 8.6* 8.9 8.6* 8.5* 9.0  MG 2.2  --   --  2.0 1.8 1.8 1.9  PHOS 3.5  --   --  3.4 4.2 3.9 4.0   < > =  values in this interval not displayed.   GFR: Estimated Creatinine Clearance: 80.5 mL/min (A) (by C-G formula based on SCr of 1.41 mg/dL (H)). Liver Function Tests: Recent Labs  Lab  03/30/19 1451 03/31/19 0411 04/02/19 0355 04/03/19 0358 04/04/19 0542 04/05/19 0351 04/06/19 0654  AST 40  --  33  --   --   --   --   ALT 37  --  32  --   --   --   --   ALKPHOS 224*  --  105  --   --   --   --   BILITOT 0.9  --  1.0  --   --   --   --   PROT 7.4  --  7.0  --   --   --   --   ALBUMIN 3.0*   < > 2.6*   2.5* 2.6* 2.4* 2.5* 2.6*   < > = values in this interval not displayed.   Recent Labs  Lab 03/30/19 1451 04/02/19 0355  LIPASE 40 55*   Recent Labs  Lab 03/30/19 1512  AMMONIA 31   Coagulation Profile: No results for input(s): INR, PROTIME in the last 168 hours. Cardiac Enzymes: No results for input(s): CKTOTAL, CKMB, CKMBINDEX, TROPONINI in the last 168 hours. BNP (last 3 results) No results for input(s): PROBNP in the last 8760 hours. HbA1C: No results for input(s): HGBA1C in the last 72 hours. CBG: Recent Labs  Lab 04/05/19 2024 04/05/19 2337 04/06/19 0448 04/06/19 0804 04/06/19 1110  GLUCAP 333* 193* 143* 121* 209*   Lipid Profile: No results for input(s): CHOL, HDL, LDLCALC, TRIG, CHOLHDL, LDLDIRECT in the last 72 hours. Thyroid Function Tests: No results for input(s): TSH, T4TOTAL, FREET4, T3FREE, THYROIDAB in the last 72 hours. Anemia Panel: No results for input(s): VITAMINB12, FOLATE, FERRITIN, TIBC, IRON, RETICCTPCT in the last 72 hours. Sepsis Labs: Recent Labs  Lab 03/30/19 1456 03/30/19 1813 03/30/19 1813 03/31/19 0222 04/01/19 0305 04/02/19 0355 04/06/19 0654  PROCALCITON  --  0.46   < > 0.53 0.41 0.29 <0.10  LATICACIDVEN 3.4* 2.8*  --   --   --   --   --    < > = values in this interval not displayed.    Recent Results (from the past 240 hour(s))  Respiratory Panel by RT PCR (Flu A&B, Covid) - Nasopharyngeal Swab     Status: None   Collection Time: 03/30/19  2:39 PM   Specimen: Nasopharyngeal Swab  Result Value Ref Range Status   SARS Coronavirus 2 by RT PCR NEGATIVE NEGATIVE Final    Comment: (NOTE) SARS-CoV-2  target nucleic acids are NOT DETECTED. The SARS-CoV-2 RNA is generally detectable in upper respiratoy specimens during the acute phase of infection. The lowest concentration of SARS-CoV-2 viral copies this assay can detect is 131 copies/mL. A negative result does not preclude SARS-Cov-2 infection and should not be used as the sole basis for treatment or other patient management decisions. A negative result may occur with  improper specimen collection/handling, submission of specimen other than nasopharyngeal swab, presence of viral mutation(s) within the areas targeted by this assay, and inadequate number of viral copies (<131 copies/mL). A negative result must be combined with clinical observations, patient history, and epidemiological information. The expected result is Negative. Fact Sheet for Patients:  PinkCheek.be Fact Sheet for Healthcare Providers:  GravelBags.it This test is not yet ap proved or cleared by the [place] and  has been authorized for  detection and/or diagnosis of SARS-CoV-2 by FDA under an Emergency Use Authorization (EUA). This EUA will remain  in effect (meaning this test can be used) for the duration of the COVID-19 declaration under Section 564(b)(1) of the Act, 21 U.S.C. section 360bbb-3(b)(1), unless the authorization is terminated or revoked sooner.    Influenza A by PCR NEGATIVE NEGATIVE Final   Influenza B by PCR NEGATIVE NEGATIVE Final    Comment: (NOTE) The Xpert Xpress SARS-CoV-2/FLU/RSV assay is intended as an aid in  the diagnosis of influenza from Nasopharyngeal swab specimens and  should not be used as a sole basis for treatment. Nasal washings and  aspirates are unacceptable for Xpert Xpress SARS-CoV-2/FLU/RSV  testing. Fact Sheet for Patients: PinkCheek.be Fact Sheet for Healthcare Providers: GravelBags.it This test is  not yet approved or cleared by the [place] FDA and  has been authorized for detection and/or diagnosis of SARS-CoV-2 by  FDA under an Emergency Use Authorization (EUA). This EUA will remain  in effect (meaning this test can be used) for the duration of the  Covid-19 declaration under Section 564(b)(1) of the Act, 21  U.S.C. section 360bbb-3(b)(1), unless the authorization is  terminated or revoked. Performed at [facility], [place] [address] [place], [place] [address]   Culture, blood (routine x 2)     Status: None   Collection Time: 03/30/19  2:50 PM   Specimen: BLOOD  Result Value Ref Range Status   Specimen Description BLOOD RIGHT ANTECUBITAL  Final   Special Requests   Final    BOTTLES DRAWN AEROBIC AND ANAEROBIC Blood Culture adequate volume   Culture   Final    NO GROWTH 5 DAYS Performed at [facility], [place] [address], [place], [place] [address]    Report Status 04/04/2019 FINAL  Final  Culture, blood (routine x 2)     Status: None   Collection Time: 03/30/19  4:47 PM   Specimen: BLOOD LEFT FOREARM  Result Value Ref Range Status   Specimen Description BLOOD LEFT FOREARM  Final   Special Requests   Final    BOTTLES DRAWN AEROBIC AND ANAEROBIC Blood Culture adequate volume   Culture   Final    NO GROWTH 5 DAYS Performed at [facility], [place] [address], [place], [place] [address]    Report Status 04/04/2019 FINAL  Final  MRSA PCR Screening     Status: Abnormal   Collection Time: 03/30/19  4:54 PM   Specimen: Nasopharyngeal  Result Value Ref Range Status   MRSA by PCR POSITIVE (A) NEGATIVE Final    Comment:        The GeneXpert MRSA Assay (FDA approved for NASAL specimens only), is one component of a comprehensive MRSA colonization surveillance program. It is not intended to diagnose MRSA infection nor to guide or monitor treatment for MRSA infections. RESULT CALLED TO, READ BACK BY AND VERIFIED WITH: R ROBERTS RN 03/30/19 1853 JDW Performed  at [facility], [place] [address] [place], [place] [address]       Radiology Studies: DG Swallowing Func-Speech Pathology  Result Date: 04/05/2019 Objective Swallowing Evaluation: Type of Study: MBS-Modified Barium Swallow Study  Patient Details Name: Abdulahad Mederos MRN: [id number] Date of Birth: 11/30/1965 Today's Date: 04/05/2019 Time: SLP Start Time (ACUTE ONLY): 0945 -SLP Stop Time (ACUTE ONLY): 1000 SLP Time Calculation (min) (ACUTE ONLY): 15 min Past Medical History: Past Medical History: Diagnosis Date  CVA (cerebral vascular accident) ([place])   Gout  Hypertension   MGUS (monoclonal gammopathy of unknown significance)   Obesity   OSA on CPAP   Pulmonary embolus ([place]) 05/2012  During recovery from MI, completed Coumadin  STEMI (ST elevation myocardial infarction) ([place]) 04/2012  Integrity BMS RCA with minor left system disease Past Surgical History: Past Surgical History: Procedure Laterality Date  CORONARY ANGIOPLASTY WITH STENT PLACEMENT  04/2012  Integrity BMS RCA, minor L system dz  LEFT HEART CATH AND CORONARY ANGIOGRAPHY N/A 03/31/2019  Procedure: LEFT HEART CATH AND CORONARY ANGIOGRAPHY;  Surgeon: Leonie Man, MD;  Location: [facility];  Service: Cardiovascular;  Laterality: N/A;  RADIOLOGY WITH ANESTHESIA N/A 09/16/2013  Procedure: RADIOLOGY WITH ANESTHESIA;  Surgeon: Rob Hickman, MD;  Location: [facility];  Service: Radiology;  Laterality: N/A; HPI: 55 year old male with past medical history significant for CAD, hypertension, and prior CVA, admitted with complaints of chest pain as a code STEMI found to have hyperkalemia, acute renal failure, and DKA. Pt uinderwent LHC on 3/24 with recommendation for medical management. Pt has had c/o liquids > solids "pooling" in his throat, therefore SLP swallow evaluation was ordered.  Subjective: alert, pleasant, feels like he is having the most trouble with thin liquids Assessment / Plan / Recommendation CHL IP CLINICAL IMPRESSIONS  04/05/2019 Clinical Impression Pt demonstrates a moderate pharyngeal dysphagia with an anatomical variant that may be impacting epiglottic deflection and laryngeal closure. Pt is noted to have bilateral projections between the epiglottis and arytenoids, suspected to be protrusions of the superior horn of the thyroid cartilage. Barium travels around these structures, however, pt has no deflection of the epiglottis despite othewise adequate movement of hyoid excursion and laryngeal elevation. Base of tongue retarction could be better, but is not the primary deficit. Lack of epiglottic deflection causes consistent high penetration of liquids and impacts clearance of solids with mild to moderate pharyngeal residue. Pt is able to protect his airway with strong vestibular closure, but occasionally needs a cough or throat clear to fully eject penetrate post swallow. Pt needed cueing to learn a throat clear, which can be more efficient in clearing the vestibule than a cough.  A chin tuck is beneficial to decrease penetration. Thickened liquids were also penetrated so there is minimal benefit in thickening unless pt feels increased comfort. Will f/u with direct imaging of the larynx via FEES to further visualize pts anatomy and impact on swallow function.  SLP Visit Diagnosis Dysphagia, unspecified (R13.10) Attention and concentration deficit following -- Frontal lobe and executive function deficit following -- Impact on safety and function Mild aspiration risk;Moderate aspiration risk   CHL IP TREATMENT RECOMMENDATION 04/05/2019 Treatment Recommendations F/U FEES in --- days (Comment)   Prognosis 04/05/2019 Prognosis for Safe Diet Advancement Good Barriers to Reach Goals -- Barriers/Prognosis Comment -- CHL IP DIET RECOMMENDATION 04/05/2019 SLP Diet Recommendations Dysphagia 3 (Mech soft) solids;Thin liquid Liquid Administration via Straw Medication Administration Whole meds with puree Compensations Clear throat after each  swallow;Follow solids with liquid;Chin tuck;Use straw to facilitate chin tuck Postural Changes Seated upright at 90 degrees   CHL IP OTHER RECOMMENDATIONS 04/05/2019 Recommended Consults Consider ENT evaluation Oral Care Recommendations Oral care BID Other Recommendations --   CHL IP FOLLOW UP RECOMMENDATIONS 04/05/2019 Follow up Recommendations Other (comment)   CHL IP FREQUENCY AND DURATION 04/05/2019 Speech Therapy Frequency (ACUTE ONLY) min 2x/week Treatment Duration 2 weeks      CHL IP ORAL PHASE 04/05/2019 Oral Phase WFL Oral - Pudding Teaspoon -- Oral - Pudding Cup --  Oral - Honey Teaspoon -- Oral - Honey Cup -- Oral - Nectar Teaspoon -- Oral - Nectar Cup WFL;Lingual/palatal residue Oral - Nectar Straw WFL;Lingual/palatal residue Oral - Thin Teaspoon -- Oral - Thin Cup [facility];Lingual/palatal residue Oral - Thin Straw WFL;Lingual/palatal residue Oral - Puree WFL;Lingual/palatal residue Oral - Mech Soft -- Oral - Regular WFL;Lingual/palatal residue Oral - Multi-Consistency -- Oral - Pill -- Oral Phase - Comment --  CHL IP PHARYNGEAL PHASE 04/05/2019 Pharyngeal Phase Impaired Pharyngeal- Pudding Teaspoon -- Pharyngeal -- Pharyngeal- Pudding Cup -- Pharyngeal -- Pharyngeal- Honey Teaspoon -- Pharyngeal -- Pharyngeal- Honey Cup -- Pharyngeal -- Pharyngeal- Nectar Teaspoon -- Pharyngeal -- Pharyngeal- Nectar Cup Reduced epiglottic inversion;Reduced airway/laryngeal closure;Reduced tongue base retraction;Penetration/Aspiration during swallow;Penetration/Apiration after swallow;Trace aspiration;Pharyngeal residue - pyriform;Delayed swallow initiation-pyriform sinuses Pharyngeal Material enters airway, remains ABOVE vocal cords then ejected out;Material enters airway, CONTACTS cords and not ejected out;Material enters airway, CONTACTS cords and then ejected out Pharyngeal- Nectar Straw Reduced epiglottic inversion;Reduced airway/laryngeal closure;Reduced tongue base retraction;Pharyngeal residue - pyriform;Delayed swallow  initiation-pyriform sinuses;Compensatory strategies attempted (with notebox);Penetration/Aspiration during swallow Pharyngeal Material enters airway, remains ABOVE vocal cords then ejected out Pharyngeal- Thin Teaspoon -- Pharyngeal -- Pharyngeal- Thin Cup Reduced epiglottic inversion;Reduced airway/laryngeal closure;Reduced tongue base retraction;Penetration/Aspiration during swallow;Penetration/Apiration after swallow;Trace aspiration;Pharyngeal residue - pyriform;Delayed swallow initiation-pyriform sinuses Pharyngeal Material enters airway, passes BELOW cords then ejected out;Material enters airway, remains ABOVE vocal cords then ejected out;Material does not enter airway Pharyngeal- Thin Straw Reduced epiglottic inversion;Reduced airway/laryngeal closure;Reduced tongue base retraction;Pharyngeal residue - pyriform;Delayed swallow initiation-pyriform sinuses;Compensatory strategies attempted (with notebox);Penetration/Aspiration during swallow Pharyngeal -- Pharyngeal- Puree Reduced epiglottic inversion;Delayed swallow initiation-vallecula;Pharyngeal residue - pyriform Pharyngeal -- Pharyngeal- Mechanical Soft -- Pharyngeal -- Pharyngeal- Regular Delayed swallow initiation-vallecula;Reduced epiglottic inversion;Compensatory strategies attempted (with notebox) Pharyngeal -- Pharyngeal- Multi-consistency -- Pharyngeal -- Pharyngeal- Pill -- Pharyngeal -- Pharyngeal Comment --  No flowsheet data found. Herbie Baltimore, MA CCC-SLP Acute Rehabilitation Services Pager [phone] Office [phone] Lynann Beaver 04/05/2019, 11:34 AM              DG ESOPHAGUS W SINGLE CM (SOL OR THIN BA)  Result Date: 04/05/2019 CLINICAL DATA:  Food getting stuck.  Dysphagia. EXAM: ESOPHOGRAM/BARIUM SWALLOW TECHNIQUE: Single contrast examination was performed using  thin barium. FLUOROSCOPY TIME:  Fluoroscopy Time:  1 minutes 24 seconds Radiation Exposure Index (if provided by the fluoroscopic device): Number of Acquired  Spot Images: 0 COMPARISON:  None. FINDINGS: No aspiration identified. Moderate esophageal dysmotility. Barium passes readily into the stomach without delay. No esophageal dilatation. No stricture or mass. No hiatal hernia identified. IMPRESSION: Esophageal dysmotility.  No stricture or mass identified. Electronically Signed   By: Franchot Gallo M.D.   On: 04/05/2019 11:10     LOS: 7 days   Time spent: More than 50% of that time was spent in counseling and/or coordination of care.  Antonieta Pert, MD [facility]  04/06/2019, 2:05 PM

## 2019-04-06 NOTE — Consult Note (Signed)
Reason for Consult: Evaluate patient with dysphagia Referring Physician: Hospitalist  Jesus Cruz is an 54 y.o. male.  HPI: Patient was admitted about a week ago because of cardiac problems.  He had previous stent placed.  He had history of pulmonary embolism and is on blood thinner.  But a couple of days prior to his recent admission he was having some trouble swallowing.  Since his admission he had a barium swallow which showed no stricture or obstruction of the esophagus.  Patient describes sensation of tightness in his throat and points to the area of the cricoid laryngeal cartilage.  He is also had a little bit of hoarseness.  He does smoke about a half a pack a day.  He continues to complain of difficulty swallowing and was seen by speech therapy for assistance in swallowing.  They recommend ENT evaluation.  His barium swallow showed dysmotility disorder. On discussion with the patient his voice is reasonably good although he says it is not quite normal.  And when he points to the area of discomfort he points to midline about cricoid cartilage level.  Past Medical History:  Diagnosis Date  . CVA (cerebral vascular accident) (Jesus Cruz)   . Gout   . Hypertension   . MGUS (monoclonal gammopathy of unknown significance)   . Obesity   . OSA on CPAP   . Pulmonary embolus (Jesus Cruz) 05/2012   During recovery from MI, completed Coumadin  . Stage 3 chronic kidney disease   . STEMI (ST elevation myocardial infarction) (Jesus Cruz) 04/2012   Integrity BMS RCA with minor left system disease    Past Surgical History:  Procedure Laterality Date  . CORONARY ANGIOPLASTY WITH STENT PLACEMENT  04/2012   Integrity BMS RCA, minor L system dz  . LEFT HEART CATH AND CORONARY ANGIOGRAPHY N/A 03/31/2019   Procedure: LEFT HEART CATH AND CORONARY ANGIOGRAPHY;  Surgeon: Jesus Man, MD;  Location: [facility];  Service: Cardiovascular;  Laterality: N/A;  . RADIOLOGY WITH ANESTHESIA N/A 09/16/2013   Procedure:  RADIOLOGY WITH ANESTHESIA;  Surgeon: Jesus Hickman, MD;  Location: [facility];  Service: Radiology;  Laterality: N/A;    Social History:  reports that he has been smoking cigarettes. He has never used smokeless tobacco. He reports that he does not drink alcohol or use drugs.  Allergies:  Allergies  Allergen Reactions  . Other Nausea And Vomiting    Mayonnaise      Medications: I have reviewed the patient's current medications.  Results for orders placed or performed during the hospital encounter of 03/30/19 (from the past 48 hour(s))  Glucose, capillary     Status: Abnormal   Collection Time: 04/04/19 12:07 PM  Result Value Ref Range   Glucose-Capillary 195 (H) 70 - 99 mg/dL    Comment: Glucose reference range applies only to samples taken after fasting for at least 8 hours.  Glucose, capillary     Status: Abnormal   Collection Time: 04/04/19  4:55 PM  Result Value Ref Range   Glucose-Capillary 278 (H) 70 - 99 mg/dL    Comment: Glucose reference range applies only to samples taken after fasting for at least 8 hours.  Glucose, capillary     Status: Abnormal   Collection Time: 04/04/19  8:06 PM  Result Value Ref Range   Glucose-Capillary 238 (H) 70 - 99 mg/dL    Comment: Glucose reference range applies only to samples taken after fasting for at least 8 hours.  Glucose, capillary  Status: Abnormal   Collection Time: 04/05/19 12:24 AM  Result Value Ref Range   Glucose-Capillary 171 (H) 70 - 99 mg/dL    Comment: Glucose reference range applies only to samples taken after fasting for at least 8 hours.  Glucose, capillary     Status: Abnormal   Collection Time: 04/05/19  3:40 AM  Result Value Ref Range   Glucose-Capillary 279 (H) 70 - 99 mg/dL    Comment: Glucose reference range applies only to samples taken after fasting for at least 8 hours.  Renal function panel     Status: Abnormal   Collection Time: 04/05/19  3:51 AM  Result Value Ref Range   Sodium 134 (L) 135 - 145  mmol/L   Potassium 4.7 3.5 - 5.1 mmol/L   Chloride 107 98 - 111 mmol/L   CO2 16 (L) 22 - 32 mmol/L   Glucose, Bld 259 (H) 70 - 99 mg/dL    Comment: Glucose reference range applies only to samples taken after fasting for at least 8 hours.   BUN 32 (H) 6 - 20 mg/dL   Creatinine, Ser 1.37 (H) 0.61 - 1.24 mg/dL   Calcium 8.5 (L) 8.9 - 10.3 mg/dL   Phosphorus 3.9 2.5 - 4.6 mg/dL   Albumin 2.5 (L) 3.5 - 5.0 g/dL   GFR calc non Af Amer 58 (L) >60 mL/min   GFR calc Af Amer >60 >60 mL/min   Anion gap 11 5 - 15    Comment: Performed at [facility] [address], [place], [place] [address]  Magnesium     Status: None   Collection Time: 04/05/19  3:51 AM  Result Value Ref Range   Magnesium 1.8 1.7 - 2.4 mg/dL    Comment: Performed at [facility] [address] [place], [place] [address]  Glucose, capillary     Status: Abnormal   Collection Time: 04/05/19  7:53 AM  Result Value Ref Range   Glucose-Capillary 169 (H) 70 - 99 mg/dL    Comment: Glucose reference range applies only to samples taken after fasting for at least 8 hours.   Comment 1 Notify RN    Comment 2 Document in Chart   CBC     Status: Abnormal   Collection Time: 04/05/19  8:55 AM  Result Value Ref Range   WBC 19.7 (H) 4.0 - 10.5 K/uL   RBC 4.10 (L) 4.22 - 5.81 MIL/uL   Hemoglobin 12.6 (L) 13.0 - 17.0 g/dL   HCT 38.7 (L) 39.0 - 52.0 %   MCV 94.4 80.0 - 100.0 fL   MCH 30.7 26.0 - 34.0 pg   MCHC 32.6 30.0 - 36.0 g/dL   RDW 12.7 11.5 - 15.5 %   Platelets 316 150 - 400 K/uL   nRBC 0.0 0.0 - 0.2 %    Comment: Performed at [facility], [place] [address] [place], [place] [address]  Glucose, capillary     Status: Abnormal   Collection Time: 04/05/19 12:41 PM  Result Value Ref Range   Glucose-Capillary 157 (H) 70 - 99 mg/dL    Comment: Glucose reference range applies only to samples taken after fasting for at least 8 hours.   Comment 1 Notify RN    Comment 2 Document in Chart   Glucose, capillary      Status: Abnormal   Collection Time: 04/05/19  5:15 PM  Result Value Ref Range   Glucose-Capillary 308 (H) 70 - 99 mg/dL  Comment: Glucose reference range applies only to samples taken after fasting for at least 8 hours.   Comment 1 Notify RN    Comment 2 Document in Chart   Glucose, capillary     Status: Abnormal   Collection Time: 04/05/19  8:24 PM  Result Value Ref Range   Glucose-Capillary 333 (H) 70 - 99 mg/dL    Comment: Glucose reference range applies only to samples taken after fasting for at least 8 hours.  Glucose, capillary     Status: Abnormal   Collection Time: 04/05/19 11:37 PM  Result Value Ref Range   Glucose-Capillary 193 (H) 70 - 99 mg/dL    Comment: Glucose reference range applies only to samples taken after fasting for at least 8 hours.  Glucose, capillary     Status: Abnormal   Collection Time: 04/06/19  4:48 AM  Result Value Ref Range   Glucose-Capillary 143 (H) 70 - 99 mg/dL    Comment: Glucose reference range applies only to samples taken after fasting for at least 8 hours.  Magnesium     Status: None   Collection Time: 04/06/19  6:54 AM  Result Value Ref Range   Magnesium 1.9 1.7 - 2.4 mg/dL    Comment: Performed at [facility] [address] [place], [place] [address]  Renal function panel     Status: Abnormal   Collection Time: 04/06/19  6:54 AM  Result Value Ref Range   Sodium 135 135 - 145 mmol/L   Potassium 4.8 3.5 - 5.1 mmol/L   Chloride 108 98 - 111 mmol/L   CO2 17 (L) 22 - 32 mmol/L   Glucose, Bld 144 (H) 70 - 99 mg/dL    Comment: Glucose reference range applies only to samples taken after fasting for at least 8 hours.   BUN 36 (H) 6 - 20 mg/dL   Creatinine, Ser 1.41 (H) 0.61 - 1.24 mg/dL   Calcium 9.0 8.9 - 10.3 mg/dL   Phosphorus 4.0 2.5 - 4.6 mg/dL   Albumin 2.6 (L) 3.5 - 5.0 g/dL   GFR calc non Af Amer 56 (L) >60 mL/min   GFR calc Af Amer >60 >60 mL/min   Anion gap 10 5 - 15    Comment: Performed at [facility] [address] [place], [place] [address]  CBC     Status: Abnormal   Collection Time: 04/06/19  6:54 AM  Result Value Ref Range   WBC 23.6 (H) 4.0 - 10.5 K/uL   RBC 4.22 4.22 - 5.81 MIL/uL   Hemoglobin 12.7 (L) 13.0 - 17.0 g/dL   HCT 39.8 39.0 - 52.0 %   MCV 94.3 80.0 - 100.0 fL   MCH 30.1 26.0 - 34.0 pg   MCHC 31.9 30.0 - 36.0 g/dL   RDW 12.9 11.5 - 15.5 %   Platelets 334 150 - 400 K/uL   nRBC 0.0 0.0 - 0.2 %    Comment: Performed at [facility], [place] [address], [place],  [address]  Procalcitonin - Baseline     Status: None   Collection Time: 04/06/19  6:54 AM  Result Value Ref Range   Procalcitonin <0.10 ng/mL    Comment:        Interpretation: PCT (Procalcitonin) <= 0.5 ng/mL: Systemic infection (sepsis) is not likely. Local bacterial infection is possible. (NOTE)       Sepsis PCT Algorithm           Lower Respiratory Tract  Infection PCT Algorithm    ----------------------------     ----------------------------         PCT < 0.25 ng/mL                PCT < 0.10 ng/mL         Strongly encourage             Strongly discourage   discontinuation of antibiotics    initiation of antibiotics    ----------------------------     -----------------------------       PCT 0.25 - 0.50 ng/mL            PCT 0.10 - 0.25 ng/mL               OR       >80% decrease in PCT            Discourage initiation of                                            antibiotics      Encourage discontinuation           of antibiotics    ----------------------------     -----------------------------         PCT >= 0.50 ng/mL              PCT 0.26 - 0.50 ng/mL               AND        <80% decrease in PCT             Encourage initiation of                                             antibiotics       Encourage continuation           of antibiotics    ----------------------------     -----------------------------        PCT >= 0.50 ng/mL                  PCT  > 0.50 ng/mL               AND         increase in PCT                  Strongly encourage                                      initiation of antibiotics    Strongly encourage escalation           of antibiotics                                     -----------------------------                                           PCT <= 0.25 ng/mL  OR                                        > 80% decrease in PCT                                     Discontinue / Do not initiate                                             antibiotics Performed at [facility], [place] [address], [place], [place] [address]   Glucose, capillary     Status: Abnormal   Collection Time: 04/06/19  8:04 AM  Result Value Ref Range   Glucose-Capillary 121 (H) 70 - 99 mg/dL    Comment: Glucose reference range applies only to samples taken after fasting for at least 8 hours.   Comment 1 Notify RN    Comment 2 Document in Chart   Glucose, capillary     Status: Abnormal   Collection Time: 04/06/19 11:10 AM  Result Value Ref Range   Glucose-Capillary 209 (H) 70 - 99 mg/dL    Comment: Glucose reference range applies only to samples taken after fasting for at least 8 hours.   Comment 1 Notify RN    Comment 2 Document in Chart     DG Swallowing Func-Speech Pathology  Result Date: 04/05/2019 Objective Swallowing Evaluation: Type of Study: MBS-Modified Barium Swallow Study  Patient Details Name: Jesus Cruz MRN: [id number] Date of Birth: 1965-10-03 Today's Date: 04/05/2019 Time: SLP Start Time (ACUTE ONLY): 0945 -SLP Stop Time (ACUTE ONLY): 1000 SLP Time Calculation (min) (ACUTE ONLY): 15 min Past Medical History: Past Medical History: Diagnosis Date . CVA (cerebral vascular accident) ([place])  . Gout  . Hypertension  . MGUS (monoclonal gammopathy of unknown significance)  . Obesity  . OSA on CPAP  . Pulmonary embolus ([place]) 05/2012  During recovery from MI, completed Coumadin . STEMI (ST  elevation myocardial infarction) ([place]) 04/2012  Integrity BMS RCA with minor left system disease Past Surgical History: Past Surgical History: Procedure Laterality Date . CORONARY ANGIOPLASTY WITH STENT PLACEMENT  04/2012  Integrity BMS RCA, minor L system dz . LEFT HEART CATH AND CORONARY ANGIOGRAPHY N/A 03/31/2019  Procedure: LEFT HEART CATH AND CORONARY ANGIOGRAPHY;  Surgeon: Jesus Man, MD;  Location: [facility];  Service: Cardiovascular;  Laterality: N/A; . RADIOLOGY WITH ANESTHESIA N/A 09/16/2013  Procedure: RADIOLOGY WITH ANESTHESIA;  Surgeon: Jesus Hickman, MD;  Location: [facility];  Service: Radiology;  Laterality: N/A; HPI: 54 year old male with past medical history significant for CAD, hypertension, and prior CVA, admitted with complaints of chest pain as a code STEMI found to have hyperkalemia, acute renal failure, and DKA. Pt uinderwent [facility] on 3/24 with recommendation for medical management. Pt has had c/o liquids > solids "pooling" in his throat, therefore SLP swallow evaluation was ordered.  Subjective: alert, pleasant, feels like he is having the most trouble with thin liquids Assessment / Plan / Recommendation CHL IP CLINICAL IMPRESSIONS 04/05/2019 Clinical Impression Pt demonstrates a moderate pharyngeal dysphagia with an anatomical variant that may be impacting epiglottic deflection and laryngeal closure. Pt is noted to have bilateral projections between the  epiglottis and arytenoids, suspected to be protrusions of the superior horn of the thyroid cartilage. Barium travels around these structures, however, pt has no deflection of the epiglottis despite othewise adequate movement of hyoid excursion and laryngeal elevation. Base of tongue retarction could be better, but is not the primary deficit. Lack of epiglottic deflection causes consistent high penetration of liquids and impacts clearance of solids with mild to moderate pharyngeal residue. Pt is able to protect his airway with  strong vestibular closure, but occasionally needs a cough or throat clear to fully eject penetrate post swallow. Pt needed cueing to learn a throat clear, which can be more efficient in clearing the vestibule than a cough.  A chin tuck is beneficial to decrease penetration. Thickened liquids were also penetrated so there is minimal benefit in thickening unless pt feels increased comfort. Will f/u with direct imaging of the larynx via FEES to further visualize pts anatomy and impact on swallow function.  SLP Visit Diagnosis Dysphagia, unspecified (R13.10) Attention and concentration deficit following -- Frontal lobe and executive function deficit following -- Impact on safety and function Mild aspiration risk;Moderate aspiration risk   CHL IP TREATMENT RECOMMENDATION 04/05/2019 Treatment Recommendations F/U FEES in --- days (Comment)   Prognosis 04/05/2019 Prognosis for Safe Diet Advancement Good Barriers to Reach Goals -- Barriers/Prognosis Comment -- CHL IP DIET RECOMMENDATION 04/05/2019 SLP Diet Recommendations Dysphagia 3 (Mech soft) solids;Thin liquid Liquid Administration via Straw Medication Administration Whole meds with puree Compensations Clear throat after each swallow;Follow solids with liquid;Chin tuck;Use straw to facilitate chin tuck Postural Changes Seated upright at 90 degrees   CHL IP OTHER RECOMMENDATIONS 04/05/2019 Recommended Consults Consider ENT evaluation Oral Care Recommendations Oral care BID Other Recommendations --   CHL IP FOLLOW UP RECOMMENDATIONS 04/05/2019 Follow up Recommendations Other (comment)   CHL IP FREQUENCY AND DURATION 04/05/2019 Speech Therapy Frequency (ACUTE ONLY) min 2x/week Treatment Duration 2 weeks      CHL IP ORAL PHASE 04/05/2019 Oral Phase WFL Oral - Pudding Teaspoon -- Oral - Pudding Cup -- Oral - Honey Teaspoon -- Oral - Honey Cup -- Oral - Nectar Teaspoon -- Oral - Nectar Cup WFL;Lingual/palatal residue Oral - Nectar Straw WFL;Lingual/palatal residue Oral - Thin  Teaspoon -- Oral - Thin Cup [facility];Lingual/palatal residue Oral - Thin Straw WFL;Lingual/palatal residue Oral - Puree WFL;Lingual/palatal residue Oral - Mech Soft -- Oral - Regular WFL;Lingual/palatal residue Oral - Multi-Consistency -- Oral - Pill -- Oral Phase - Comment --  CHL IP PHARYNGEAL PHASE 04/05/2019 Pharyngeal Phase Impaired Pharyngeal- Pudding Teaspoon -- Pharyngeal -- Pharyngeal- Pudding Cup -- Pharyngeal -- Pharyngeal- Honey Teaspoon -- Pharyngeal -- Pharyngeal- Honey Cup -- Pharyngeal -- Pharyngeal- Nectar Teaspoon -- Pharyngeal -- Pharyngeal- Nectar Cup Reduced epiglottic inversion;Reduced airway/laryngeal closure;Reduced tongue base retraction;Penetration/Aspiration during swallow;Penetration/Apiration after swallow;Trace aspiration;Pharyngeal residue - pyriform;Delayed swallow initiation-pyriform sinuses Pharyngeal Material enters airway, remains ABOVE vocal cords then ejected out;Material enters airway, CONTACTS cords and not ejected out;Material enters airway, CONTACTS cords and then ejected out Pharyngeal- Nectar Straw Reduced epiglottic inversion;Reduced airway/laryngeal closure;Reduced tongue base retraction;Pharyngeal residue - pyriform;Delayed swallow initiation-pyriform sinuses;Compensatory strategies attempted (with notebox);Penetration/Aspiration during swallow Pharyngeal Material enters airway, remains ABOVE vocal cords then ejected out Pharyngeal- Thin Teaspoon -- Pharyngeal -- Pharyngeal- Thin Cup Reduced epiglottic inversion;Reduced airway/laryngeal closure;Reduced tongue base retraction;Penetration/Aspiration during swallow;Penetration/Apiration after swallow;Trace aspiration;Pharyngeal residue - pyriform;Delayed swallow initiation-pyriform sinuses Pharyngeal Material enters airway, passes BELOW cords then ejected out;Material enters airway, remains ABOVE vocal cords then ejected out;Material does not enter airway Pharyngeal- Thin Straw Reduced  epiglottic inversion;Reduced  airway/laryngeal closure;Reduced tongue base retraction;Pharyngeal residue - pyriform;Delayed swallow initiation-pyriform sinuses;Compensatory strategies attempted (with notebox);Penetration/Aspiration during swallow Pharyngeal -- Pharyngeal- Puree Reduced epiglottic inversion;Delayed swallow initiation-vallecula;Pharyngeal residue - pyriform Pharyngeal -- Pharyngeal- Mechanical Soft -- Pharyngeal -- Pharyngeal- Regular Delayed swallow initiation-vallecula;Reduced epiglottic inversion;Compensatory strategies attempted (with notebox) Pharyngeal -- Pharyngeal- Multi-consistency -- Pharyngeal -- Pharyngeal- Pill -- Pharyngeal -- Pharyngeal Comment --  No flowsheet data found. Herbie Baltimore, MA CCC-SLP Acute Rehabilitation Services Pager [phone] Office [phone] Lynann Beaver 04/05/2019, 11:34 AM              DG ESOPHAGUS W SINGLE CM (SOL OR THIN BA)  Result Date: 04/05/2019 CLINICAL DATA:  Food getting stuck.  Dysphagia. EXAM: ESOPHOGRAM/BARIUM SWALLOW TECHNIQUE: Single contrast examination was performed using  thin barium. FLUOROSCOPY TIME:  Fluoroscopy Time:  1 minutes 24 seconds Radiation Exposure Index (if provided by the fluoroscopic device): Number of Acquired Spot Images: 0 COMPARISON:  None. FINDINGS: No aspiration identified. Moderate esophageal dysmotility. Barium passes readily into the stomach without delay. No esophageal dilatation. No stricture or mass. No hiatal hernia identified. IMPRESSION: Esophageal dysmotility.  No stricture or mass identified. Electronically Signed   By: Franchot Gallo M.D.   On: 04/05/2019 11:10    ROS: No airway problems.   PE: Nasal exam reveals clear nasal passages bilaterally. Oral exam revealed clear oropharynx.  Palpation of the base of tongue was soft. Fiberoptic laryngoscopy was performed through the left nostril and on fiberoptic laryngoscopy the nasopharynx was clear.  The base of tongue vallecula epiglottis were normal.  No anatomical  abnormalities noted.  Vocal cords were clear with normal vocal cord mobility.  No vocal cord pathology noted.  Both piriform sinuses were clear.  I was able to pass the fiberoptic laryngoscope through the upper esophageal sphincter without difficulty and the upper cervical esophagus appeared clear with no obvious mucosal abnormalities noted. Neck exam revealed no palpable adenopathy or masses in the neck or around the thyroid region.  Assessment/Plan: Dysphagia with no apparent structural abnormalities noted on barium swallow or fiberoptic laryngoscopy. Patient may have some degree of laryngeal pharyngeal reflux which may be contributing some to his symptoms and might benefit from use of omeprazole or similar antacid daily or before dinner to cover nighttime reflux. Otherwise would recommend speech language pathology for assistance in swallowing as presently undergoing.  Melony Overly 04/06/2019, 12:01 PM

## 2019-04-06 NOTE — Progress Notes (Signed)
I reviewed his barium swallow and modified barium swallow.  He does have oropharyngeal dysphagia.  Findings on modified barium swallow are revealing.  He does have a esophageal dysmotility.  I do not think there is anything specifically we can do from a GI standpoint.  There is no stricture to dilate.  He will probably benefit from counseling with speech pathology.  We will sign off.  Call us if needed.

## 2019-04-07 LAB — GLUCOSE, CAPILLARY
Glucose-Capillary: 113 mg/dL — ABNORMAL HIGH (ref 70–99)
Glucose-Capillary: 95 mg/dL (ref 70–99)
Glucose-Capillary: 217 mg/dL — ABNORMAL HIGH (ref 70–99)
Glucose-Capillary: 172 mg/dL — ABNORMAL HIGH (ref 70–99)

## 2019-04-07 LAB — RENAL FUNCTION PANEL
Albumin: 2.6 g/dL — ABNORMAL LOW (ref 3.5–5.0)
Anion gap: 5 (ref 5–15)
BUN: 33 mg/dL — ABNORMAL HIGH (ref 6–20)
CO2: 20 mmol/L — ABNORMAL LOW (ref 22–32)
Calcium: 8.4 mg/dL — ABNORMAL LOW (ref 8.9–10.3)
Chloride: 107 mmol/L (ref 98–111)
Creatinine, Ser: 1.31 mg/dL — ABNORMAL HIGH (ref 0.61–1.24)
GFR calc Af Amer: >60 mL/min (ref >60)
GFR calc non Af Amer: >60 mL/min (ref >60)
Glucose, Bld: 156 mg/dL — ABNORMAL HIGH (ref 70–99)
Phosphorus: 4.3 mg/dL (ref 2.5–4.6)
Potassium: 5.5 mmol/L — ABNORMAL HIGH (ref 3.5–5.1)
Sodium: 132 mmol/L — ABNORMAL LOW (ref 135–145)

## 2019-04-07 LAB — CBC
HCT: 37.3 % — ABNORMAL LOW (ref 39.0–52.0)
Hemoglobin: 11.9 g/dL — ABNORMAL LOW (ref 13.0–17.0)
MCH: 30.4 pg (ref 26.0–34.0)
MCHC: 31.9 g/dL (ref 30.0–36.0)
MCV: 95.4 fL (ref 80.0–100.0)
Platelets: 299 10*3/uL (ref 150–400)
RBC: 3.91 MIL/uL — ABNORMAL LOW (ref 4.22–5.81)
RDW: 13 % (ref 11.5–15.5)
WBC: 20.2 10*3/uL — ABNORMAL HIGH (ref 4.0–10.5)
nRBC: 0 % (ref 0.0–0.2)

## 2019-04-07 LAB — PROCALCITONIN: Procalcitonin: <0.1 ng/mL

## 2019-04-07 LAB — POTASSIUM: Potassium: 4.5 mmol/L (ref 3.5–5.1)

## 2019-04-07 LAB — MAGNESIUM: Magnesium: 1.9 mg/dL (ref 1.7–2.4)

## 2019-04-07 MED ORDER — METOPROLOL SUCCINATE ER 50 MG PO TB24: 150.0000 mg | ORAL_TABLET | Freq: Every day | ORAL | 0 refills | Status: DC

## 2019-04-07 MED ORDER — SODIUM ZIRCONIUM CYCLOSILICATE 10 G PO PACK
10.0000 g | PACK | Freq: Once | ORAL | Status: AC
  Administered 2019-04-07: 10 g via ORAL
  Filled 2019-04-07: qty 1

## 2019-04-07 MED ORDER — BLOOD GLUCOSE METER KIT: PACK | 0 refills | Status: AC

## 2019-04-07 MED ORDER — INSULIN DETEMIR 100 UNIT/ML FLEXPEN: 20.0000 [IU] | PEN_INJECTOR | Freq: Every day | SUBCUTANEOUS | 1 refills | Status: DC

## 2019-04-07 MED ORDER — METOCLOPRAMIDE HCL 5 MG PO TABS: 5.0000 mg | ORAL_TABLET | Freq: Three times a day (TID) | ORAL | 0 refills | Status: DC

## 2019-04-07 MED ORDER — PANTOPRAZOLE SODIUM 40 MG PO TBEC: 40.0000 mg | DELAYED_RELEASE_TABLET | Freq: Two times a day (BID) | ORAL | 1 refills | Status: AC

## 2019-04-07 MED ORDER — BENZONATATE 200 MG PO CAPS: 200.0000 mg | ORAL_CAPSULE | Freq: Three times a day (TID) | ORAL | 0 refills | Status: DC | PRN

## 2019-04-07 MED ORDER — GLIMEPIRIDE 4 MG PO TABS: 4.0000 mg | ORAL_TABLET | Freq: Every day | ORAL | 0 refills | Status: DC

## 2019-04-07 MED ORDER — INSULIN PEN NEEDLE 31G X 5 MM MISC: 1 refills | Status: AC

## 2019-04-07 MED ORDER — CYCLOBENZAPRINE HCL 10 MG PO TABS: 10.0000 mg | ORAL_TABLET | Freq: Three times a day (TID) | ORAL | 0 refills | Status: AC | PRN

## 2019-04-07 MED ORDER — CLOPIDOGREL BISULFATE 75 MG PO TABS: 75.0000 mg | ORAL_TABLET | Freq: Every day | ORAL | 1 refills | Status: DC

## 2019-04-07 NOTE — Discharge Summary (Signed)
Physician Discharge Summary  Ervine Witucki KZS:[id number] DOB: Oct 03, 1965 DOA: 03/30/2019  PCP: Garvin Fila, MD  Admit date: 03/30/2019 Discharge date: 04/07/2019  Time spent: 45 minutes  Recommendations for Outpatient Follow-up:  Patient will be discharged to home with home health physical therapy and wound care. Follow up cardiology, orthopedics, and gastroenterology. Patient will need to follow up with primary care provider within one week of discharge, repeat CBC and BMP.  Patient should continue medications as prescribed.  Patient should follow a dysphagia 2 diet.   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 Merlene Laughter, NP (ICU) Markeith Jue is a 54 year old male with a past medical history significant for CAD, hypertension, and prior CVA who presented via EMS with complaints of chest pain as a code STEMI.  On arrival patient was seen diaphoretic and with complaints of shortness of breath. Cardiology evaluated on admission with recommendations of treatment for hyperkalemia and renal failure prior to taking patient to the Cath Lab.  Complete lab work currently pending however initial  laboratory abnormalities include sodium of 118, potassium of 8, glucose greater than 700, creatinine 3.50 and WBC 28.6. EKG with tall peaked T-wave.   Nephrology consulted on admission and reccommended CRRT for acute renal failure and hyperkalemia.   PCCM consulted for admission  Hospital Course:  CAD/inferior ST elevation MI -Audiology consulted and appreciated, status post cardiac catheterization on 03/31/2019 noted to have occlusion of the PDA, other vessels were patent -Echocardiogram with an EF of 55 to 60% -Continue medical management with aspirin, Plavix, metoprolol -Continue statin  Acute renal failure on chronic kidney disease, stage III (unclear type a or B)/hyperkalemia -Creatinine 3.82 on admission -Nephrology consulted and appreciated and has since signed off -Patient was given [facility] -HD catheter was pulled on 04/02/2019 -Patient did receive IV contrast for CT of the abdomen on 04/02/2019 -Currently creatinine 1.31 -Repeat BMP  Sensation of food getting stuck in the throat/dysphagia -Speech evaluated and placed on dysphagia diet. -Barium swallow evaluation showed esophageal dysmotility -Speech recommended ENT evaluation -ENT consulted and appreciated: Dysphagia with no apparent structural normalities noted on barium swallow or fiberoptic laryngoscopy.  Patient may have some degree of laryngeal pharyngeal reflux which may be contributing some to his symptoms and might benefit from the use of omeprazole or similar antacid daily or before dinner to cover nighttime reflux.  Otherwise would recommend speech-language pathology for assistance in swallowing. -Gastroenterology did not feel there was anything from a GI standpoint to do at this time as patient does not have a stricture to dilate.  Would benefit from counseling speech pathology.  Abdominal discomfort/prandial pain/history of ulcer -Postprandial abdominal discomfort for a few week -Gastroenterology consulted and  appreciated -CT abdomen pelvis with contrast obtained however no acute finding -No EGD at this time given his recent MI -Continue PPI twice daily along with Diflucan for  thrush (last dose 04/06/2019)  Acute hypoxic respiratory failure -Secondary to overload -Oxygen weaned off -Chest x-ray shows no acute abnormalities  Diabetes mellitus, type II/HHS -HHS has resolved -Diabetes has been poorly controlled, hemoglobin A1c is 12 on 03/30/2019 -Blood sugars have been borderline controlled given that he has recently received steroids -was placed on Levemir, NovoLog premeal as well as sliding scale -Diabetes coordinator consulted -Will discharge with Levemir 20 units daily, Amaryl 4 mg in the morning, and blood glucose kit  Essential hypertension -Continue amlodipine, metoprolol  History of PE -Lower extremity Doppler negative for DVT -Patient was on heparin drip for MI however this has been discontinued -Was not on any anticoagulation prior to admission  Lower extremity wounds -Continue wound care  Right knee pain/gouty arthropathy -Patient tells me he recently saw orthopedics earlier this month and had fluid tapped off of his knee and received a steroid injection on 03/25/2019 -Is on chronic morphine at home as well as colchicine -Orthopedics consulted and appreciated, does not recommend repeat cortisone injection at this time given that patient recently had one and he is a poorly controlled diabetic.  Recommend aggressive icing to the right knee.  No signs concerning for septic arthritis at this time.  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 -Noted on CT scan, patient to follow-up with orthopedics  Pressure ulcer -Noted on heel, stage II -Wound care following -On admission  Procedures: Left IJ HD cath 03/30/2019, removed on 04/02/2019  Echocardiogram 03/31/2019  Bilateral LE Doppler 03/31/2019  Left heart catheterization 03/31/2019  Consultations: Cardiology Nephrology Gastroenterology Orthopedics ENT PCCM  Discharge Exam: Vitals:   04/07/19 0426 04/07/19 1423  BP: 102/71 130/75  Pulse: 70 83  Resp: 20   Temp: 97.9 F (36.6 C) 98 F (36.7 C)  SpO2: 95% 94%     General: Well developed, well nourished, chronically ill-appearing, NAD  HEENT: NCAT, mucous membranes moist.  Cardiovascular: S1 S2 auscultated, RRR  Respiratory: Clear to auscultation bilaterally   Abdomen: Soft, obese, nontender, nondistended, + bowel sounds  Extremities: warm dry without cyanosis clubbing or edema.  Right knee TTP, chronic appearing erythematous skin changes with hyperpigmentation on the right lower extremity more than the left.  Neuro: AAOx3, nonfocal  Psych: Appropriate mood and affect  Discharge Instructions Discharge Instructions    Ambulatory referral to Nutrition and Diabetic Education   Complete by: As directed    Discharge instructions   Complete by: As directed    Patient will be discharged to home with home health physical therapy and wound care. Follow up cardiology, orthopedics, and gastroenterology. Patient will need to follow up with primary care provider within one week of discharge, repeat CBC and BMP and discuss diabetes management.  Patient should continue medications as prescribed.  Patient should follow a dysphagia 2 diet.     Allergies as of 04/07/2019      Reactions   Other Nausea And Vomiting   Mayonnaise       Medication List    STOP taking these medications   doxazosin 2 MG tablet Commonly known as:  CARDURA   lisinopril 10 MG tablet Commonly known as: ZESTRIL  TAKE these medications   allopurinol 300 MG tablet Commonly known as: ZYLOPRIM Take 300 mg by mouth daily.   amLODipine 10 MG tablet Commonly known as: NORVASC Take 10 mg by mouth at bedtime.   aspirin EC 81 MG tablet Take 81 mg by mouth at bedtime. What changed: Another medication with the same name was removed. Continue taking this medication, and follow the directions you see here.   atorvastatin 10 MG tablet Commonly known as: LIPITOR Take 10 mg by mouth at bedtime.   benzonatate 200 MG capsule Commonly known as: TESSALON Take 1 capsule (200 mg total) by mouth 3 (three) times daily as needed for cough.   blood glucose meter kit and supplies Dispense based on patient and insurance preference. Use up to four times daily as directed. (FOR ICD-10 E10.9, E11.9).   clopidogrel 75 MG tablet Commonly known as: PLAVIX Take 1 tablet (75 mg total) by mouth daily. Start taking on: April 08, 2019   colchicine 0.6 MG tablet Take 0.6 mg by mouth at bedtime as needed (gout).   cyclobenzaprine 10 MG tablet Commonly known as: FLEXERIL Take 1 tablet (10 mg total) by mouth 3 (three) times daily as needed for muscle spasms. What changed:   when to take this  reasons to take this   furosemide 20 MG tablet Commonly known as: LASIX Take 20 mg by mouth daily as needed for fluid.   glimepiride 4 MG tablet Commonly known as: AMARYL Take 1 tablet (4 mg total) by mouth daily with breakfast. What changed:   medication strength  how much to take  when to take this   HYDROcodone-acetaminophen 5-325 MG tablet Commonly known as: NORCO/VICODIN Take 1 tablet by mouth 2 (two) times daily as needed for pain.   insulin detemir 100 UNIT/ML FlexPen Commonly known as: LEVEMIR Inject 20 Units into the skin daily.   Insulin Pen Needle 31G X 5 MM Misc Use with Levemir flexpen   metoCLOPramide 5 MG tablet Commonly known  as: REGLAN Take 1 tablet (5 mg total) by mouth 3 (three) times daily before meals.   metoprolol succinate 50 MG 24 hr tablet Commonly known as: TOPROL-XL Take 3 tablets (150 mg total) by mouth daily. Start taking on: April 08, 2019 What changed:   medication strength  how much to take  additional instructions   morphine 15 MG 12 hr tablet Commonly known as: MS CONTIN Take 15 mg by mouth 3 (three) times daily.   naloxone 4 MG/0.1ML Liqd nasal spray kit Commonly known as: NARCAN Place 1 spray into the nose once.   nitroGLYCERIN 0.4 MG SL tablet Commonly known as: NITROSTAT Place 0.4 mg under the tongue every 5 (five) minutes as needed for chest pain.   pantoprazole 40 MG tablet Commonly known as: Protonix Take 1 tablet (40 mg total) by mouth 2 (two) times daily.   predniSONE 10 MG tablet Commonly known as: DELTASONE Take 10 mg by mouth daily as needed.   pregabalin 100 MG capsule Commonly known as: LYRICA Take 100 mg by mouth 3 (three) times daily.   triamcinolone cream 0.5 % Commonly known as: KENALOG Apply 1 application topically 3 (three) times daily. APPLY FOR 4 WEEKS STOP FOR 1 WEEK & REPEAT AS NEEDED      Allergies  Allergen Reactions  . Other Nausea And Vomiting    Mayonnaise     Follow-up Information    [facility]-Home Follow up.   Specialty: Home Health Services Why: Registered  Nurse-for wound care and Physical Therapy-office to call with visit times. If you have questions please call [phone]       Garvin Fila, MD. Schedule an appointment as soon as possible for a visit in 1 week(s).   Specialties: Neurology, Radiology Why: Hospital follow up Contact information: [address] [place] [address] [phone]        Anson Fret F, MD. Schedule an appointment as soon as possible for a visit in 2 week(s).   Specialty: Gastroenterology Why: Hospital follow up Contact information: [address] [place] [address] [phone]        Burnell Blanks, MD. Schedule an appointment as soon as possible for a visit in 2 week(s).   Specialty: Cardiology Why: Hospital follow up Contact information: [place]. [address] [place] [address] [phone]            The results of significant diagnostics from this hospitalization (including imaging, microbiology, ancillary and laboratory) are listed below for reference.    Significant Diagnostic Studies: DG Knee 1-2 Views Right  Result Date: 04/03/2019 CLINICAL DATA:  No known injury EXAM: RIGHT KNEE - 1-2 VIEW COMPARISON:  None. FINDINGS: Generalized osteopenia. No fracture or dislocation. No aggressive osseous lesion. Mild patellofemoral compartment and medial femorotibial compartment joint space narrowing with marginal osteophytes. Chondrocalcinosis of the medial and lateral femorotibial compartments as can be seen with CPPD. Peripheral vascular atherosclerotic disease. Soft tissue dystrophic calcifications along the anterior aspect of the knee. IMPRESSION: No acute osseous injury of the right knee. Mild osteoarthritis of the patellofemoral compartment and medial femorotibial compartment. Electronically Signed   By: Kathreen Devoid   On: 04/03/2019 11:12   DG Abd 1 View  Result Date: 04/02/2019 CLINICAL DATA:  Nausea and vomiting EXAM: ABDOMEN - 1 VIEW COMPARISON:  07/23/2016 FINDINGS: Scattered large and small bowel gas is noted. No obstructive changes are seen. Postsurgical changes in the lumbar spine are noted as well as a thoracic spinal stimulator. No other focal abnormality is seen. IMPRESSION: No acute abnormality in the abdomen. Electronically Signed   By: Inez Catalina M.D.   On: 04/02/2019 09:41   CT ABDOMEN PELVIS W CONTRAST  Result Date: 04/02/2019 CLINICAL DATA:  Right upper quadrant abdominal pain. Renal failure. Shortness of breath with difficulty swallowing. EXAM: CT ABDOMEN AND PELVIS  WITH CONTRAST TECHNIQUE: Multidetector CT imaging of the abdomen and pelvis was performed using the standard protocol following bolus administration of intravenous contrast. CONTRAST:  137m OMNIPAQUE IOHEXOL 300 MG/ML  SOLN COMPARISON:  Renal ultrasound 03/30/2019 and CT abdomen from 01/27/2017 FINDINGS: Lower chest: There is atelectasis in the left lower lobe along with subsegmental atelectasis in the lingula. Left anterior descending, circumflex, and right coronary artery atherosclerosis. Borderline elevated left hemidiaphragm. Hepatobiliary: Unremarkable Pancreas: Unremarkable Spleen: Unremarkable Adrenals/Urinary Tract: Both adrenal glands appear normal. Scarring in the right kidney leading to relative right renal atrophy. The adrenal glands appear unremarkable. There about 7 tiny punctate nonobstructive right renal calculi measuring up to 2 mm in diameter. They were 5 nonobstructive punctate left renal calculi likewise measuring up to 2 mm in diameter. No hydronephrosis, hydroureter, or appreciable ureteral calculus. Foley catheter is present in the urinary bladder. Stomach/Bowel: Periampullary duodenal diverticulum. Moderately redundant sigmoid colon. Normal appendix. Vascular/Lymphatic: Aortoiliac atherosclerotic vascular disease. Right external iliac node 1.2 cm in short axis on image 75/3, previously the same on 10/04/2013. Right pelvic sidewall lymph node 0.9 cm in short axis on  image 83/3, previously the same on 10/04/2013. Reproductive: Uterus absent. Adnexa unremarkable. Other: No supplemental non-categorized findings. Musculoskeletal: Bilateral elastofibroma dorsi. Dorsal column stimulator noted. Severe asymmetric degenerative arthropathy left hip. Posterolateral rod and pedicle screw fixation at L2-L3-L4-L5-S1, with the left pedicle screw at L2 capturing the superior endplate of L2, and with solid interbody bony fusions at L2-3 and L4-5. There is multilevel degenerative arthropathy and the right L5  pedicle screw may capture the inferior margin of the pedicle. There is also some mild lucency around the tip of the right S1 pedicle screw. IMPRESSION: 1. A specific cause for the patient's right upper quadrant abdominal pain is not identified. 2. Bilateral nonobstructive nephrolithiasis. 3. Scarring in the right kidney leading to relative right renal atrophy. 4. Coronary atherosclerosis. 5. Borderline elevated left hemidiaphragm with adjacent left lower lobe atelectasis. 6. Borderline enlarged right external iliac lymph node, stable from 10/04/2013. 7. Severe asymmetric degenerative arthropathy left hip. 8. Postoperative findings in the lumbar spine. The right L5 pedicle screw may capture the inferior margin of the pedicle. There is also some mild lucency around the tip of the right S1 pedicle screw. 9. Bilateral elastofibroma dorsi. Aortic Atherosclerosis (ICD10-I70.0). Electronically Signed   By: Van Clines M.D.   On: 04/02/2019 15:23   CARDIAC CATHETERIZATION  Result Date: 03/31/2019  LIKELY CULPRIT LESION: Mid PDA (small caliber) 100% -> not good PCI target.  Prox RCA lesion is 30% stenosed.  Previously placed Prox RCA to Mid RCA stent (unknown type) is widely patent.  Mid RCA lesion is 60% stenosed.  1st Diag/Ramus lesion is 40% stenosed.  Ramus/OM lesion is 50% stenosed.  1st Mrg/Distal LCx lesion is 60% stenosed.  LV end diastolic pressure is normal.  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  Does not appear to be true shock (likely related to sedation)  Hyperdynamic cardiac motion (very difficult to engage coronary arteries)  Large patulous left main with 4 major branches PLAN OF  CARE:  Transfer back to MICU (2 M) -> we will restart IV heparin 8 hours after sheath removal.  Would run IV heparin for total 72 hours including last night.  As needed pain meds  We will defer to consulting team, but could consider clopidogrel (we will load today)  We will defer management of renal failure to primary team/nephrology. Glenetta Hew, M.D., M.S. Interventional Cardiologist [place] [place], [place] [address]  US RENAL  Result Date: 03/30/2019 CLINICAL DATA:  Acute kidney injury EXAM: RENAL / URINARY TRACT ULTRASOUND COMPLETE COMPARISON:  CT abdomen 01/27/2017 FINDINGS: Right Kidney: Renal measurements: 8.5 by 4.8 by 4.2 cm = volume: 90 mL . Echogenicity within normal limits. No mass or hydronephrosis visualized. Scarring in the right kidney most strikingly in the upper pole. Left Kidney: The left kidney cannot be visualized sonographically. Bladder: Not visualized sonographically. Other: Limiting factors included patient's sedation and patient body habitus. IMPRESSION: 1. Considerably atrophic right kidney. 2. Nonvisualization of the left kidney and urinary bladder. Electronically Signed   By: Van Clines M.D.   On: 03/30/2019 21:21   DG CHEST PORT 1 VIEW  Result Date: 04/02/2019 CLINICAL DATA:  Shortness of breath EXAM: PORTABLE CHEST 1 VIEW COMPARISON:  03/30/2019 FINDINGS:  Cardiac shadow is stable. Previously seen vascular congestion has resolved. Left jugular central line is been removed in the interval. Mild left basilar atelectasis is seen. IMPRESSION: Improved vascular congestion. Persistent left basilar atelectasis is noted. Electronically Signed   By: Inez Catalina M.D.   On: 04/02/2019 09:40   DG CHEST PORT 1 VIEW  Result Date: 03/30/2019 CLINICAL DATA:  Central line placement. Acute kidney injury. Hyperkalemia. Pulmonary edema. EXAM: PORTABLE CHEST 1 VIEW COMPARISON:  Chest x-rays dated 03/30/2019 and 03/23/2018 FINDINGS: Double lumen central venous  catheter has been inserted. The tip is overlying the superior vena cava just below the carina in good position. Persistent pulmonary vascular congestion and mild perihilar haziness consistent with mild edema. Mild bibasilar atelectasis. No pneumothorax. No acute bone abnormality. Thoracic spinal cord stimulator in place. IMPRESSION: Central line in good position. No pneumothorax. Persistent pulmonary vascular congestion and mild perihilar edema. Electronically Signed   By: Lorriane Shire M.D.   On: 03/30/2019 18:38   DG Chest Portable 1 View  Result Date: 03/30/2019 CLINICAL DATA:  Chest pain and shortness of breath EXAM: PORTABLE CHEST 1 VIEW COMPARISON:  March 23, 2018 FINDINGS: Heart is enlarged with pulmonary venous hypertension. There is interstitial edema. No appreciable consolidation. No pleural effusions evident. No adenopathy. Stimulator leads are in the lower thoracic region, stable. No pneumothorax. No bone lesions. IMPRESSION: Cardiomegaly with pulmonary vascular congestion. Interstitial edema. Appearance indicative of congestive heart failure. No consolidation. Electronically Signed   By: Lowella Grip III M.D.   On: 03/30/2019 15:08   DG Swallowing Func-Speech Pathology  Result Date: 04/05/2019 Objective Swallowing Evaluation: Type of Study: MBS-Modified Barium Swallow Study  Patient Details Name: Jesus Cruz MRN: [id number] Date of Birth: 05/19/1965 Today's Date: 04/05/2019 Time: SLP Start Time (ACUTE ONLY): 0945 -SLP Stop Time (ACUTE ONLY): 1000 SLP Time Calculation (min) (ACUTE ONLY): 15 min Past Medical History: Past Medical History: Diagnosis Date . CVA (cerebral vascular accident) ([place])  . Gout  . Hypertension  . MGUS (monoclonal gammopathy of unknown significance)  . Obesity  . OSA on CPAP  . Pulmonary embolus ([place]) 05/2012  During recovery from MI, completed Coumadin . STEMI (ST elevation myocardial infarction) ([place]) 04/2012  Integrity BMS RCA with minor left system disease Past  Surgical History: Past Surgical History: Procedure Laterality Date . CORONARY ANGIOPLASTY WITH STENT PLACEMENT  04/2012  Integrity BMS RCA, minor L system dz . LEFT HEART CATH AND CORONARY ANGIOGRAPHY N/A 03/31/2019  Procedure: LEFT HEART CATH AND CORONARY ANGIOGRAPHY;  Surgeon: Leonie Man, MD;  Location: [facility];  Service: Cardiovascular;  Laterality: N/A; . RADIOLOGY WITH ANESTHESIA N/A 09/16/2013  Procedure: RADIOLOGY WITH ANESTHESIA;  Surgeon: Rob Hickman, MD;  Location: [place] [facility];  Service: Radiology;  Laterality: N/A; HPI: 54 year old male with past medical history significant for CAD, hypertension, and prior CVA, admitted with complaints of chest pain as a code STEMI found to have hyperkalemia, acute renal failure, and DKA. Pt uinderwent LHC on 3/24 with recommendation for medical management. Pt has had c/o liquids > solids "pooling" in his throat, therefore SLP swallow evaluation was ordered.  Subjective: alert, pleasant, feels like he is having the most trouble with thin liquids Assessment / Plan / Recommendation CHL IP CLINICAL IMPRESSIONS 04/05/2019 Clinical Impression Pt demonstrates a moderate pharyngeal dysphagia with an anatomical variant that may be impacting epiglottic deflection and laryngeal closure. Pt is noted to have bilateral projections between the epiglottis and arytenoids, suspected to be protrusions of the superior horn  of the thyroid cartilage. Barium travels around these structures, however, pt has no deflection of the epiglottis despite othewise adequate movement of hyoid excursion and laryngeal elevation. Base of tongue retarction could be better, but is not the primary deficit. Lack of epiglottic deflection causes consistent high penetration of liquids and impacts clearance of solids with mild to moderate pharyngeal residue. Pt is able to protect his airway with strong vestibular closure, but occasionally needs a cough or throat clear to fully eject penetrate post  swallow. Pt needed cueing to learn a throat clear, which can be more efficient in clearing the vestibule than a cough.  A chin tuck is beneficial to decrease penetration. Thickened liquids were also penetrated so there is minimal benefit in thickening unless pt feels increased comfort. Will f/u with direct imaging of the larynx via FEES to further visualize pts anatomy and impact on swallow function.  SLP Visit Diagnosis Dysphagia, unspecified (R13.10) Attention and concentration deficit following -- Frontal lobe and executive function deficit following -- Impact on safety and function Mild aspiration risk;Moderate aspiration risk   CHL IP TREATMENT RECOMMENDATION 04/05/2019 Treatment Recommendations F/U FEES in --- days (Comment)   Prognosis 04/05/2019 Prognosis for Safe Diet Advancement Good Barriers to Reach Goals -- Barriers/Prognosis Comment -- CHL IP DIET RECOMMENDATION 04/05/2019 SLP Diet Recommendations Dysphagia 3 (Mech soft) solids;Thin liquid Liquid Administration via Straw Medication Administration Whole meds with puree Compensations Clear throat after each swallow;Follow solids with liquid;Chin tuck;Use straw to facilitate chin tuck Postural Changes Seated upright at 90 degrees   CHL IP OTHER RECOMMENDATIONS 04/05/2019 Recommended Consults Consider ENT evaluation Oral Care Recommendations Oral care BID Other Recommendations --   CHL IP FOLLOW UP RECOMMENDATIONS 04/05/2019 Follow up Recommendations Other (comment)   CHL IP FREQUENCY AND DURATION 04/05/2019 Speech Therapy Frequency (ACUTE ONLY) min 2x/week Treatment Duration 2 weeks      CHL IP ORAL PHASE 04/05/2019 Oral Phase WFL Oral - Pudding Teaspoon -- Oral - Pudding Cup -- Oral - Honey Teaspoon -- Oral - Honey Cup -- Oral - Nectar Teaspoon -- Oral - Nectar Cup WFL;Lingual/palatal residue Oral - Nectar Straw WFL;Lingual/palatal residue Oral - Thin Teaspoon -- Oral - Thin Cup [facility];Lingual/palatal residue Oral - Thin Straw WFL;Lingual/palatal residue Oral -  Puree WFL;Lingual/palatal residue Oral - Mech Soft -- Oral - Regular WFL;Lingual/palatal residue Oral - Multi-Consistency -- Oral - Pill -- Oral Phase - Comment --  CHL IP PHARYNGEAL PHASE 04/05/2019 Pharyngeal Phase Impaired Pharyngeal- Pudding Teaspoon -- Pharyngeal -- Pharyngeal- Pudding Cup -- Pharyngeal -- Pharyngeal- Honey Teaspoon -- Pharyngeal -- Pharyngeal- Honey Cup -- Pharyngeal -- Pharyngeal- Nectar Teaspoon -- Pharyngeal -- Pharyngeal- Nectar Cup Reduced epiglottic inversion;Reduced airway/laryngeal closure;Reduced tongue base retraction;Penetration/Aspiration during swallow;Penetration/Apiration after swallow;Trace aspiration;Pharyngeal residue - pyriform;Delayed swallow initiation-pyriform sinuses Pharyngeal Material enters airway, remains ABOVE vocal cords then ejected out;Material enters airway, CONTACTS cords and not ejected out;Material enters airway, CONTACTS cords and then ejected out Pharyngeal- Nectar Straw Reduced epiglottic inversion;Reduced airway/laryngeal closure;Reduced tongue base retraction;Pharyngeal residue - pyriform;Delayed swallow initiation-pyriform sinuses;Compensatory strategies attempted (with notebox);Penetration/Aspiration during swallow Pharyngeal Material enters airway, remains ABOVE vocal cords then ejected out Pharyngeal- Thin Teaspoon -- Pharyngeal -- Pharyngeal- Thin Cup Reduced epiglottic inversion;Reduced airway/laryngeal closure;Reduced tongue base retraction;Penetration/Aspiration during swallow;Penetration/Apiration after swallow;Trace aspiration;Pharyngeal residue - pyriform;Delayed swallow initiation-pyriform sinuses Pharyngeal Material enters airway, passes BELOW cords then ejected out;Material enters airway, remains ABOVE vocal cords then ejected out;Material does not enter airway Pharyngeal- Thin Straw Reduced epiglottic inversion;Reduced airway/laryngeal closure;Reduced tongue base retraction;Pharyngeal residue - pyriform;Delayed swallow  initiation-pyriform  sinuses;Compensatory strategies attempted (with notebox);Penetration/Aspiration during swallow Pharyngeal -- Pharyngeal- Puree Reduced epiglottic inversion;Delayed swallow initiation-vallecula;Pharyngeal residue - pyriform Pharyngeal -- Pharyngeal- Mechanical Soft -- Pharyngeal -- Pharyngeal- Regular Delayed swallow initiation-vallecula;Reduced epiglottic inversion;Compensatory strategies attempted (with notebox) Pharyngeal -- Pharyngeal- Multi-consistency -- Pharyngeal -- Pharyngeal- Pill -- Pharyngeal -- Pharyngeal Comment --  No flowsheet data found. Herbie Baltimore, MA CCC-SLP Acute Rehabilitation Services Pager [phone] Office [phone] Lynann Beaver 04/05/2019, 11:34 AM              ECHOCARDIOGRAM COMPLETE  Result Date: 03/31/2019    ECHOCARDIOGRAM REPORT   Patient Name:   Jesus Cruz Date of Exam: 03/31/2019 Medical Rec #:  [id number]    Height:       67.0 in Accession #:    [id number]   Weight:       314.4 lb Date of Birth:  [birth year]     BSA:          2.451 m Patient Age:    16 years     BP:           101/74 mmHg Patient Gender: M            HR:           101 bpm. Exam Location:  Inpatient Procedure: 2D Echo, Cardiac Doppler and Color Doppler Indications:    Chest pain 786.50/R07.9  History:        Patient has prior history of Echocardiogram examinations, most                 recent 09/17/2013. CAD; Risk Factors:Hypertension. Acute MI.                 Acute renal failure. H/o CVA.  Sonographer:    Clayton Lefort RDCS (AE) Referring Phys: 1993 [facility] G BARRETT  Sonographer Comments: Suboptimal parasternal window, suboptimal apical window, suboptimal subcostal window and patient is morbidly obese. Image acquisition challenging due to patient body habitus. IMPRESSIONS  1. Small area of inferior basal hypokinesis. Left ventricular ejection fraction, by estimation, is 55 to 60%. The left ventricle has normal function. Left ventricular endocardial border not optimally defined to evaluate regional  wall motion. There is mild left ventricular hypertrophy. Left ventricular diastolic parameters were normal.  2. Right ventricular systolic function is normal. The right ventricular size is normal.  3. The mitral valve was not well visualized. No evidence of mitral valve regurgitation. No evidence of mitral stenosis.  4. The aortic valve was not well visualized. Aortic valve regurgitation is not visualized. No aortic stenosis is present.  5. The inferior vena cava is normal in size with greater than 50% respiratory variability, suggesting right atrial pressure of 3 mmHg. FINDINGS  Left Ventricle: Small area of inferior basal hypokinesis. Left ventricular ejection fraction, by estimation, is 55 to 60%. The left ventricle has normal function. Left ventricular endocardial border not optimally defined to evaluate regional wall motion. The left ventricular internal cavity size was normal in size. There is mild left ventricular hypertrophy. Left ventricular diastolic parameters were normal. Right Ventricle: The right ventricular size is normal. No increase in right ventricular wall thickness. Right ventricular systolic function is normal. Left Atrium: Left atrial size was normal in size. Right Atrium: Right atrial size was normal in size. Pericardium: There is no evidence of pericardial effusion. Mitral Valve: The mitral valve was not well visualized. There is mild thickening of the mitral valve leaflet(s). There is mild calcification of the mitral valve leaflet(s).  Normal mobility of the mitral valve leaflets. No evidence of mitral valve regurgitation. No evidence of mitral valve stenosis. Tricuspid Valve: The tricuspid valve is normal in structure. Tricuspid valve regurgitation is not demonstrated. No evidence of tricuspid stenosis. Aortic Valve: The aortic valve was not well visualized. Aortic valve regurgitation is not visualized. No aortic stenosis is present. Aortic valve mean gradient measures 3.0 mmHg. Aortic valve  peak gradient measures 5.3 mmHg. Aortic valve area, by VTI measures 4.55 cm. Pulmonic Valve: The pulmonic valve was normal in structure. Pulmonic valve regurgitation is not visualized. No evidence of pulmonic stenosis. Aorta: The aortic root is normal in size and structure. Venous: The inferior vena cava is normal in size with greater than 50% respiratory variability, suggesting right atrial pressure of 3 mmHg. IAS/Shunts: No atrial level shunt detected by color flow Doppler.  LEFT VENTRICLE PLAX 2D LVIDd:         3.70 cm  Diastology LVIDs:         2.70 cm  LV e' lateral:   4.57 cm/s LV PW:         1.50 cm  LV E/e' lateral: 13.9 LV IVS:        1.20 cm  LV e' medial:    5.22 cm/s LVOT diam:     2.30 cm  LV E/e' medial:  12.2 LV SV:         72 LV SV Index:   29 LVOT Area:     4.15 cm  RIGHT VENTRICLE             IVC RV Basal diam:  2.60 cm     IVC diam: 1.90 cm RV S prime:     17.60 cm/s TAPSE (M-mode): 1.9 cm LEFT ATRIUM             Index       RIGHT ATRIUM          Index LA diam:        1.60 cm 0.65 cm/m  RA Area:     9.70 cm LA Vol (A2C):   41.2 ml 16.81 ml/m RA Volume:   18.10 ml 7.39 ml/m LA Vol (A4C):   49.8 ml 20.32 ml/m LA Biplane Vol: 48.3 ml 19.71 ml/m  AORTIC VALVE AV Area (Vmax):    3.76 cm AV Area (Vmean):   3.68 cm AV Area (VTI):     4.55 cm AV Vmax:           115.00 cm/s AV Vmean:          88.900 cm/s AV VTI:            0.159 m AV Peak Grad:      5.3 mmHg AV Mean Grad:      3.0 mmHg LVOT Vmax:         104.00 cm/s LVOT Vmean:        78.700 cm/s LVOT VTI:          0.174 m LVOT/AV VTI ratio: 1.09  AORTA Ao Root diam: 3.20 cm MITRAL VALVE MV Area (PHT): 2.26 cm    SHUNTS MV Decel Time: 335 msec    Systemic VTI:  0.17 m MV E velocity: 63.50 cm/s  Systemic Diam: 2.30 cm MV A velocity: 79.50 cm/s MV E/A ratio:  0.80 Jenkins Rouge MD Electronically signed by Jenkins Rouge MD Signature Date/Time: 03/31/2019/2:11:49 PM    Final    VAS [place] LOWER EXTREMITY VENOUS (DVT)  Result Date: 03/31/2019  Lower  Venous DVTStudy Indications: Edema, and SOB.  Limitations: Body habitus and poor ultrasound/tissue interface. Comparison Study: No prior exam. Performing Technologist: Baldwin Crown ARDMS, RVT  Examination Guidelines: A complete evaluation includes B-mode imaging, spectral Doppler, color Doppler, and power Doppler as needed of all accessible portions of each vessel. Bilateral testing is considered an integral part of a complete examination. Limited examinations for reoccurring indications may be performed as noted. The reflux portion of the exam is performed with the patient in reverse Trendelenburg.  +---------+---------------+---------+-----------+----------+------------------+ RIGHT    CompressibilityPhasicitySpontaneityPropertiesThrombus Aging     +---------+---------------+---------+-----------+----------+------------------+ CFV      Full           Yes      Yes                                     +---------+---------------+---------+-----------+----------+------------------+ SFJ      Full                                                            +---------+---------------+---------+-----------+----------+------------------+ FV Prox  Full                                                            +---------+---------------+---------+-----------+----------+------------------+ FV Mid   Full                                                            +---------+---------------+---------+-----------+----------+------------------+ FV DistalFull                                                            +---------+---------------+---------+-----------+----------+------------------+ PFV      Full                                                            +---------+---------------+---------+-----------+----------+------------------+ POP      Full           Yes      Yes                                      +---------+---------------+---------+-----------+----------+------------------+ PTV      Full                                                            +---------+---------------+---------+-----------+----------+------------------+  PERO                                                  visualized with                                                          color              +---------+---------------+---------+-----------+----------+------------------+   +---------+---------------+---------+-----------+----------+------------------+ LEFT     CompressibilityPhasicitySpontaneityPropertiesThrombus Aging     +---------+---------------+---------+-----------+----------+------------------+ CFV      Full           Yes      Yes                                     +---------+---------------+---------+-----------+----------+------------------+ SFJ      Full                                                            +---------+---------------+---------+-----------+----------+------------------+ FV Prox  Full                                                            +---------+---------------+---------+-----------+----------+------------------+ FV Mid   Full                                                            +---------+---------------+---------+-----------+----------+------------------+ FV DistalFull                                                            +---------+---------------+---------+-----------+----------+------------------+ PFV      Full                                                            +---------+---------------+---------+-----------+----------+------------------+ POP      Full           Yes      Yes                                     +---------+---------------+---------+-----------+----------+------------------+ PTV      Full                                                             +---------+---------------+---------+-----------+----------+------------------+  PERO                                                  visualized with                                                          color              +---------+---------------+---------+-----------+----------+------------------+     Summary: RIGHT: - There is no evidence of deep vein thrombosis in the lower extremity. However, portions of this examination were limited- see technologist comments above.  - No cystic structure found in the popliteal fossa. - Poorly visualized calf veins.  LEFT: - There is no evidence of deep vein thrombosis in the lower extremity. However, portions of this examination were limited- see technologist comments above.  - No cystic structure found in the popliteal fossa. - Poorly visualized calf veins.  *See table(s) above for measurements and observations. Electronically signed by Ruta Hinds MD on 03/31/2019 at 4:01:49 PM.    Final    DG ESOPHAGUS W SINGLE CM (SOL OR THIN BA)  Result Date: 04/05/2019 CLINICAL DATA:  Food getting stuck.  Dysphagia. EXAM: ESOPHOGRAM/BARIUM SWALLOW TECHNIQUE: Single contrast examination was performed using  thin barium. FLUOROSCOPY TIME:  Fluoroscopy Time:  1 minutes 24 seconds Radiation Exposure Index (if provided by the fluoroscopic device): Number of Acquired Spot Images: 0 COMPARISON:  None. FINDINGS: No aspiration identified. Moderate esophageal dysmotility. Barium passes readily into the stomach without delay. No esophageal dilatation. No stricture or mass. No hiatal hernia identified. IMPRESSION: Esophageal dysmotility.  No stricture or mass identified. Electronically Signed   By: Franchot Gallo M.D.   On: 04/05/2019 11:10    Microbiology: Recent Results (from the past 240 hour(s))  Respiratory Panel by RT PCR (Flu A&B, Covid) - Nasopharyngeal Swab     Status: None   Collection Time: 03/30/19  2:39 PM   Specimen: Nasopharyngeal Swab  Result Value  Ref Range Status   SARS Coronavirus 2 by RT PCR NEGATIVE NEGATIVE Final    Comment: (NOTE) SARS-CoV-2 target nucleic acids are NOT DETECTED. The SARS-CoV-2 RNA is generally detectable in upper respiratoy specimens during the acute phase of infection. The lowest concentration of SARS-CoV-2 viral copies this assay can detect is 131 copies/mL. A negative result does not preclude SARS-Cov-2 infection and should not be used as the sole basis for treatment or other patient management decisions. A negative result may occur with  improper specimen collection/handling, submission of specimen other than nasopharyngeal swab, presence of viral mutation(s) within the areas targeted by this assay, and inadequate number of viral copies (<131 copies/mL). A negative result must be combined with clinical observations, patient history, and epidemiological information. The expected result is Negative. Fact Sheet for Patients:  PinkCheek.be Fact Sheet for Healthcare Providers:  GravelBags.it This test is not yet ap proved or cleared by the [place] FDA and  has been authorized for detection and/or diagnosis of SARS-CoV-2 by FDA under an Emergency Use Authorization (EUA). This EUA will remain  in effect (meaning this test can be used) for the duration of the COVID-19 declaration under Section 564(b)(1) of the Act,  21 U.S.C. section 360bbb-3(b)(1), unless the authorization is terminated or revoked sooner.    Influenza A by PCR NEGATIVE NEGATIVE Final   Influenza B by PCR NEGATIVE NEGATIVE Final    Comment: (NOTE) The Xpert Xpress SARS-CoV-2/FLU/RSV assay is intended as an aid in  the diagnosis of influenza from Nasopharyngeal swab specimens and  should not be used as a sole basis for treatment. Nasal washings and  aspirates are unacceptable for Xpert Xpress SARS-CoV-2/FLU/RSV  testing. Fact Sheet for  Patients: PinkCheek.be Fact Sheet for Healthcare Providers: GravelBags.it This test is not yet approved or cleared by the [place] FDA and  has been authorized for detection and/or diagnosis of SARS-CoV-2 by  FDA under an Emergency Use Authorization (EUA). This EUA will remain  in effect (meaning this test can be used) for the duration of the  Covid-19 declaration under Section 564(b)(1) of the Act, 21  U.S.C. section 360bbb-3(b)(1), unless the authorization is  terminated or revoked. Performed at [facility], [place] [address], [place] [employer], [place] [address]   Culture, blood (routine x 2)     Status: None   Collection Time: 03/30/19  2:50 PM   Specimen: BLOOD  Result Value Ref Range Status   Specimen Description BLOOD RIGHT ANTECUBITAL  Final   Special Requests   Final    BOTTLES DRAWN AEROBIC AND ANAEROBIC Blood Culture adequate volume   Culture   Final    NO GROWTH 5 DAYS Performed at [facility], [place] [address], [place], [place] [address]    Report Status 04/04/2019 FINAL  Final  Culture, blood (routine x 2)     Status: None   Collection Time: 03/30/19  4:47 PM   Specimen: BLOOD LEFT FOREARM  Result Value Ref Range Status   Specimen Description BLOOD LEFT FOREARM  Final   Special Requests   Final    BOTTLES DRAWN AEROBIC AND ANAEROBIC Blood Culture adequate volume   Culture   Final    NO GROWTH 5 DAYS Performed at [facility], [place] [address] [place], [place] [address]    Report Status 04/04/2019 FINAL  Final  MRSA PCR Screening     Status: Abnormal   Collection Time: 03/30/19  4:54 PM   Specimen: Nasopharyngeal  Result Value Ref Range Status   MRSA by PCR POSITIVE (A) NEGATIVE Final    Comment:        The GeneXpert MRSA Assay (FDA approved for NASAL specimens only), is one component of a comprehensive MRSA colonization surveillance program. It is not intended to diagnose MRSA infection  nor to guide or monitor treatment for MRSA infections. RESULT CALLED TO, READ BACK BY AND VERIFIED WITH: R ROBERTS RN 03/30/19 1853 JDW Performed at [facility], [place] [address], [place],  [address]      Labs: Basic Metabolic Panel: Recent Labs  Lab 04/03/19 [phone] 04/03/19 0358 04/04/19 0542 04/05/19 0351 04/06/19 0654 04/07/19 0306 04/07/19 1239  NA 136  --  134* 134* 135 132*  --   K 5.1   < > 4.9 4.7 4.8 5.5* 4.5  CL 107  --  108 107 108 107  --   CO2 19*  --  17* 16* 17* 20*  --   GLUCOSE 92  --  136* 259* 144* 156*  --   BUN 30*  --  29* 32* 36* 33*  --   CREATININE 1.22  --  1.28* 1.37* 1.41* 1.31*  --  CALCIUM 8.9  --  8.6* 8.5* 9.0 8.4*  --   MG 2.0  --  1.8 1.8 1.9 1.9  --   PHOS 3.4  --  4.2 3.9 4.0 4.3  --    < > = values in this interval not displayed.   Liver Function Tests: Recent Labs  Lab 04/02/19 0355 04/02/19 0355 04/03/19 0358 04/04/19 0542 04/05/19 0351 04/06/19 0654 04/07/19 0306  AST 33  --   --   --   --   --   --   ALT 32  --   --   --   --   --   --   ALKPHOS 105  --   --   --   --   --   --   BILITOT 1.0  --   --   --   --   --   --   PROT 7.0  --   --   --   --   --   --   ALBUMIN 2.6*  2.5*   < > 2.6* 2.4* 2.5* 2.6* 2.6*   < > = values in this interval not displayed.   Recent Labs  Lab 04/02/19 0355  LIPASE 55*   No results for input(s): AMMONIA in the last 168 hours. CBC: Recent Labs  Lab 04/02/19 0355 04/03/19 0358 04/05/19 0855 04/06/19 0654 04/07/19 0306  WBC 17.6* 18.2* 19.7* 23.6* 20.2*  HGB 14.2 13.8 12.6* 12.7* 11.9*  HCT 44.2 42.4 38.7* 39.8 37.3*  MCV 94.6 94.0 94.4 94.3 95.4  PLT 265 289 316 334 299   Cardiac Enzymes: No results for input(s): CKTOTAL, CKMB, CKMBINDEX, TROPONINI in the last 168 hours. BNP: BNP (last 3 results) Recent Labs    03/30/19 1451 03/30/19 1813  BNP 88.7 549.1*    ProBNP (last 3 results) No results for input(s): PROBNP in the last 8760 hours.  CBG: Recent  Labs  Lab 04/06/19 1609 04/06/19 2046 04/07/19 0604 04/07/19 0757 04/07/19 1154  GLUCAP 248* 273* 113* 95 217*       Signed:  Alexxus Sobh  [facility] 04/07/2019, 3:36 PM

## 2019-04-07 NOTE — Procedures (Addendum)
Objective Swallowing Evaluation: Type of Study: FEES-Fiberoptic Endoscopic Evaluation of Swallow   Patient Details  Name: Jesus Cruz MRN: [id number] Date of Birth: 02-11-1965  Today's Date: 04/07/2019 Time: SLP Start Time (ACUTE ONLY): 1430 -SLP Stop Time (ACUTE ONLY): 1515  SLP Time Calculation (min) (ACUTE ONLY): 45 min   Past Medical History:  Past Medical History:  Diagnosis Date  . CVA (cerebral vascular accident) ([place])   . Gout   . Hypertension   . MGUS (monoclonal gammopathy of unknown significance)   . Obesity   . OSA on CPAP   . Pulmonary embolus ([place]) 05/2012   During recovery from MI, completed Coumadin  . Stage 3 chronic kidney disease   . STEMI (ST elevation myocardial infarction) ([place]) 04/2012   Integrity BMS RCA with minor left system disease   Past Surgical History:  Past Surgical History:  Procedure Laterality Date  . CORONARY ANGIOPLASTY WITH STENT PLACEMENT  04/2012   Integrity BMS RCA, minor L system dz  . LEFT HEART CATH AND CORONARY ANGIOGRAPHY N/A 03/31/2019   Procedure: LEFT HEART CATH AND CORONARY ANGIOGRAPHY;  Surgeon: Leonie Man, MD;  Location: [facility];  Service: Cardiovascular;  Laterality: N/A;  . RADIOLOGY WITH ANESTHESIA N/A 09/16/2013   Procedure: RADIOLOGY WITH ANESTHESIA;  Surgeon: Rob Hickman, MD;  Location: [facility];  Service: Radiology;  Laterality: N/A;   HPI: 54 year old male with past medical history significant for CAD, hypertension, and prior CVA, admitted with complaints of chest pain as a code STEMI found to have hyperkalemia, acute renal failure, and DKA. Pt uinderwent LHC on 3/24 with recommendation for medical management. Pt has had c/o liquids > solids "pooling" in his throat, therefore SLP swallow evaluation was ordered.   Subjective: pt alert, pleasant    Assessment / Plan / Recommendation  CHL IP CLINICAL IMPRESSIONS 04/07/2019  Clinical Impression Pt's swallow function appears to be consistent overall  with results of MBS, with insufficient epiglottic inflection that leads to aspiration of thin liquids unless utilizing compensatory strategies (chin tuck, throat clear). Anatomy is more clearly visualized during FEES, and I can see that pt adducts his false vocal folds before the swallow, which helps to facilitate airway protection in light of his otherwise open laryngeal vestibule. I do not see any specific protrusions that were visualized on MBS that might impede downward epiglottic movement, although there are two ridges that seem to extend bilaterally from around the epiglottis. Question if this could be impacting epiglottic movement, with reduced movement noted overall as well as asymmetric movement during swallowing. He appears to be more symmetric at rest, but when he swallows his epiglottis begins to move laterally toward the pt's left. His arytenoids also appear to be edematous, and thin liquids will pool in his pyriform sinuses before the swallow, spilling over the arytenoids and into the laryngeal vestibule before the swallow is initiated. Recommend continuing current diet (Dys 3 solids, thin liquids with chin tuck/throat clear). Could consider a trial of [facility] SLP post-discharge for dysphagia management.   SLP Visit Diagnosis Dysphagia, unspecified (R13.10)  Attention and concentration deficit following --  Frontal lobe and executive function deficit following --  Impact on safety and function Mild aspiration risk;Moderate aspiration risk      CHL IP TREATMENT RECOMMENDATION 04/07/2019  Treatment Recommendations Therapy as outlined in treatment plan below     Prognosis 04/07/2019  Prognosis for Safe Diet Advancement Good  Barriers to Reach Goals --  Barriers/Prognosis Comment --  CHL IP DIET RECOMMENDATION 04/07/2019  SLP Diet Recommendations Dysphagia 3 (Mech soft) solids;Thin liquid  Liquid Administration via Straw  Medication Administration Whole meds with puree  Compensations Clear  throat after each swallow;Follow solids with liquid;Chin tuck;Use straw to facilitate chin tuck  Postural Changes Seated upright at 90 degrees;Remain semi-upright after after feeds/meals (Comment)      CHL IP OTHER RECOMMENDATIONS 04/07/2019  Recommended Consults --  Oral Care Recommendations Oral care BID  Other Recommendations --      CHL IP FOLLOW UP RECOMMENDATIONS 04/07/2019  Follow up Recommendations Home health SLP      CHL IP FREQUENCY AND DURATION 04/07/2019  Speech Therapy Frequency (ACUTE ONLY) min 2x/week  Treatment Duration 2 weeks           CHL IP ORAL PHASE 04/07/2019  Oral Phase WFL  Oral - Pudding Teaspoon --  Oral - Pudding Cup --  Oral - Honey Teaspoon --  Oral - Honey Cup --  Oral - Nectar Teaspoon --  Oral - Nectar Cup --  Oral - Nectar Straw --  Oral - Thin Teaspoon --  Oral - Thin Cup --  Oral - Thin Straw --  Oral - Puree --  Oral - Mech Soft --  Oral - Regular --  Oral - Multi-Consistency --  Oral - Pill --  Oral Phase - Comment --    CHL IP PHARYNGEAL PHASE 04/07/2019  Pharyngeal Phase Impaired  Pharyngeal- Pudding Teaspoon --  Pharyngeal --  Pharyngeal- Pudding Cup --  Pharyngeal --  Pharyngeal- Honey Teaspoon --  Pharyngeal --  Pharyngeal- Honey Cup --  Pharyngeal --  Pharyngeal- Nectar Teaspoon --  Pharyngeal --  Pharyngeal- Nectar Cup NT  Pharyngeal --  Pharyngeal- Nectar Straw NT  Pharyngeal --  Pharyngeal- Thin Teaspoon --  Pharyngeal --  Pharyngeal- Thin Cup Delayed swallow initiation-pyriform sinuses;Reduced epiglottic inversion;Reduced airway/laryngeal closure;Reduced tongue base retraction;Penetration/Aspiration before swallow;Penetration/Aspiration during swallow;Pharyngeal residue - pyriform;Trace aspiration  Pharyngeal Material enters airway, passes BELOW cords then ejected out  Pharyngeal- Thin Straw Delayed swallow initiation-pyriform sinuses;Reduced epiglottic inversion;Reduced airway/laryngeal closure;Reduced tongue  base retraction;Penetration/Aspiration before swallow;Penetration/Aspiration during swallow;Pharyngeal residue - pyriform;Compensatory strategies attempted (with notebox)  Pharyngeal Material enters airway, CONTACTS cords and then ejected out  Pharyngeal- Puree Reduced epiglottic inversion;Delayed swallow initiation-vallecula;Pharyngeal residue - pyriform;Lateral channel residue;Compensatory strategies attempted (with notebox)  Pharyngeal Material enters airway, remains ABOVE vocal cords then ejected out  Pharyngeal- Mechanical Soft --  Pharyngeal --  Pharyngeal- Regular NT  Pharyngeal --  Pharyngeal- Multi-consistency --  Pharyngeal --  Pharyngeal- Pill --  Pharyngeal --  Pharyngeal Comment --     No flowsheet data found.    Osie Bond., M.A. [place] [facility] Pager [phone] Office [phone]  04/07/2019, 4:10 PM Addended on 04/09/19 after further review of imaging

## 2019-04-07 NOTE — TOC Initial Note (Addendum)
Transition of Care [facility]) - Initial/Assessment Note    Patient Details  Name: Jesus Cruz MRN: [id number] Date of Birth: Aug 30, 1965  [facility]) CM/SW Contact:    Bethena Roys, RN Phone Number: 04/07/2019, 11:13 AM  Clinical Narrative:  Patient presented for with complaint of chest pain, shortness of breath, diaphoresis- found to have renal failure hyperkalemia, and DKA. 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 Patient has personal care services in the home for his mother 24/7 now. Prior to he only had PCS in the home for 8 hours and then he had the night shift. Case Manager discussed that at this point he may need to keep the agency at 24/7 until he can get back to baseline. Case Manager discussed home health care with the patient and he is agreeable. Medicare.gov list provided and patient chose [facility]- unable to staff the patient, [facility] was second option and unable to staff. Case Manager reached out to [facility] this am and they will be able to staff the patient for RN- wound care toes and heels and PT for re-conditioning. Patient will need RN orders with wound care orders in the comment section and PT orders with [facility]. Case Manager will continue to follow for additional transition of care needs.                 04-07-19 1552 Case Manager unable to staff the patient in [place] with speech therapy. Case Manager did alert home health agency to have the RN work with patient and to also discuss with primary care provider regarding speech needs.   Expected Discharge Plan: [place] Barriers to Discharge: No Barriers Identified   Patient Goals and CMS Choice Patient states their goals for this hospitalization and ongoing recovery are:: "to return home" CMS Medicare.gov Compare Post Acute Care list provided to:: Patient Choice offered to / list presented to : Patient  Expected Discharge  Plan and Services Expected Discharge Plan: [place] In-house Referral: NA Discharge Planning Services: CM Consult Post Acute Care Choice: [place] arrangements for the past 2 months: Single Family Home                   DME Agency: NA       HH Arranged: RN, Disease Management, PT(RN for wound care- PT for conditioning.) [place] Agency: [place] ([place]) Date HH Agency Contacted: 04/07/19 Time [place]: 1112 Representative spoke with at [place]: Butch Penny  Prior Living Arrangements/Services Living arrangements for the past 2 months: [place] with:: Parents(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) Patient language and need for interpreter reviewed:: Yes Do you feel safe going back to the place where you live?: Yes      Need for Family Participation in Patient Care: Yes (Comment) Care giver support system in place?: Yes (comment)   Criminal Activity/Legal Involvement Pertinent to Current Situation/Hospitalization: No - Comment as needed  Activities of Daily Living Home Assistive Devices/Equipment: None, Wheelchair, Environmental consultant (specify type) ADL Screening (condition at time of admission) Patient's cognitive ability adequate to safely complete daily activities?: No Is the patient deaf or have difficulty hearing?: No Does the patient have difficulty seeing, even when wearing glasses/contacts?: Yes Does the patient have difficulty concentrating, remembering, or making decisions?: Yes Patient able to express need for assistance with ADLs?: No Does the patient have difficulty dressing or bathing?:  Yes Independently performs ADLs?: No Communication: Independent Dressing (OT): Independent Grooming: Independent Feeding: Independent Bathing: Independent with device (comment) Toileting: Independent with device (comment) In/Out Bed: Independent with device (comment) Walks in Home: Independent with device (comment) Does the  patient have difficulty walking or climbing stairs?: Yes Weakness of Legs: Both Weakness of Arms/Hands: None  Permission Sought/Granted Permission sought to share information with : Family Supports, Chartered certified accountant granted to share information with : Yes, Verbal Permission Granted     Permission granted to share info w AGENCY: [facility]        Emotional Assessment Appearance:: Appears stated age Attitude/Demeanor/Rapport: Engaged Affect (typically observed): Accepting Orientation: : Oriented to Situation, Oriented to  Time, Oriented to Place, Oriented to Self Alcohol / Substance Use: Not Applicable Psych Involvement: No (comment)  Admission diagnosis:  Hyperkalemia [E87.5] Acute pulmonary edema (HCC) [J81.0] AKI (acute kidney injury) ([place]) [N17.9] Acute renal failure, unspecified acute renal failure type [facility]) [N17.9] Patient Active Problem List   Diagnosis Date Noted  . Pressure injury of skin 04/01/2019  . Acute pulmonary edema (HCC)   . STEMI involving oth coronary artery of inferior wall ([place])   . Hyperkalemia 03/30/2019  . Hyperglycemia 03/30/2019  . Chest pain with moderate risk for cardiac etiology 03/30/2019  . Acute renal failure with acute tubular necrosis superimposed on stage 3a chronic kidney disease ([place])   . Stroke with cerebral ischemia ([place]) 09/16/2013   PCP:  Garvin Fila, MD Pharmacy:   [id number] - [place], [address] [address] Phone: [phone] Fax: [phone]     Social Determinants of Health (SDOH) Interventions    Readmission Risk Interventions No flowsheet data found.

## 2019-04-07 NOTE — Progress Notes (Signed)
Occupational Therapy Treatment Patient Details Name: Jesus Cruz MRN: [id number] DOB: 1965-03-31 Today's Date: 04/07/2019    History of present illness 54 year old male with past medical history significant for CAD, hypertension, and prior CVA, admitted with complaints of chest pain as a code STEMI found to have hyperkalemia, acute renal failure, and DKA. Pt uinderwent LHC on 3/24 with recommendation for medical management.   OT comments  Patient supine in bed and agreeable to OT.  Engaged in ADL session at sink, completing grooming, bathing, dressing with min guard to supervision- transitioning from sitting in wc to standing as needed to manage R Knee pain and fatigue.  Patient demonstrates good awareness of safety and deficits.  He utilizes crutches to complete mobility from sink to recliner with close supervision.  Will follow acutely.  DC plan remains appropriate.     Follow Up Recommendations  No OT follow up    Equipment Recommendations  None recommended by OT    Recommendations for Other Services      Precautions / Restrictions Precautions Precautions: Fall Precaution Comments: R knee known to buckle Restrictions Weight Bearing Restrictions: No       Mobility Bed Mobility Overal bed mobility: Modified Independent                Transfers Overall transfer level: Needs assistance Equipment used: Crutches;Pushed w/c Transfers: Sit to/from Stand;Stand Pivot Transfers Sit to Stand: Supervision Stand pivot transfers: Supervision       General transfer comment: pt transferred from EOB to wc with close supervision; pt managed wc to sink with supervision; func mobility from sink to recliner using crutches with min guard to close supervision    Balance Overall balance assessment: Needs assistance Sitting-balance support: Feet unsupported;No upper extremity supported Sitting balance-Leahy Scale: Good     Standing balance support: No upper extremity supported;During  functional activity;Single extremity supported;Bilateral upper extremity supported Standing balance-Leahy Scale: Fair Standing balance comment: min guard to close supervision dynamically                            ADL either performed or assessed with clinical judgement   ADL Overall ADL's : Needs assistance/impaired     Grooming: Wash/dry hands;Wash/dry face;Oral care;Brushing hair;Standing;Sitting;Supervision/safety Grooming Details (indicate cue type and reason): standing at sink, sitting as needed due to fatigue/pain  Upper Body Bathing: Supervision/ safety;Sitting;Standing   Lower Body Bathing: Supervison/ safety;Sit to/from stand Lower Body Bathing Details (indicate cue type and reason): increased time and effort, sitting as needed due to fatigue  Upper Body Dressing : Set up;Sitting Upper Body Dressing Details (indicate cue type and reason): donning new gown Lower Body Dressing: Min guard Lower Body Dressing Details (indicate cue type and reason): donning socks in long sitting with independence, min guard sit to stand  Toilet Transfer: Supervision/safety;Ambulation(crutches, simulated to recliner )   Toileting- Clothing Manipulation and Hygiene: Min guard;Supervision/safety;Sit to/from stand Toileting - Clothing Manipulation Details (indicate cue type and reason): simulated at sink during bathing, 1 UE support preference      Functional mobility during ADLs: Min guard;Supervision/safety General ADL Comments: pt limited by pain, decreased activity tolerance       Vision       Perception     Praxis      Cognition Arousal/Alertness: Awake/alert Behavior During Therapy: WFL for tasks assessed/performed Overall Cognitive Status: Within Functional Limits for tasks assessed  Exercises     Shoulder Instructions       General Comments on RA, VSS    Pertinent Vitals/ Pain       Pain Assessment:  0-10 Pain Score: 9  Pain Location: R knee with mobility/ambulation Pain Descriptors / Indicators: Grimacing;Guarding;Discomfort Pain Intervention(s): Monitored during session;Limited activity within patient's tolerance;Repositioned  Home Living                                          Prior Functioning/Environment              Frequency  Min 2X/week        Progress Toward Goals  OT Goals(current goals can now be found in the care plan section)  Progress towards OT goals: Progressing toward goals  Acute Rehab OT Goals Patient Stated Goal: move better, "do more for myself" OT Goal Formulation: With patient  Plan Discharge plan remains appropriate;Frequency remains appropriate    Co-evaluation                 AM-PAC OT "6 Clicks" Daily Activity     Outcome Measure   Help from another person eating meals?: None Help from another person taking care of personal grooming?: A Little Help from another person toileting, which includes using toliet, bedpan, or urinal?: A Little Help from another person bathing (including washing, rinsing, drying)?: A Little Help from another person to put on and taking off regular upper body clothing?: A Little Help from another person to put on and taking off regular lower body clothing?: A Little 6 Click Score: 19    End of Session Equipment Utilized During Treatment: Other (comment)(crutches, wc)  OT Visit Diagnosis: Unsteadiness on feet (R26.81);Pain Pain - Right/Left: Right Pain - part of body: Knee   Activity Tolerance Patient tolerated treatment well   Patient Left in chair;with call bell/phone within reach;with nursing/sitter in room   Nurse Communication Mobility status        Time: [id number] OT Time Calculation (min): 34 min  Charges: OT General Charges $OT Visit: 1 Visit OT Treatments $Self Care/Home Management : 23-37 mins  [place] Pager  [phone] Office [phone]    Delight Stare 04/07/2019, 9:23 AM

## 2019-04-07 NOTE — Progress Notes (Signed)
Physical Therapy Treatment Patient Details Name: Jesus Cruz MRN: [id number] DOB: Aug 24, 1965 Today's Date: 04/07/2019    History of Present Illness 54 year old male with past medical history significant for CAD, hypertension, and prior CVA, admitted with complaints of chest pain as a code STEMI found to have hyperkalemia, acute renal failure, and DKA. Pt uinderwent LHC on 3/24 with recommendation for medical management.    PT Comments    Pt did very well with mobility this pm, was able to complete more with slightly less assistance. Able to complete bed mob with mod I, able to sit unsupported edge of bed with good trunk control and balance. Able to transfer with sba and B crutches, and ambulated approx 24ft with B crutches and min guard assist, with 1 standing rest break. Pt was on room air throughout and vital signs were stable.      Follow Up Recommendations  Home health PT;Supervision for mobility/OOB     Equipment Recommendations  None recommended by PT    Recommendations for Other Services       Precautions / Restrictions Precautions Precautions: Fall Precaution Comments: R knee known to buckle Restrictions Weight Bearing Restrictions: No    Mobility  Bed Mobility Overal bed mobility: Modified Independent                Transfers Overall transfer level: Needs assistance Equipment used: Crutches;Pushed w/c Transfers: Sit to/from Stand;Stand Pivot Transfers Sit to Stand: Supervision Stand pivot transfers: Supervision          Ambulation/Gait Ambulation/Gait assistance: Min guard Gait Distance (Feet): 250 Feet Assistive device: Crutches Gait Pattern/deviations: Step-through pattern;Trunk flexed;Ataxic Gait velocity: dec   General Gait Details: 1 standing rest break to complete distance   Stairs             Wheelchair Mobility    Modified Rankin (Stroke Patients Only)       Balance Overall balance assessment: Needs  assistance Sitting-balance support: Feet unsupported;No upper extremity supported Sitting balance-Leahy Scale: Good     Standing balance support: During functional activity;Bilateral upper extremity supported Standing balance-Leahy Scale: Fair                              Cognition Arousal/Alertness: Awake/alert Behavior During Therapy: WFL for tasks assessed/performed Overall Cognitive Status: Within Functional Limits for tasks assessed                                        Exercises      General Comments General comments (skin integrity, edema, etc.): on RA with VSS      Pertinent Vitals/Pain Pain Assessment: Faces Faces Pain Scale: Hurts even more Pain Location: RLE with increased ambulation distance Pain Descriptors / Indicators: Guarding;Grimacing;Discomfort Pain Intervention(s): Limited activity within patient's tolerance;Monitored during session    Home Living                      Prior Function            PT Goals (current goals can now be found in the care plan section) Acute Rehab PT Goals Patient Stated Goal: wants to go home and be able to take care of his mother PT Goal Formulation: With patient Time For Goal Achievement: 04/15/19 Potential to Achieve Goals: Good Progress towards PT goals: Progressing toward goals    Frequency  Min 3X/week      PT Plan Current plan remains appropriate    Co-evaluation              AM-PAC PT "6 Clicks" Mobility   Outcome Measure  Help needed turning from your back to your side while in a flat bed without using bedrails?: None Help needed moving from lying on your back to sitting on the side of a flat bed without using bedrails?: A Little Help needed moving to and from a bed to a chair (including a wheelchair)?: A Little Help needed standing up from a chair using your arms (e.g., wheelchair or bedside chair)?: A Little Help needed to walk in hospital room?: A  Little Help needed climbing 3-5 steps with a railing? : A Lot 6 Click Score: 18    End of Session Equipment Utilized During Treatment: Gait belt Activity Tolerance: Patient limited by fatigue;Patient limited by pain Patient left: in bed;with call bell/phone within reach Nurse Communication: Mobility status PT Visit Diagnosis: Other abnormalities of gait and mobility (R26.89);Muscle weakness (generalized) (M62.81);Pain Pain - Right/Left: Right Pain - part of body: Knee     Time: 1340-1411 PT Time Calculation (min) (ACUTE ONLY): 31 min  Charges:  $Gait Training: 8-22 mins $Therapeutic Activity: 8-22 mins                     Horald Chestnut, PT    Delford Field 04/07/2019, 3:52 PM

## 2019-04-07 NOTE — Progress Notes (Signed)
Inpatient Diabetes Program Recommendations  AACE/ADA: New Consensus Statement on Inpatient Glycemic Control (2015)  Target Ranges:  Prepandial:   less than 140 mg/dL      Peak postprandial:   less than 180 mg/dL (1-2 hours)      Critically ill patients:  140 - 180 mg/dL   Lab Results  Component Value Date   GLUCAP 217 (H) 04/07/2019   HGBA1C 12.0 (H) 03/30/2019    Review of Glycemic Control  Diabetes history: DM2 Outpatient Diabetes medications: Amaryl 1-2 mg QHS Current orders for Inpatient glycemic control: Levemir 10 units bid, Novolog 0-20 units tidwc and hs + 4 units tidwc  Inpatient Diabetes Program Recommendations  For discharge:  Levemir 20 units QD Amaryl 4 mg QD  Reviewed insulin pen administration with pt. States he feels comfortable going home on insulin. Discussed importance of checking blood sugars1-2x/day and reviewed lifestyle recs for weight loss and increased activity. Answered questions. Pt voiced understanding.   Will need prescription for Levemir pen, insulin pen needles (#[id number]) and glucose meter kit (#[id number])  Thank you. Lorenda Peck, RD, LDN, CDE Inpatient Diabetes Coordinator [phone]

## 2019-04-14 ENCOUNTER — Ambulatory Visit: Payer: Self-pay | Admitting: Neurology

## 2019-04-28 NOTE — Progress Notes (Signed)
Cardiology Office Note    Date:  05/04/2019   ID:  Jesus Cruz, DOB [birth year], MRN [id number]  PCP:  Charlotte Sanes, MD  Cardiologist: No primary care provider on file. has seen Dr. Lennox Pippins in [place] in 2018 but wants to come to [facility] EPS: None  Chief Complaint  Patient presents with  . Hospitalization Follow-up    History of Present Illness:  Jesus Cruz is a 54 y.o. male with a hx of CAD s/p inf STEMI w/ Integrity BMS RCA 04/2012, DOE with negative CT-PE protocol, normal EF on echo, probable OHS, HTN, obesity, CVA, OSA on CPAP, Hx PE 05/2012 (completed coumadin), gout, MGUS, COPD,   Patient was admitted with chest pain and altered mental status and found to be in DKA. Cardiac cath 03/31/19 with occlusion of a small PDA, other vessels patent.no intervention Continue aspirin and Plavix beta-blocker and restart statin .  Echo with normal LVEF. Also AKI on CKD, Hyperkalemia , low dose statin b/c he was on fluconazole at that time.Crt 1.31 at discharge.  Patient comes in for f/u. Denies chest pain, dyspnea, dizziness or presyncope. Complains of headache and BP running high. Saw PCP last week and didn't restart lisinopril or cardura. Labs showed kidney's stable but K and LFT's elevated.     Past Medical History:  Diagnosis Date  . CVA (cerebral vascular accident) ([place])   . Gout   . Hypertension   . MGUS (monoclonal gammopathy of unknown significance)   . Obesity   . OSA on CPAP   . Pulmonary embolus ([place]) 05/2012   During recovery from MI, completed Coumadin  . Stage 3 chronic kidney disease   . STEMI (ST elevation myocardial infarction) ([place]) 04/2012   Integrity BMS RCA with minor left system disease    Past Surgical History:  Procedure Laterality Date  . CORONARY ANGIOPLASTY WITH STENT PLACEMENT  04/2012   Integrity BMS RCA, minor L system dz  . LEFT HEART CATH AND CORONARY ANGIOGRAPHY N/A 03/31/2019   Procedure: LEFT HEART CATH AND CORONARY  ANGIOGRAPHY;  Surgeon: Leonie Man, MD;  Location: [facility];  Service: Cardiovascular;  Laterality: N/A;  . RADIOLOGY WITH ANESTHESIA N/A 09/16/2013   Procedure: RADIOLOGY WITH ANESTHESIA;  Surgeon: Rob Hickman, MD;  Location: [facility];  Service: Radiology;  Laterality: N/A;    Current Medications: Current Meds  Medication Sig  . allopurinol (ZYLOPRIM) 300 MG tablet Take 300 mg by mouth daily.  Marland Kitchen amLODipine (NORVASC) 10 MG tablet Take 10 mg by mouth at bedtime.   Marland Kitchen aspirin EC 81 MG tablet Take 81 mg by mouth at bedtime.  Marland Kitchen atorvastatin (LIPITOR) 10 MG tablet Take 10 mg by mouth at bedtime.   . benzonatate (TESSALON) 200 MG capsule Take 1 capsule (200 mg total) by mouth 3 (three) times daily as needed for cough.  . blood glucose meter kit and supplies Dispense based on patient and insurance preference. Use up to four times daily as directed. (FOR ICD-10 E10.9, E11.9).  Marland Kitchen clopidogrel (PLAVIX) 75 MG tablet Take 1 tablet (75 mg total) by mouth daily.  . colchicine 0.6 MG tablet Take 0.6 mg by mouth at bedtime as needed (gout).   . cyclobenzaprine (FLEXERIL) 10 MG tablet Take 1 tablet (10 mg total) by mouth 3 (three) times daily as needed for muscle spasms.  . furosemide (LASIX) 20 MG tablet Take 20 mg by mouth daily as needed for fluid.   Marland Kitchen glimepiride (AMARYL) 4 MG tablet  Take 1 tablet (4 mg total) by mouth daily with breakfast.  . HYDROcodone-acetaminophen (NORCO/VICODIN) 5-325 MG tablet Take 1 tablet by mouth 2 (two) times daily as needed for pain.  . Insulin Pen Needle 31G X 5 MM MISC Use with Levemir flexpen  . LANTUS SOLOSTAR 100 UNIT/ML Solostar Pen Inject 10 Units into the skin daily.  . metoCLOPramide (REGLAN) 5 MG tablet Take 1 tablet (5 mg total) by mouth 3 (three) times daily before meals.  . metoprolol succinate (TOPROL-XL) 50 MG 24 hr tablet Take 3 tablets (150 mg total) by mouth daily.  Marland Kitchen morphine (MS CONTIN) 15 MG 12 hr tablet Take 15 mg by mouth 3 (three) times  daily.  . naloxone (NARCAN) nasal spray 4 mg/0.1 mL Place 1 spray into the nose once.   . nitroGLYCERIN (NITROSTAT) 0.4 MG SL tablet Place 0.4 mg under the tongue every 5 (five) minutes as needed for chest pain.  . pantoprazole (PROTONIX) 40 MG tablet Take 1 tablet (40 mg total) by mouth 2 (two) times daily.  . predniSONE (DELTASONE) 10 MG tablet Take 10 mg by mouth daily as needed.  . pregabalin (LYRICA) 100 MG capsule Take 100 mg by mouth 3 (three) times daily.   Marland Kitchen triamcinolone cream (KENALOG) 0.5 % Apply 1 application topically 3 (three) times daily. APPLY FOR 4 WEEKS STOP FOR 1 WEEK & REPEAT AS NEEDED     Allergies:   Other   Social History   Socioeconomic History  . Marital status: Single    Spouse name: Not on file  . Number of children: Not on file  . Years of education: Not on file  . Highest education level: Not on file  Occupational History  . Not on file  Tobacco Use  . Smoking status: Current Some Day Smoker    Types: Cigarettes  . Smokeless tobacco: Never Used  Substance and Sexual Activity  . Alcohol use: No  . Drug use: Never  . Sexual activity: Never  Other Topics Concern  . Not on file  Social History Narrative  . Not on file   Social Determinants of Health   Financial Resource Strain:   . Difficulty of Paying Living Expenses:   Food Insecurity:   . Worried About Charity fundraiser in the Last Year:   . Arboriculturist in the Last Year:   Transportation Needs:   . Film/video editor (Medical):   Marland Kitchen Lack of Transportation (Non-Medical):   Physical Activity:   . Days of Exercise per Week:   . Minutes of Exercise per Session:   Stress:   . Feeling of Stress :   Social Connections:   . Frequency of Communication with Friends and Family:   . Frequency of Social Gatherings with Friends and Family:   . Attends Religious Services:   . Active Member of Clubs or Organizations:   . Attends Archivist Meetings:   Marland Kitchen Marital Status:       Family History:  The patient's   family history is not on file.   ROS:   Please see the history of present illness.    ROS All other systems reviewed and are negative.   PHYSICAL EXAM:   VS:  BP (!) 156/94 (BP Location: Right Arm, Patient Position: Sitting, Cuff Size: Large)   Pulse 81   Ht '5\' 7"'  (1.702 m)   Wt (!) 312 lb (141.5 kg)   SpO2 97%   BMI 48.87 kg/m  Physical Exam  GEN: Obese, in no acute distress  Neck: no JVD, carotid bruits, or masses Cardiac:RRR; no murmurs, rubs, or gallops  Respiratory:  clear to auscultation bilaterally, normal work of breathing GI: soft, nontender, nondistended, + BS Ext: plus 1-2 edema, bilaterally,without cyanosis, clubbing, or edema, Good distal pulses bilaterally Neuro:  Alert and Oriented x 3 Psych: euthymic mood, full affect  Wt Readings from Last 3 Encounters:  05/04/19 (!) 312 lb (141.5 kg)  04/07/19 (!) 304 lb 7.3 oz (138.1 kg)  09/17/13 277 lb 14.4 oz (126.1 kg)      Studies/Labs Reviewed:   EKG:  EKG is not ordered today.   Recent Labs: 03/30/2019: B Natriuretic Peptide 549.1 04/02/2019: ALT 32 04/07/2019: BUN 33; Creatinine, Ser 1.31; Hemoglobin 11.9; Magnesium 1.9; Platelets 299; Potassium 4.5; Sodium 132   Lipid Panel    Component Value Date/Time   CHOL 118 04/01/2019 0305   TRIG 193 (H) 04/01/2019 0305   HDL 27 (L) 04/01/2019 0305   CHOLHDL 4.4 04/01/2019 0305   VLDL 39 04/01/2019 0305   LDLCALC 52 04/01/2019 0305    Additional studies/ records that were reviewed today include:   Cardiac catheterization 03/31/19   LIKELY CULPRIT LESION: Mid PDA (small caliber) 100% -> not good PCI target.  Prox RCA lesion is 30% stenosed.  Previously placed Prox RCA to Mid RCA stent (unknown type) is widely patent.  Mid RCA lesion is 60% stenosed.  1st Diag/Ramus lesion is 40% stenosed.  Ramus/OM lesion is 50% stenosed.  1st Mrg/Distal LCx lesion is 60% stenosed.  LV end diastolic pressure is normal.    POST-OPERATIVE DIAGNOSIS:    Likely culprit lesion is mid PDA (small caliber) occlusion -> plan medical management  Otherwise patent RCA stent with diffuse moderate mid RCA disease (60%), 46% stenosis in tandem ramus branches and OM1  Normal LVEDP with systemic hypotension (blood pressures ranging from high 80s to low 90s) -patient mentating well.  Does not appear to be true shock (likely related to sedation)  Hyperdynamic cardiac motion (very difficult to engage coronary arteries)  Large patulous left main with 4 major branches   PLAN OF CARE:   Transfer back to MICU (2 M) -> we will restart IV heparin 8 hours after sheath removal. ? Would run IV heparin for total 72 hours including last night.  As needed pain meds  We will defer to consulting team, but could consider clopidogrel (we will load today)  We will defer management of renal failure to primary team/nephrology.     Echo 03/31/19:   1. Small area of inferior basal hypokinesis. Left ventricular ejection  fraction, by estimation, is 55 to 60%. The left ventricle has normal  function. Left ventricular endocardial border not optimally defined to  evaluate regional wall motion. There is  mild left ventricular hypertrophy. Left ventricular diastolic parameters  were normal.   2. Right ventricular systolic function is normal. The right ventricular  size is normal.   3. The mitral valve was not well visualized. No evidence of mitral valve  regurgitation. No evidence of mitral stenosis.   4. The aortic valve was not well visualized. Aortic valve regurgitation  is not visualized. No aortic stenosis is present.   5. The inferior vena cava is normal in size with greater than 50%  respiratory variability, suggesting right atrial pressure of 3 mmHg.       ASSESSMENT:    1. Coronary artery disease involving native coronary artery of native heart without angina  pectoris   2. Essential hypertension   3. Hyperlipidemia,  unspecified hyperlipidemia type   4. Type 2 diabetes mellitus with complication, with long-term current use of insulin (HCC)   5. Stage 3 chronic kidney disease, unspecified whether stage 3a or 3b CKD   6. History of CVA (cerebrovascular accident)   7. OSA (obstructive sleep apnea)      PLAN:  In order of problems listed above:  CAD status post inferior STEMI with integrity BMS to the RCA 04/2012, STEMI with cath 03/31/19 with small occlusion with small PDA, no intervention, echo with normal LV systolic function EF 55 to 60% continue aspirin statin Plavix and Toprol  Essential hypertension BP running high and lisinopril and cardura still on hold. Called PCP and labs last week K 5.6, Crt. 1.22-discussed with pharmacy-will start hydralazine 25 mg tid. F/u in 2-3 weeks   Hyperlipidemia on low dose statin   CKD stage III Crt 1.22  Diabetes mellitus with DKA 03/30/19  History of CVA  OSA on CPAP  History of PE 2014  Medication Adjustments/Labs and Tests Ordered: Current medicines are reviewed at length with the patient today.  Concerns regarding medicines are outlined above.  Medication changes, Labs and Tests ordered today are listed in the Patient Instructions below. Patient Instructions  Medication Instructions:  Your physician has recommended you make the following change in your medication: 1. Start Hydralazine one tablet (25 mg) three times daily. Sent in today to requested pharmacy # [id number].   *If you need a refill on your cardiac medications before your next appointment, please call your pharmacy*   Lab Work: -None If you have labs (blood work) drawn today and your tests are completely normal, you will receive your results only by: Marland Kitchen MyChart Message (if you have MyChart) OR . A paper copy in the mail If you have any lab test that is abnormal or we need to change your treatment, we will call you to review the results.   Testing/Procedures: -None   Follow-Up:  Your  physician recommends that you keep your scheduled  follow-up appointment on May 24 @ 1:40 pm.   At [facility], you and your health needs are our priority.  As part of our continuing mission to provide you with exceptional heart care, we have created designated Provider Care Teams.  These Care Teams include your primary Cardiologist (physician) and Advanced Practice Providers (APPs -  Physician Assistants and Nurse Practitioners) who all work together to provide you with the care you need, when you need it.  We recommend signing up for the patient portal called "MyChart".  Sign up information is provided on this After Visit Summary.  MyChart is used to connect with patients for Virtual Visits (Telemedicine).  Patients are able to view lab/test results, encounter notes, upcoming appointments, etc.  Non-urgent messages can be sent to your provider as well.   To learn more about what you can do with MyChart, go to [email or web address].           Sumner Boast, PA-C  05/04/2019 11:39 AM    [facility] [address], [place], [place]  [address] Phone: [phone]; Fax: [phone]

## 2019-05-04 ENCOUNTER — Encounter: Payer: Self-pay | Admitting: Physician Assistant

## 2019-05-04 ENCOUNTER — Telehealth: Payer: Self-pay | Admitting: *Deleted

## 2019-05-04 ENCOUNTER — Ambulatory Visit: Payer: Medicare Other | Admitting: Physician Assistant

## 2019-05-04 ENCOUNTER — Other Ambulatory Visit: Payer: Self-pay

## 2019-05-04 VITALS — BP 156/94 | HR 81 | Ht 67.0 in | Wt 312.0 lb

## 2019-05-04 DIAGNOSIS — I2510 Atherosclerotic heart disease of native coronary artery without angina pectoris: Secondary | ICD-10-CM | POA: Diagnosis not present

## 2019-05-04 DIAGNOSIS — G4733 Obstructive sleep apnea (adult) (pediatric): Secondary | ICD-10-CM

## 2019-05-04 DIAGNOSIS — I10 Essential (primary) hypertension: Secondary | ICD-10-CM | POA: Diagnosis not present

## 2019-05-04 DIAGNOSIS — E118 Type 2 diabetes mellitus with unspecified complications: Secondary | ICD-10-CM | POA: Diagnosis not present

## 2019-05-04 DIAGNOSIS — E785 Hyperlipidemia, unspecified: Secondary | ICD-10-CM

## 2019-05-04 DIAGNOSIS — N183 Chronic kidney disease, stage 3 unspecified: Secondary | ICD-10-CM

## 2019-05-04 DIAGNOSIS — Z8673 Personal history of transient ischemic attack (TIA), and cerebral infarction without residual deficits: Secondary | ICD-10-CM

## 2019-05-04 DIAGNOSIS — Z794 Long term (current) use of insulin: Secondary | ICD-10-CM

## 2019-05-04 MED ORDER — HYDRALAZINE HCL 25 MG PO TABS: 25.0000 mg | ORAL_TABLET | Freq: Three times a day (TID) | ORAL | 2 refills | Status: DC

## 2019-05-04 NOTE — Telephone Encounter (Signed)
S/w Dr. Spero Curb office to get recent labs faxed to office @ [phone].

## 2019-05-04 NOTE — Patient Instructions (Addendum)
Medication Instructions:  Your physician has recommended you make the following change in your medication: 1. Start Hydralazine one tablet (25 mg) three times daily. Sent in today to requested pharmacy # [id number].   *If you need a refill on your cardiac medications before your next appointment, please call your pharmacy*   Lab Work: -None If you have labs (blood work) drawn today and your tests are completely normal, you will receive your results only by: Marland Kitchen MyChart Message (if you have MyChart) OR . A paper copy in the mail If you have any lab test that is abnormal or we need to change your treatment, we will call you to review the results.   Testing/Procedures: -None   Follow-Up:  Your physician recommends that you keep your scheduled  follow-up appointment on May 24 @ 1:40 pm.   At [facility], you and your health needs are our priority.  As part of our continuing mission to provide you with exceptional heart care, we have created designated Provider Care Teams.  These Care Teams include your primary Cardiologist (physician) and Advanced Practice Providers (APPs -  Physician Assistants and Nurse Practitioners) who all work together to provide you with the care you need, when you need it.  We recommend signing up for the patient portal called "MyChart".  Sign up information is provided on this After Visit Summary.  MyChart is used to connect with patients for Virtual Visits (Telemedicine).  Patients are able to view lab/test results, encounter notes, upcoming appointments, etc.  Non-urgent messages can be sent to your provider as well.   To learn more about what you can do with MyChart, go to [email or web address].       Call office at [phone] of bp keeps running high.

## 2019-05-31 ENCOUNTER — Ambulatory Visit: Payer: Medicare Other | Admitting: Interventional Cardiology

## 2019-05-31 ENCOUNTER — Other Ambulatory Visit: Payer: Self-pay

## 2019-05-31 ENCOUNTER — Encounter: Payer: Self-pay | Admitting: Interventional Cardiology

## 2019-05-31 VITALS — BP 142/92 | HR 107 | Ht 67.0 in | Wt 311.2 lb

## 2019-05-31 DIAGNOSIS — I2510 Atherosclerotic heart disease of native coronary artery without angina pectoris: Secondary | ICD-10-CM | POA: Diagnosis not present

## 2019-05-31 DIAGNOSIS — E118 Type 2 diabetes mellitus with unspecified complications: Secondary | ICD-10-CM

## 2019-05-31 DIAGNOSIS — G4733 Obstructive sleep apnea (adult) (pediatric): Secondary | ICD-10-CM

## 2019-05-31 DIAGNOSIS — N183 Chronic kidney disease, stage 3 unspecified: Secondary | ICD-10-CM

## 2019-05-31 DIAGNOSIS — Z794 Long term (current) use of insulin: Secondary | ICD-10-CM

## 2019-05-31 DIAGNOSIS — Z7189 Other specified counseling: Secondary | ICD-10-CM

## 2019-05-31 DIAGNOSIS — I10 Essential (primary) hypertension: Secondary | ICD-10-CM | POA: Diagnosis not present

## 2019-05-31 DIAGNOSIS — E785 Hyperlipidemia, unspecified: Secondary | ICD-10-CM

## 2019-05-31 NOTE — Progress Notes (Signed)
Cardiology Office Note:    Date:  05/31/2019   ID:  Jesus Cruz, DOB 11/15/1965, MRN [id number]  PCP:  Charlotte Sanes, MD  Cardiologist:  No primary care provider on file.   Referring MD: Charlotte Sanes, MD   Chief Complaint  Patient presents with  . Coronary Artery Disease  . Congestive Heart Failure    History of Present Illness:    Jesus Cruz is a 54 y.o. male with a hx of a hx of CAD s/p inf STEMI w/ Integrity BMS RCA 04/2012, DOEwith negative CT-PE protocol, normal EF on echo,probable OHS, HTN, obesity, CVA, OSA on CPAP, Hx PE 05/2012 (completed coumadin), gout, MGUS,COPD,chest pain/altered mental status/DKA 03/31/2019 with occlusion of mid PDA.  He has no cardiac complaints.  No recurrence of chest pain since March.  He denies symptoms of heart failure.  No PND, lower extremity swelling, orthopnea.  He has a multitude of risk factors including diabetes, tophaceous gout, obstructive sleep apnea, type 2 diabetes that is poorly controlled, morbid obesity, and hypertension.  Lipids are under excellent control on low intensity statin therapy.   Past Medical History:  Diagnosis Date  . CVA (cerebral vascular accident) ([place])   . Gout   . Hypertension   . MGUS (monoclonal gammopathy of unknown significance)   . Obesity   . OSA on CPAP   . Pulmonary embolus ([place]) 05/2012   During recovery from MI, completed Coumadin  . Stage 3 chronic kidney disease   . STEMI (ST elevation myocardial infarction) ([place]) 04/2012   Integrity BMS RCA with minor left system disease    Past Surgical History:  Procedure Laterality Date  . CORONARY ANGIOPLASTY WITH STENT PLACEMENT  04/2012   Integrity BMS RCA, minor L system dz  . LEFT HEART CATH AND CORONARY ANGIOGRAPHY N/A 03/31/2019   Procedure: LEFT HEART CATH AND CORONARY ANGIOGRAPHY;  Surgeon: Leonie Man, MD;  Location: [facility];  Service: Cardiovascular;  Laterality: N/A;  . RADIOLOGY WITH ANESTHESIA N/A  09/16/2013   Procedure: RADIOLOGY WITH ANESTHESIA;  Surgeon: Rob Hickman, MD;  Location: [facility];  Service: Radiology;  Laterality: N/A;    Current Medications: Current Meds  Medication Sig  . allopurinol (ZYLOPRIM) 300 MG tablet Take 300 mg by mouth daily.  Marland Kitchen amLODipine (NORVASC) 10 MG tablet Take 10 mg by mouth at bedtime.   Marland Kitchen aspirin EC 81 MG tablet Take 81 mg by mouth at bedtime.  Marland Kitchen atorvastatin (LIPITOR) 10 MG tablet Take 10 mg by mouth at bedtime.   . blood glucose meter kit and supplies Dispense based on patient and insurance preference. Use up to four times daily as directed. (FOR ICD-10 E10.9, E11.9).  Marland Kitchen clopidogrel (PLAVIX) 75 MG tablet Take 1 tablet (75 mg total) by mouth daily.  . colchicine 0.6 MG tablet Take 0.6 mg by mouth at bedtime as needed (gout).   . cyclobenzaprine (FLEXERIL) 10 MG tablet Take 1 tablet (10 mg total) by mouth 3 (three) times daily as needed for muscle spasms.  Marland Kitchen glimepiride (AMARYL) 4 MG tablet Take 1 tablet (4 mg total) by mouth daily with breakfast.  . hydrALAZINE (APRESOLINE) 25 MG tablet Take 1 tablet (25 mg total) by mouth 3 (three) times daily.  Marland Kitchen HYDROcodone-acetaminophen (NORCO/VICODIN) 5-325 MG tablet Take 1 tablet by mouth 2 (two) times daily as needed for pain.  . Insulin Pen Needle 31G X 5 MM MISC Use with Levemir flexpen  . LANTUS SOLOSTAR 100 UNIT/ML Solostar Pen Inject  10 Units into the skin daily.  . metoCLOPramide (REGLAN) 5 MG tablet Take 1 tablet (5 mg total) by mouth 3 (three) times daily before meals.  . metoprolol succinate (TOPROL-XL) 50 MG 24 hr tablet Take 3 tablets (150 mg total) by mouth daily.  Marland Kitchen morphine (MS CONTIN) 15 MG 12 hr tablet Take 15 mg by mouth 3 (three) times daily.  . naloxone (NARCAN) nasal spray 4 mg/0.1 mL Place 1 spray into the nose once.   . nitroGLYCERIN (NITROSTAT) 0.4 MG SL tablet Place 0.4 mg under the tongue every 5 (five) minutes as needed for chest pain.  . pantoprazole (PROTONIX) 40 MG tablet Take  1 tablet (40 mg total) by mouth 2 (two) times daily.  . predniSONE (DELTASONE) 10 MG tablet Take 10 mg by mouth daily as needed.  . pregabalin (LYRICA) 100 MG capsule Take 100 mg by mouth 3 (three) times daily.      Allergies:   Other   Social History   Socioeconomic History  . Marital status: Single    Spouse name: Not on file  . Number of children: Not on file  . Years of education: Not on file  . Highest education level: Not on file  Occupational History  . Not on file  Tobacco Use  . Smoking status: Current Some Day Smoker    Types: Cigarettes  . Smokeless tobacco: Never Used  Substance and Sexual Activity  . Alcohol use: No  . Drug use: Never  . Sexual activity: Never  Other Topics Concern  . Not on file  Social History Narrative  . Not on file   Social Determinants of Health   Financial Resource Strain:   . Difficulty of Paying Living Expenses:   Food Insecurity:   . Worried About Charity fundraiser in the Last Year:   . Arboriculturist in the Last Year:   Transportation Needs:   . Film/video editor (Medical):   Marland Kitchen Lack of Transportation (Non-Medical):   Physical Activity:   . Days of Exercise per Week:   . Minutes of Exercise per Session:   Stress:   . Feeling of Stress :   Social Connections:   . Frequency of Communication with Friends and Family:   . Frequency of Social Gatherings with Friends and Family:   . Attends Religious Services:   . Active Member of Clubs or Organizations:   . Attends Archivist Meetings:   Marland Kitchen Marital Status:      Family History: The patient's family history is not on file.  ROS:   Please see the history of present illness.    He has foot drop, secondary to lumbar disc disease.  All other systems reviewed and are negative.  EKGs/Labs/Other Studies Reviewed:    The following studies were reviewed today: Cardiac catheterization March 31, 2019: Diagnostic Dominance: Co-dominant  ECHOCARDIOGRAM  03/2019: IMPRESSIONS    1. Small area of inferior basal hypokinesis. Left ventricular ejection  fraction, by estimation, is 55 to 60%. The left ventricle has normal  function. Left ventricular endocardial border not optimally defined to  evaluate regional wall motion. There is  mild left ventricular hypertrophy. Left ventricular diastolic parameters  were normal.  2. Right ventricular systolic function is normal. The right ventricular  size is normal.  3. The mitral valve was not well visualized. No evidence of mitral valve  regurgitation. No evidence of mitral stenosis.  4. The aortic valve was not well visualized. Aortic valve  regurgitation  is not visualized. No aortic stenosis is present.  5. The inferior vena cava is normal in size with greater than 50%  respiratory variability, suggesting right atrial pressure of 3 mmHg.   EKG:  EKG not performed today.  Recent Labs: 03/30/2019: B Natriuretic Peptide 549.1 04/02/2019: ALT 32 04/07/2019: BUN 33; Creatinine, Ser 1.31; Hemoglobin 11.9; Magnesium 1.9; Platelets 299; Potassium 4.5; Sodium 132  Recent Lipid Panel    Component Value Date/Time   CHOL 118 04/01/2019 0305   TRIG 193 (H) 04/01/2019 0305   HDL 27 (L) 04/01/2019 0305   CHOLHDL 4.4 04/01/2019 0305   VLDL 39 04/01/2019 0305   LDLCALC 52 04/01/2019 0305    Physical Exam:    VS:  BP (!) 142/92   Pulse (!) 107   Ht '5\' 7"'  (1.702 m)   Wt (!) 311 lb 3.2 oz (141.2 kg)   SpO2 97%   BMI 48.74 kg/m     Wt Readings from Last 3 Encounters:  05/31/19 (!) 311 lb 3.2 oz (141.2 kg)  05/04/19 (!) 312 lb (141.5 kg)  04/07/19 (!) 304 lb 7.3 oz (138.1 kg)     GEN: Morbidly obese.. No acute distress HEENT: Normal NECK: No JVD. LYMPHATICS: No lymphadenopathy CARDIAC: RRR without murmur, gallop, or edema. VASCULAR:  Normal Pulses. No bruits. RESPIRATORY:  Clear to auscultation without rales, wheezing or rhonchi  ABDOMEN: Soft, non-tender, non-distended, No pulsatile  mass, MUSCULOSKELETAL: No deformity  SKIN: Warm and dry NEUROLOGIC:  Alert and oriented x 3 PSYCHIATRIC:  Normal affect   ASSESSMENT:    1. Coronary artery disease involving native coronary artery of native heart without angina pectoris   2. Essential hypertension   3. Hyperlipidemia, unspecified hyperlipidemia type   4. Type 2 diabetes mellitus with complication, with long-term current use of insulin (HCC)   5. Stage 3 chronic kidney disease, unspecified whether stage 3a or 3b CKD   6. OSA (obstructive sleep apnea)   7. Educated about COVID-19 virus infection    PLAN:    In order of problems listed above:  1. Secondary prevention for CAD progression discussed with the patient.  Coronary anatomy discussed. 2. Blood pressure is a little higher than we like.  Low-salt diet decreased fluid intake is encouraged.  Diuretic therapy is not a good choice for him because he has tophaceous gout and has frequent episodes of acute joint pain. 3. LDL in March was less than 70.  Continue low-dose statin therapy 4. A1c was severely out-of-control when last seen.  This is being followed and managed by his primary care, Dr. [name]. 5. Kidney disease is stable with a creatinine of 1.10 March 2019. 6. Compliant with CPAP but states he has still awakens frequently to help his mother who is bedridden. 7. COVID-19 vaccine including his entire family has occurred.  Overall education and awareness concerning secondary risk prevention was discussed in detail: LDL less than 70, hemoglobin A1c less than 7, blood pressure target less than 130/80 mmHg, >150 minutes of moderate aerobic activity per week, avoidance of smoking, weight control (via diet and exercise), and continued surveillance/management of/for obstructive sleep apnea.    Medication Adjustments/Labs and Tests Ordered: Current medicines are reviewed at length with the patient today.  Concerns regarding medicines are outlined above.  No orders  of the defined types were placed in this encounter.  No orders of the defined types were placed in this encounter.   There are no Patient Instructions on file for this  visit.   Signed, Sinclair Grooms, MD  05/31/2019 2:11 PM    [place]

## 2019-05-31 NOTE — Patient Instructions (Signed)
Medication Instructions:  Your physician recommends that you continue on your current medications as directed. Please refer to the Current Medication list given to you today.  *If you need a refill on your cardiac medications before your next appointment, please call your pharmacy*   Lab Work: None If you have labs (blood work) drawn today and your tests are completely normal, you will receive your results only by: Marland Kitchen MyChart Message (if you have MyChart) OR . A paper copy in the mail If you have any lab test that is abnormal or we need to change your treatment, we will call you to review the results.   Testing/Procedures: None   Follow-Up: At [facility], you and your health needs are our priority.  As part of our continuing mission to provide you with exceptional heart care, we have created designated Provider Care Teams.  These Care Teams include your primary Cardiologist (physician) and Advanced Practice Providers (APPs -  Physician Assistants and Nurse Practitioners) who all work together to provide you with the care you need, when you need it.  We recommend signing up for the patient portal called "MyChart".  Sign up information is provided on this After Visit Summary.  MyChart is used to connect with patients for Virtual Visits (Telemedicine).  Patients are able to view lab/test results, encounter notes, upcoming appointments, etc.  Non-urgent messages can be sent to your provider as well.   To learn more about what you can do with MyChart, go to [email or web address].    Your next appointment:   9 month(s)  The format for your next appointment:   In Person  Provider:   Kathyrn Drown, NP    Other Instructions

## 2019-06-24 ENCOUNTER — Encounter: Payer: Medicare Other | Admitting: Registered"

## 2019-06-29 ENCOUNTER — Encounter: Payer: Self-pay | Admitting: Gastroenterology

## 2019-09-01 ENCOUNTER — Ambulatory Visit: Payer: Medicare Other | Admitting: Gastroenterology

## 2019-10-19 ENCOUNTER — Emergency Department (HOSPITAL_COMMUNITY): Payer: Medicare Other

## 2019-10-19 ENCOUNTER — Encounter (HOSPITAL_COMMUNITY): Payer: Self-pay | Admitting: Internal Medicine

## 2019-10-19 ENCOUNTER — Inpatient Hospital Stay (HOSPITAL_COMMUNITY)
Admission: EM | Admit: 2019-10-19 | Discharge: 2019-10-24 | DRG: 872 | Disposition: A | Discharge status: Home Health | Payer: Medicare Other | Attending: Student | Admitting: Student

## 2019-10-19 DIAGNOSIS — D472 Monoclonal gammopathy: Secondary | ICD-10-CM | POA: Diagnosis present

## 2019-10-19 DIAGNOSIS — D72829 Elevated white blood cell count, unspecified: Secondary | ICD-10-CM | POA: Diagnosis present

## 2019-10-19 DIAGNOSIS — Z794 Long term (current) use of insulin: Secondary | ICD-10-CM

## 2019-10-19 DIAGNOSIS — Z955 Presence of coronary angioplasty implant and graft: Secondary | ICD-10-CM

## 2019-10-19 DIAGNOSIS — I2510 Atherosclerotic heart disease of native coronary artery without angina pectoris: Secondary | ICD-10-CM | POA: Diagnosis not present

## 2019-10-19 DIAGNOSIS — N1831 Chronic kidney disease, stage 3a: Secondary | ICD-10-CM | POA: Diagnosis present

## 2019-10-19 DIAGNOSIS — I10 Essential (primary) hypertension: Secondary | ICD-10-CM | POA: Diagnosis present

## 2019-10-19 DIAGNOSIS — Z86711 Personal history of pulmonary embolism: Secondary | ICD-10-CM

## 2019-10-19 DIAGNOSIS — Z7952 Long term (current) use of systemic steroids: Secondary | ICD-10-CM

## 2019-10-19 DIAGNOSIS — E872 Acidosis: Secondary | ICD-10-CM | POA: Diagnosis not present

## 2019-10-19 DIAGNOSIS — Z6841 Body Mass Index (BMI) 40.0 and over, adult: Secondary | ICD-10-CM | POA: Diagnosis not present

## 2019-10-19 DIAGNOSIS — M7989 Other specified soft tissue disorders: Secondary | ICD-10-CM | POA: Diagnosis not present

## 2019-10-19 DIAGNOSIS — E1140 Type 2 diabetes mellitus with diabetic neuropathy, unspecified: Secondary | ICD-10-CM | POA: Diagnosis present

## 2019-10-19 DIAGNOSIS — G4733 Obstructive sleep apnea (adult) (pediatric): Secondary | ICD-10-CM | POA: Diagnosis present

## 2019-10-19 DIAGNOSIS — G8929 Other chronic pain: Secondary | ICD-10-CM | POA: Diagnosis present

## 2019-10-19 DIAGNOSIS — M545 Low back pain, unspecified: Secondary | ICD-10-CM | POA: Diagnosis present

## 2019-10-19 DIAGNOSIS — I129 Hypertensive chronic kidney disease with stage 1 through stage 4 chronic kidney disease, or unspecified chronic kidney disease: Secondary | ICD-10-CM | POA: Diagnosis present

## 2019-10-19 DIAGNOSIS — G894 Chronic pain syndrome: Secondary | ICD-10-CM | POA: Diagnosis not present

## 2019-10-19 DIAGNOSIS — A419 Sepsis, unspecified organism: Secondary | ICD-10-CM | POA: Diagnosis present

## 2019-10-19 DIAGNOSIS — E1122 Type 2 diabetes mellitus with diabetic chronic kidney disease: Secondary | ICD-10-CM | POA: Diagnosis present

## 2019-10-19 DIAGNOSIS — Z20822 Contact with and (suspected) exposure to covid-19: Secondary | ICD-10-CM | POA: Diagnosis present

## 2019-10-19 DIAGNOSIS — Z86718 Personal history of other venous thrombosis and embolism: Secondary | ICD-10-CM

## 2019-10-19 DIAGNOSIS — Z7902 Long term (current) use of antithrombotics/antiplatelets: Secondary | ICD-10-CM

## 2019-10-19 DIAGNOSIS — L0390 Cellulitis, unspecified: Secondary | ICD-10-CM | POA: Diagnosis not present

## 2019-10-19 DIAGNOSIS — L03115 Cellulitis of right lower limb: Principal | ICD-10-CM | POA: Diagnosis present

## 2019-10-19 DIAGNOSIS — I252 Old myocardial infarction: Secondary | ICD-10-CM

## 2019-10-19 DIAGNOSIS — E66813 Obesity, class 3: Secondary | ICD-10-CM | POA: Diagnosis present

## 2019-10-19 DIAGNOSIS — M79609 Pain in unspecified limb: Secondary | ICD-10-CM | POA: Diagnosis not present

## 2019-10-19 DIAGNOSIS — F17210 Nicotine dependence, cigarettes, uncomplicated: Secondary | ICD-10-CM | POA: Diagnosis present

## 2019-10-19 DIAGNOSIS — E1165 Type 2 diabetes mellitus with hyperglycemia: Secondary | ICD-10-CM | POA: Diagnosis present

## 2019-10-19 DIAGNOSIS — Z7982 Long term (current) use of aspirin: Secondary | ICD-10-CM

## 2019-10-19 DIAGNOSIS — R5381 Other malaise: Secondary | ICD-10-CM | POA: Diagnosis present

## 2019-10-19 DIAGNOSIS — Z8673 Personal history of transient ischemic attack (TIA), and cerebral infarction without residual deficits: Secondary | ICD-10-CM

## 2019-10-19 DIAGNOSIS — E871 Hypo-osmolality and hyponatremia: Secondary | ICD-10-CM | POA: Diagnosis not present

## 2019-10-19 DIAGNOSIS — N183 Chronic kidney disease, stage 3 unspecified: Secondary | ICD-10-CM | POA: Diagnosis present

## 2019-10-19 DIAGNOSIS — E785 Hyperlipidemia, unspecified: Secondary | ICD-10-CM | POA: Diagnosis present

## 2019-10-19 DIAGNOSIS — R609 Edema, unspecified: Secondary | ICD-10-CM

## 2019-10-19 DIAGNOSIS — R739 Hyperglycemia, unspecified: Secondary | ICD-10-CM

## 2019-10-19 DIAGNOSIS — R079 Chest pain, unspecified: Secondary | ICD-10-CM

## 2019-10-19 DIAGNOSIS — E875 Hyperkalemia: Secondary | ICD-10-CM | POA: Diagnosis not present

## 2019-10-19 DIAGNOSIS — M109 Gout, unspecified: Secondary | ICD-10-CM | POA: Diagnosis not present

## 2019-10-19 LAB — GLUCOSE, CAPILLARY: Glucose-Capillary: 107 mg/dL — ABNORMAL HIGH (ref 70–99)

## 2019-10-19 LAB — RESP PANEL BY RT PCR (RSV, FLU A&B, COVID)
Influenza A by PCR: NEGATIVE
Influenza B by PCR: NEGATIVE
Respiratory Syncytial Virus by PCR: NEGATIVE
SARS Coronavirus 2 by RT PCR: NEGATIVE

## 2019-10-19 LAB — COMPREHENSIVE METABOLIC PANEL
ALT: 17 U/L (ref 0–44)
AST: 27 U/L (ref 15–41)
Albumin: 2.9 g/dL — ABNORMAL LOW (ref 3.5–5.0)
Alkaline Phosphatase: 93 U/L (ref 38–126)
Anion gap: 10 (ref 5–15)
BUN: 35 mg/dL — ABNORMAL HIGH (ref 6–20)
CO2: 20 mmol/L — ABNORMAL LOW (ref 22–32)
Calcium: 8.9 mg/dL (ref 8.9–10.3)
Chloride: 104 mmol/L (ref 98–111)
Creatinine, Ser: 1.24 mg/dL (ref 0.61–1.24)
GFR, Estimated: >60 mL/min (ref >60)
Glucose, Bld: 77 mg/dL (ref 70–99)
Potassium: 5.4 mmol/L — ABNORMAL HIGH (ref 3.5–5.1)
Sodium: 134 mmol/L — ABNORMAL LOW (ref 135–145)
Total Bilirubin: 1.2 mg/dL (ref 0.3–1.2)
Total Protein: 6.6 g/dL (ref 6.5–8.1)

## 2019-10-19 LAB — CBC
HCT: 44 % (ref 39.0–52.0)
Hemoglobin: 14 g/dL (ref 13.0–17.0)
MCH: 30.5 pg (ref 26.0–34.0)
MCHC: 31.8 g/dL (ref 30.0–36.0)
MCV: 95.9 fL (ref 80.0–100.0)
Platelets: 267 10*3/uL (ref 150–400)
RBC: 4.59 MIL/uL (ref 4.22–5.81)
RDW: 15.2 % (ref 11.5–15.5)
WBC: 20.2 10*3/uL — ABNORMAL HIGH (ref 4.0–10.5)
nRBC: 0 % (ref 0.0–0.2)

## 2019-10-19 LAB — BASIC METABOLIC PANEL
Anion gap: 9 (ref 5–15)
BUN: 34 mg/dL — ABNORMAL HIGH (ref 6–20)
CO2: 22 mmol/L (ref 22–32)
Calcium: 9.1 mg/dL (ref 8.9–10.3)
Chloride: 106 mmol/L (ref 98–111)
Creatinine, Ser: 1.29 mg/dL — ABNORMAL HIGH (ref 0.61–1.24)
GFR, Estimated: >60 mL/min (ref >60)
Glucose, Bld: 146 mg/dL — ABNORMAL HIGH (ref 70–99)
Potassium: 5.8 mmol/L — ABNORMAL HIGH (ref 3.5–5.1)
Sodium: 137 mmol/L (ref 135–145)

## 2019-10-19 LAB — TROPONIN I (HIGH SENSITIVITY)
Troponin I (High Sensitivity): 11 ng/L (ref <18)
Troponin I (High Sensitivity): 12 ng/L (ref <18)

## 2019-10-19 MED ORDER — SODIUM CHLORIDE 0.9 % IV SOLN: INTRAVENOUS | Status: DC

## 2019-10-19 MED ORDER — INSULIN ASPART 100 UNIT/ML ~~LOC~~ SOLN: 0.0000 [IU] | Freq: Every day | SUBCUTANEOUS | Status: DC

## 2019-10-19 MED ORDER — POLYETHYLENE GLYCOL 3350 17 G PO PACK: 17.0000 g | PACK | Freq: Every day | ORAL | Status: DC | PRN

## 2019-10-19 MED ORDER — INSULIN ASPART 100 UNIT/ML ~~LOC~~ SOLN
0.0000 [IU] | Freq: Three times a day (TID) | SUBCUTANEOUS | Status: DC
  Administered 2019-10-20: 3 [IU] via SUBCUTANEOUS
  Administered 2019-10-21: 4 [IU] via SUBCUTANEOUS
  Administered 2019-10-21: 3 [IU] via SUBCUTANEOUS

## 2019-10-19 MED ORDER — METOPROLOL SUCCINATE ER 25 MG PO TB24
150.0000 mg | ORAL_TABLET | Freq: Every day | ORAL | Status: DC
  Administered 2019-10-20 – 2019-10-24 (×5): 150 mg via ORAL
  Filled 2019-10-19 (×5): qty 2

## 2019-10-19 MED ORDER — IOHEXOL 350 MG/ML SOLN
100.0000 mL | Freq: Once | INTRAVENOUS | Status: AC | PRN
  Administered 2019-10-19: 100 mL via INTRAVENOUS

## 2019-10-19 MED ORDER — ONDANSETRON HCL 4 MG/2ML IJ SOLN
4.0000 mg | Freq: Four times a day (QID) | INTRAMUSCULAR | Status: DC | PRN
  Administered 2019-10-21: 4 mg via INTRAVENOUS
  Filled 2019-10-19: qty 2

## 2019-10-19 MED ORDER — CLOPIDOGREL BISULFATE 75 MG PO TABS
75.0000 mg | ORAL_TABLET | Freq: Every day | ORAL | Status: DC
  Administered 2019-10-20 – 2019-10-24 (×5): 75 mg via ORAL
  Filled 2019-10-19 (×5): qty 1

## 2019-10-19 MED ORDER — HYDROCODONE-ACETAMINOPHEN 5-325 MG PO TABS
1.0000 | ORAL_TABLET | Freq: Two times a day (BID) | ORAL | Status: DC | PRN
  Administered 2019-10-19 – 2019-10-20 (×2): 1 via ORAL
  Filled 2019-10-19 (×3): qty 1

## 2019-10-19 MED ORDER — ASPIRIN EC 81 MG PO TBEC
81.0000 mg | DELAYED_RELEASE_TABLET | Freq: Every day | ORAL | Status: DC
  Administered 2019-10-20 – 2019-10-23 (×4): 81 mg via ORAL
  Filled 2019-10-19 (×4): qty 1

## 2019-10-19 MED ORDER — ALBUTEROL SULFATE (2.5 MG/3ML) 0.083% IN NEBU: 2.5000 mg | INHALATION_SOLUTION | RESPIRATORY_TRACT | Status: DC | PRN

## 2019-10-19 MED ORDER — VANCOMYCIN HCL IN DEXTROSE 1-5 GM/200ML-% IV SOLN
1000.0000 mg | Freq: Two times a day (BID) | INTRAVENOUS | Status: DC
  Administered 2019-10-20 – 2019-10-21 (×3): 1000 mg via INTRAVENOUS
  Filled 2019-10-19 (×5): qty 200

## 2019-10-19 MED ORDER — ACETAMINOPHEN 650 MG RE SUPP: 650.0000 mg | Freq: Four times a day (QID) | RECTAL | Status: DC | PRN

## 2019-10-19 MED ORDER — SODIUM CHLORIDE 0.9 % IV SOLN
2.0000 g | Freq: Once | INTRAVENOUS | Status: AC
  Administered 2019-10-19: 2 g via INTRAVENOUS
  Filled 2019-10-19: qty 20

## 2019-10-19 MED ORDER — ENOXAPARIN SODIUM 80 MG/0.8ML ~~LOC~~ SOLN
70.0000 mg | SUBCUTANEOUS | Status: DC
  Administered 2019-10-19 – 2019-10-23 (×5): 70 mg via SUBCUTANEOUS
  Filled 2019-10-19 (×2): qty 0.8
  Filled 2019-10-19: qty 0.7
  Filled 2019-10-19 (×2): qty 0.8

## 2019-10-19 MED ORDER — ATORVASTATIN CALCIUM 10 MG PO TABS
10.0000 mg | ORAL_TABLET | Freq: Every day | ORAL | Status: DC
  Administered 2019-10-20 – 2019-10-23 (×4): 10 mg via ORAL
  Filled 2019-10-19 (×4): qty 1

## 2019-10-19 MED ORDER — AMLODIPINE BESYLATE 10 MG PO TABS
10.0000 mg | ORAL_TABLET | Freq: Every day | ORAL | Status: DC
  Administered 2019-10-20 – 2019-10-23 (×4): 10 mg via ORAL
  Filled 2019-10-19 (×4): qty 1

## 2019-10-19 MED ORDER — ONDANSETRON HCL 4 MG PO TABS: 4.0000 mg | ORAL_TABLET | Freq: Four times a day (QID) | ORAL | Status: DC | PRN

## 2019-10-19 MED ORDER — PREGABALIN 100 MG PO CAPS
100.0000 mg | ORAL_CAPSULE | Freq: Three times a day (TID) | ORAL | Status: DC
  Administered 2019-10-19 – 2019-10-21 (×5): 100 mg via ORAL
  Filled 2019-10-19 (×5): qty 1

## 2019-10-19 MED ORDER — ACETAMINOPHEN 325 MG PO TABS
650.0000 mg | ORAL_TABLET | Freq: Four times a day (QID) | ORAL | Status: DC | PRN
  Administered 2019-10-20 – 2019-10-21 (×2): 650 mg via ORAL
  Filled 2019-10-19 (×2): qty 2

## 2019-10-19 MED ORDER — VANCOMYCIN HCL 10 G IV SOLR
2500.0000 mg | Freq: Once | INTRAVENOUS | Status: AC
  Administered 2019-10-19: 2500 mg via INTRAVENOUS
  Filled 2019-10-19: qty 2500

## 2019-10-19 MED ORDER — KETOROLAC TROMETHAMINE 15 MG/ML IJ SOLN
15.0000 mg | Freq: Four times a day (QID) | INTRAMUSCULAR | Status: DC | PRN
  Administered 2019-10-19 – 2019-10-20 (×3): 15 mg via INTRAVENOUS
  Filled 2019-10-19 (×3): qty 1

## 2019-10-19 MED ORDER — SODIUM CHLORIDE 0.9 % IV SOLN
2.0000 g | INTRAVENOUS | Status: DC
  Administered 2019-10-20 – 2019-10-21 (×2): 2 g via INTRAVENOUS
  Filled 2019-10-19 (×3): qty 20

## 2019-10-19 NOTE — ED Notes (Signed)
Per EDPA one set of cultures acceptable, cultures collected before antibiotic placement when IV was originally placed

## 2019-10-19 NOTE — H&P (Signed)
History and Physical    Jesus Cruz ZOX:[id number] DOB: [birth year] DOA: 10/19/2019  PCP: Charlotte Sanes, MD   Patient coming from: Home  I have personally briefly reviewed patient's old medical records in [place]  Chief Complaint: Right leg pain, redness and swelling  HPI: [name] is a 54 y.o. 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  Patient denies any history of trauma or animal/insect bite to the right leg.  He suddenly noticed that his right leg started becoming painful and swollen with redness which started in the lower leg and progressed upwards towards his knee and thigh.  He also started having fever with chills for the same duration.  States that his pain is like persistent tightness.  Denies cough, palpitations.  Intermittently also complained of shortness of breath and some chest pain.  He is currently not on any anticoagulation.  Denies any recent immobilization, trauma, history of cancer.  No diarrhea, dysuria, loss of consciousness, seizures, abdominal pain.  ED Course: He was found to have leukocytosis.  Lower extremity Doppler ultrasound was negative for DVT.  CTA chest was negative for PE.  Potassium was 5.8.  He was started on IV antibiotics. Hospitalist service was called to evaluate the patient.  Review of Systems: As per HPI otherwise all other systems were reviewed and are negative.   Past Medical History:  Diagnosis Date  . CVA (cerebral vascular accident) ([place])   . Gout   . Hypertension   . MGUS (monoclonal gammopathy of unknown significance)   . Obesity   . OSA on CPAP   . Pulmonary embolus ([place]) 05/2012   During recovery from MI, completed Coumadin  . Stage 3 chronic kidney disease   . STEMI (ST  elevation myocardial infarction) ([place]) 04/2012   Integrity BMS RCA with minor left system disease    Past Surgical History:  Procedure Laterality Date  . CORONARY ANGIOPLASTY WITH STENT PLACEMENT  04/2012   Integrity BMS RCA, minor L system dz  . LEFT HEART CATH AND CORONARY ANGIOGRAPHY N/A 03/31/2019   Procedure: LEFT HEART CATH AND CORONARY ANGIOGRAPHY;  Surgeon: Leonie Man, MD;  Location: [facility];  Service: Cardiovascular;  Laterality: N/A;  . RADIOLOGY WITH ANESTHESIA N/A 09/16/2013   Procedure: RADIOLOGY WITH ANESTHESIA;  Surgeon: Rob Hickman, MD;  Location: [facility];  Service: Radiology;  Laterality: N/A;     reports that he has been smoking cigarettes. He has never used smokeless tobacco. He reports that he does not drink alcohol and does not use drugs.  Allergies  Allergen Reactions  . Other Nausea And Vomiting    Mayonnaise      No family history on file.  Prior to Admission medications   Medication Sig Start Date End Date Taking? Authorizing Provider  allopurinol (ZYLOPRIM) 300 MG tablet Take 300 mg by mouth daily.    [provider]  amLODipine (NORVASC) 10 MG tablet Take 10 mg by mouth at bedtime.     [provider]  aspirin EC 81 MG tablet Take 81 mg by mouth at bedtime.    [provider]  atorvastatin (LIPITOR) 10 MG tablet Take 10 mg by mouth at bedtime.  03/09/19   [provider]  blood glucose meter kit and supplies Dispense based  on patient and insurance preference. Use up to four times daily as directed. (FOR ICD-10 E10.9, E11.9). 04/07/19   [name], DO  clopidogrel (PLAVIX) 75 MG tablet Take 1 tablet (75 mg total) by mouth daily. 04/08/19   Mikhail, [name], DO  colchicine 0.6 MG tablet Take 0.6 mg by mouth at bedtime as needed (gout).     [provider]  cyclobenzaprine (FLEXERIL) 10 MG tablet Take 1 tablet (10 mg total) by mouth 3 (three) times daily as needed for muscle spasms. 04/07/19    Mikhail, [name], DO  glimepiride (AMARYL) 4 MG tablet Take 1 tablet (4 mg total) by mouth daily with breakfast. 04/07/19   [name], DO  hydrALAZINE (APRESOLINE) 25 MG tablet Take 1 tablet (25 mg total) by mouth 3 (three) times daily. 05/04/19   Imogene Burn, PA-C  HYDROcodone-acetaminophen (NORCO/VICODIN) 5-325 MG tablet Take 1 tablet by mouth 2 (two) times daily as needed for pain. 03/16/19   [provider]  Insulin Pen Needle 31G X 5 MM MISC Use with Levemir flexpen 04/07/19   [name], DO  LANTUS SOLOSTAR 100 UNIT/ML Solostar Pen Inject 10 Units into the skin daily. 04/14/19   [provider]  metoCLOPramide (REGLAN) 5 MG tablet Take 1 tablet (5 mg total) by mouth 3 (three) times daily before meals. 04/07/19   Mikhail, [name], DO  metoprolol succinate (TOPROL-XL) 50 MG 24 hr tablet Take 3 tablets (150 mg total) by mouth daily. 04/08/19   Mikhail, [name], DO  morphine (MS CONTIN) 15 MG 12 hr tablet Take 15 mg by mouth 3 (three) times daily. 02/26/19   [provider]  naloxone [facility]) nasal spray 4 mg/0.1 mL Place 1 spray into the nose once.  11/26/17   [provider]  nitroGLYCERIN (NITROSTAT) 0.4 MG SL tablet Place 0.4 mg under the tongue every 5 (five) minutes as needed for chest pain.    [provider]  pantoprazole (PROTONIX) 40 MG tablet Take 1 tablet (40 mg total) by mouth 2 (two) times daily. 04/07/19 05/31/19  Mikhail, [name], DO  predniSONE (DELTASONE) 10 MG tablet Take 10 mg by mouth daily as needed. 12/24/18   [provider]  pregabalin (LYRICA) 100 MG capsule Take 100 mg by mouth 3 (three) times daily.     [provider]    Physical Exam: Vitals:   10/19/19 0958 10/19/19 1039 10/19/19 1211 10/19/19 1302  BP:  122/73 115/72 111/71  Pulse:  (!) 110 (!) 103 93  Resp:  (!) 22 16 (!) 21  Temp:   98.7 F (37.1 C)   TempSrc: Oral  Oral   SpO2:  95% 93% 97%  Weight:      Height:         Constitutional: NAD, calm, comfortable.  Morbidly obese male lying on bed.  Poor historian. Vitals:   10/19/19 0958 10/19/19 1039 10/19/19 1211 10/19/19 1302  BP:  122/73 115/72 111/71  Pulse:  (!) 110 (!) 103 93  Resp:  (!) 22 16 (!) 21  Temp:   98.7 F (37.1 C)   TempSrc: Oral  Oral   SpO2:  95% 93% 97%  Weight:      Height:       Eyes: PERRL, lids and conjunctivae normal ENMT: Mucous membranes are moist. Posterior pharynx clear of any exudate or lesions. Neck: normal, supple, no masses, no thyromegaly Respiratory: bilateral decreased breath sounds at bases, no wheezing; some scattered crackles.  Intermittently tachypneic. Normal respiratory effort. No  accessory muscle use.  Cardiovascular: S1 S2 positive, tachycardic.  Right lower extremity is more swollen than the left.  2+ pedal pulses.  Abdomen: Morbidly obese, no tenderness, no masses palpated. No hepatosplenomegaly. Bowel sounds positive.  Musculoskeletal: no clubbing / cyanosis. No joint deformity upper and lower extremities.  Skin: Right lower extremity is swollen, warm, erythematous, tender from around the ankle up to just below the knee.  Right knee is nontender.   Neurologic: CN 2-12 grossly intact. Moving extremities. No focal neurologic deficits.  Psychiatric: Flat affect. Alert and oriented x 3.     Labs on Admission: I have personally reviewed following labs and imaging studies  CBC: Recent Labs  Lab 10/19/19 1006  WBC 20.2*  HGB 14.0  HCT 44.0  MCV 95.9  PLT 235   Basic Metabolic Panel: Recent Labs  Lab 10/19/19 1006  NA 137  K 5.8*  CL 106  CO2 22  GLUCOSE 146*  BUN 34*  CREATININE 1.29*  CALCIUM 9.1   GFR: Estimated Creatinine Clearance: 87.6 mL/min (A) (by C-G formula based on SCr of 1.29 mg/dL (H)). Liver Function Tests: No results for input(s): AST, ALT, ALKPHOS, BILITOT, PROT, ALBUMIN in the last 168 hours. No results for input(s): LIPASE, AMYLASE in the last 168 hours. No results  for input(s): AMMONIA in the last 168 hours. Coagulation Profile: No results for input(s): INR, PROTIME in the last 168 hours. Cardiac Enzymes: No results for input(s): CKTOTAL, CKMB, CKMBINDEX, TROPONINI in the last 168 hours. BNP (last 3 results) No results for input(s): PROBNP in the last 8760 hours. HbA1C: No results for input(s): HGBA1C in the last 72 hours. CBG: No results for input(s): GLUCAP in the last 168 hours. Lipid Profile: No results for input(s): CHOL, HDL, LDLCALC, TRIG, CHOLHDL, LDLDIRECT in the last 72 hours. Thyroid Function Tests: No results for input(s): TSH, T4TOTAL, FREET4, T3FREE, THYROIDAB in the last 72 hours. Anemia Panel: No results for input(s): VITAMINB12, FOLATE, FERRITIN, TIBC, IRON, RETICCTPCT in the last 72 hours. Urine analysis:    Component Value Date/Time   COLORURINE YELLOW 03/31/2019 [place] 03/31/2019 0137   LABSPEC 1.011 03/31/2019 0137   PHURINE 5.0 03/31/2019 0137   GLUCOSEU 150 (A) 03/31/2019 0137   HGBUR SMALL (A) 03/31/2019 [place] NEGATIVE 03/31/2019 0137   KETONESUR NEGATIVE 03/31/2019 0137   PROTEINUR NEGATIVE 03/31/2019 0137   NITRITE NEGATIVE 03/31/2019 0137   LEUKOCYTESUR NEGATIVE 03/31/2019 0137    Radiological Exams on Admission: DG Chest 2 View  Result Date: 10/19/2019 CLINICAL DATA:  Chest pain EXAM: CHEST - 2 VIEW COMPARISON:  04/02/2019 FINDINGS: Stable cardiomediastinal contours. Low lung volumes. No focal airspace consolidation, pleural effusion, or pneumothorax. Unchanged positioning of thoracic spinal stimulator leads. IMPRESSION: Low lung volumes. No acute cardiopulmonary findings. Electronically Signed   By: Davina Poke D.O.   On: 10/19/2019 10:28   CT Angio Chest PE W/Cm &/Or Wo Cm  Result Date: 10/19/2019 CLINICAL DATA:  PE suspected, high pretest probability EXAM: CT ANGIOGRAPHY CHEST WITH CONTRAST TECHNIQUE: Multidetector CT imaging of the chest was performed using the  standard protocol during bolus administration of intravenous contrast. Multiplanar CT image reconstructions and MIPs were obtained to evaluate the vascular anatomy. CONTRAST:  128m OMNIPAQUE IOHEXOL 350 MG/ML SOLN COMPARISON:  Radiograph 10/19/2019, CT 08/21/2017 FINDINGS: Cardiovascular: Satisfactory opacification the pulmonary arteries to the segmental level. No pulmonary artery filling defects are identified. Central pulmonary arteries are top normal caliber. Cardiac size within normal limits. Extensive three-vessel  coronary artery calcifications. No pericardial effusion. Minimal atherosclerotic plaque within normal caliber thoracic aorta. Shared origin of the brachiocephalic and left common carotid arteries. Proximal great vessels are minimally calcified but otherwise unremarkable. Small pericaval lipoma (6/97). No other major venous abnormalities. Mediastinum/Nodes: Abundant mediastinal fat. No mediastinal fluid or gas. Normal thyroid gland and thoracic inlet. No acute abnormality of the esophagus. Scattered secretions seen within the trachea. No worrisome mediastinal, hilar or axillary adenopathy. Lungs/Pleura: Mosaic regions of attenuation particularly towards the lung bases could reflect small airways disease or air trapping versus atelectatic change accentuated by imaging during exhalation. Bandlike opacities present in the left lower lobe and lingula have an appearance most compatible with subsegmental atelectasis or scarring. No focal consolidative opacity. No convincing features of edema. No pneumothorax or visible effusion. Abundant subpleural fat. Mild airways thickening and scattered secretions. Upper Abdomen: No acute abnormalities present in the visualized portions of the upper abdomen. Small accessory splenule. Musculoskeletal: Straightening of the normal thoracic kyphosis. Multilevel degenerative changes are present in the imaged portions of the spine. Spinal stimulator device terminating T6-T8.  Scoliotic curvature of the thoracic spine is imaged is similar to comparison. No worrisome chest wall lesions. Review of the MIP images confirms the above findings. IMPRESSION: 1. No evidence of acute pulmonary embolism. 2. Mosaic regions of attenuation particularly towards the lung bases could reflect small airways disease or air trapping versus atelectatic change accentuated by imaging during exhalation with additional areas of dependent and subsegmental atelectasis and/or scarring. 3. Scattered secretions in the trachea and central airways, could correlate for features of acute or chronic bronchitis. 4. No other acute intrathoracic process. 5. Extensive three-vessel coronary artery calcifications. 6. Aortic Atherosclerosis (ICD10-I70.0). Electronically Signed   By: Lovena Le M.D.   On: 10/19/2019 16:29   VAS [place] LOWER EXTREMITY VENOUS (DVT) (ONLY [facility] & [facility] 7a-7p)  Result Date: 10/19/2019  Lower Venous DVTStudy Indications: Pain, Swelling, and Edema.  Risk Factors: Obesity. Performing Technologist: Griffin Basil RCT RDMS  Examination Guidelines: A complete evaluation includes B-mode imaging, spectral Doppler, color Doppler, and power Doppler as needed of all accessible portions of each vessel. Bilateral testing is considered an integral part of a complete examination. Limited examinations for reoccurring indications may be performed as noted. The reflux portion of the exam is performed with the patient in reverse Trendelenburg.  +---------+---------------+---------+-----------+----------+------------------+ RIGHT    CompressibilityPhasicitySpontaneityPropertiesThrombus Aging     +---------+---------------+---------+-----------+----------+------------------+ CFV      Full           Yes      Yes                                     +---------+---------------+---------+-----------+----------+------------------+ SFJ      Full                                                             +---------+---------------+---------+-----------+----------+------------------+ FV Prox  Full                                                            +---------+---------------+---------+-----------+----------+------------------+  FV Mid   Full                                                            +---------+---------------+---------+-----------+----------+------------------+ FV DistalFull                                                            +---------+---------------+---------+-----------+----------+------------------+ PFV      Full                                                            +---------+---------------+---------+-----------+----------+------------------+ POP      Full           Yes      Yes                                     +---------+---------------+---------+-----------+----------+------------------+ PTV                                                   Normal on color                                                          imaging            +---------+---------------+---------+-----------+----------+------------------+ PERO                                                  Normal on color                                                          imaging            +---------+---------------+---------+-----------+----------+------------------+     Summary: RIGHT: - There is no evidence of deep vein thrombosis in the lower extremity.  - No cystic structure found in the popliteal fossa.  LEFT: - No evidence of common femoral vein obstruction.  *See table(s) above for measurements and observations.    Preliminary    ED EKG reviewed by myself: Sinus tachycardia.  No ST elevations or depressions.  Assessment/Plan  Right lower extremity bacterial cellulitis -Right lower extremity is extremely erythematous, swollen, edematous and warm from ankle to almost below the knee.  Patient also has had fevers  for the last few  days -Continue Rocephin.  Add vancomycin. -Follow cultures. -Repeat a.m. labs including CRP. -Lower extremity duplex ultrasound was negative for DVT -CTA chest was negative for PE.  COVID-19 testing was negative  Hypertension -Monitor blood pressure.  Resume antihypertensives in a.m.  Hold hydralazine for now.  Diabetes mellitus type 2 -Continue CBGs with SSI.  Long-acting insulin on hold for now.  Carb modified diet  History of CAD/STEMI -Troponin 11 and 12I.  EKG without any acute ischemic changes. -Continue aspirin, statin Plavix and metoprolol.  Outpatient follow-up with cardiology.  Chronic any disease stage IIIa -Monitor creatinine.  Currently at baseline.  Hyperkalemia -Potassium 5.8 on presentation.  EKG did not show peaked T waves.  Repeat stat potassium level.  Leukocytosis -Probably from cellulitis.  Patient has had chronic leukocytosis in the past.  Also intermittently uses prednisone.  Recommend outpatient follow-up with hematology upon discharge  Morbid obesity -Outpatient follow-up  Hyperlipidemia -Continue statin   DVT prophylaxis: Lovenox Code Status: Full Family Communication: None at bedside Disposition Plan: Home in 2 to 3 days once clinically improved Consults called: None Admission status: Telemetry/inpatient  Severity of Illness: The appropriate patient status for this patient is INPATIENT. Inpatient status is judged to be reasonable and necessary in order to provide the required intensity of service to ensure the patient's safety. The patient's presenting symptoms, physical exam findings, and initial radiographic and laboratory data in the context of their chronic comorbidities is felt to place them at high risk for further clinical deterioration. Furthermore, it is not anticipated that the patient will be medically stable for discharge from the hospital within 2 midnights of admission. The following factors support the patient status of inpatient.    " The patient's presenting symptoms include fever/right lower extremity swelling. " The worrisome physical exam findings include right lower extremity swelling/tenderness/erythema. " The initial radiographic and laboratory data are worrisome because of leukocytosis. " The chronic co-morbidities include CAD/hypertension/diabetes mellitus type 2.   * I certify that at the point of admission it is my clinical judgment that the patient will require inpatient hospital care spanning beyond 2 midnights from the point of admission due to high intensity of service, high risk for further deterioration and high frequency of surveillance required.Aline August MD [facility]  10/19/2019, 5:48 PM

## 2019-10-19 NOTE — ED Notes (Signed)
Started having pain in right groin Sunday night- then entire leg started swelling. Has hx of dVTs and PE's, as well as cellulitis.  Had a temp last night. 101 per pt.

## 2019-10-19 NOTE — ED Provider Notes (Signed)
Accepted handoff at shift change from [employer] Please see prior provider note for more detail.   Briefly: Patient is 54 y.o.   DDX: concern for cellulitis/DVT, also PE vs ACS  Plan: Follow-up on second troponin, CT PE study, DVT study    Physical Exam  BP 111/71 (BP Location: Right Arm)   Pulse 93   Temp 98.7 F (37.1 C) (Oral)   Resp (!) 21   Ht 5\' 7"  (1.702 m)   Wt (!) 137.4 kg   SpO2 97%   BMI 47.46 kg/m   Physical Exam Vitals and nursing note reviewed.  Constitutional:      General: He is not in acute distress.    Appearance: Normal appearance. He is not ill-appearing.  HENT:     Head: Normocephalic and atraumatic.  Eyes:     General: No scleral icterus.       Right eye: No discharge.        Left eye: No discharge.     Conjunctiva/sclera: Conjunctivae normal.  Pulmonary:     Effort: Pulmonary effort is normal.     Breath sounds: No stridor.  Musculoskeletal:     Comments: Right lower extremity with significant swelling and redness to the proximal tibia and what appears to be some possible lymphangitis spreading proximally up the inner thigh.  Skin is not warm to touch.  Feels approximately same temperature as left leg.  Neurological:     Mental Status: He is alert and oriented to person, place, and time. Mental status is at baseline.         ED Course/Procedures     Procedures  MDM   Patient has significant right lower extremity swelling and redness with negative DVT study.  Negative PE study.  His chest pain is very atypical.  I do not necessarily believe that patient needs admission for his chest pain however his right lower extremity is significantly swollen and red.  I suspect cellulitis given his tachycardia, tachypnea and intermittent fevers --he states T-max of 101.   5:27 PM discussed with [facility].  Patient will be admitted for cellulitis.  Blood culture orders placed, Rocephin 2 g ordered as patient is over 100 kg weight.   Patient understanding of need for admission.  Covid swab obtained. We will also obtain CMP for tried hospitalist to follow-up on.       Pati Gallo [place], [place] 10/19/19 1730    Fredia Sorrow, MD 10/21/19 [phone]

## 2019-10-19 NOTE — Progress Notes (Signed)
Pharmacy Antibiotic Note  Jesus Cruz is a 54 y.o. male admitted on 10/19/2019 with cellulitis.  Pharmacy has been consulted for vancomycin dosing. Patient is also on ceftriaxone for empiric coverage. This is day 1 of therapy. Pt is afebrile with WBC of 20.2.   Plan: vancomcyin 2500mg  IV x1 Vancomycin 1000mg  IV every 12 hours.  Goal trough 15-20 mcg/mL. Continue ceftriaxone therapy per primary team Monitor renal function, CBC, clinical progress, and vanc trough at steady state  Height: 5\' 7"  (170.2 cm) Weight: (!) 137.4 kg (303 lb) IBW/kg (Calculated) : 66.1  Temp (24hrs), Avg:98.6 F (37 C), Min:98.5 F (36.9 C), Max:98.7 F (37.1 C)  Recent Labs  Lab 10/19/19 1006  WBC 20.2*  CREATININE 1.29*    Estimated Creatinine Clearance: 87.6 mL/min (A) (by C-G formula based on SCr of 1.29 mg/dL (H)).    Allergies  Allergen Reactions  . Other Nausea And Vomiting    Mayonnaise      Antimicrobials this admission: Ceftriaxone 10/12> vanc 10/12>  Microbiology results: 10/12 resp panel: negative 10/12 Bcx: ordered   Thank you for allowing pharmacy to be a part of this patient's care.  Carolin Guernsey  PGY1 pharmacy resident 10/19/2019 5:51 PM

## 2019-10-19 NOTE — ED Triage Notes (Signed)
Pt bib ems from home with reports of edema to R leg with R sided groin pain. Hx blood clots. Redness noted to RLE. Pt now endorsing CP onset last night. Lsided no radiation. 97% 120 ST 140/80

## 2019-10-19 NOTE — Progress Notes (Signed)
Right Lower Ext. study completed.   See CVProc for preliminary results.   Sherrell Farish, RDMS, RVT 

## 2019-10-19 NOTE — ED Provider Notes (Signed)
[facility] EMERGENCY DEPARTMENT Provider Note   CSN: [id number] Arrival date & time: 10/19/19  0957     History Chief Complaint  Patient presents with   Leg Pain   Chest Pain    Jesus Cruz is a 54 y.o. male past medical history of CAD, CVA, PE, DVT, presenting to the emergency department with complaint of right leg pain and swelling, as well as chest pain and shortness of breath.  Patient states on Sunday his right leg became painful and swollen.  He has pain located in the groin as well as the right calf and foot.  He chronically has decreased sensation to the right foot though it is at baseline for him.  His pain feels similar to history of DVT.  He states yesterday evening he began feeling short of breath with left-sided pleuritic chest pain.  He also reports pain is a persistent tightness sensation.  No recent cough or fevers.  His chest pain feels similar to prior PE.  He had PE many years ago during "recovery from MI."  He is not on anticoagulation currently.  Does not describe recent immobilization, trauma, history of cancer.  His chest pain does not feel similar to prior MI.   The history is provided by the patient and medical records.       Past Medical History:  Diagnosis Date   CVA (cerebral vascular accident) ([place])    Gout    Hypertension    MGUS (monoclonal gammopathy of unknown significance)    Obesity    OSA on CPAP    Pulmonary embolus ([place]) 05/2012   During recovery from MI, completed Coumadin   Stage 3 chronic kidney disease    STEMI (ST elevation myocardial infarction) ([place]) 04/2012   Integrity BMS RCA with minor left system disease    Patient Active Problem List   Diagnosis Date Noted   Pressure injury of skin 04/01/2019   Acute pulmonary edema (HCC)    STEMI involving oth coronary artery of inferior wall (HCC)    Hyperkalemia 03/30/2019   Hyperglycemia 03/30/2019   Chest pain with moderate risk for cardiac  etiology 03/30/2019   Acute renal failure with acute tubular necrosis superimposed on stage 3a chronic kidney disease ([place])    Stroke with cerebral ischemia ([place]) 09/16/2013    Past Surgical History:  Procedure Laterality Date   CORONARY ANGIOPLASTY WITH STENT PLACEMENT  04/2012   Integrity BMS RCA, minor L system dz   LEFT HEART CATH AND CORONARY ANGIOGRAPHY N/A 03/31/2019   Procedure: LEFT HEART CATH AND CORONARY ANGIOGRAPHY;  Surgeon: Leonie Man, MD;  Location: [facility];  Service: Cardiovascular;  Laterality: N/A;   RADIOLOGY WITH ANESTHESIA N/A 09/16/2013   Procedure: RADIOLOGY WITH ANESTHESIA;  Surgeon: Rob Hickman, MD;  Location: [facility];  Service: Radiology;  Laterality: N/A;       No family history on file.  Social History   Tobacco Use   Smoking status: Current Some Day Smoker    Types: Cigarettes   Smokeless tobacco: Never Used  Vaping Use   Vaping Use: Never used  Substance Use Topics   Alcohol use: No   Drug use: Never    Home Medications Prior to Admission medications   Medication Sig Start Date End Date Taking? Authorizing Provider  allopurinol (ZYLOPRIM) 300 MG tablet Take 300 mg by mouth daily.    [provider]  amLODipine (NORVASC) 10 MG tablet Take 10 mg by mouth  at bedtime.     [provider]  aspirin EC 81 MG tablet Take 81 mg by mouth at bedtime.    [provider]  atorvastatin (LIPITOR) 10 MG tablet Take 10 mg by mouth at bedtime.  03/09/19   [provider]  blood glucose meter kit and supplies Dispense based on patient and insurance preference. Use up to four times daily as directed. (FOR ICD-10 E10.9, E11.9). 04/07/19   [name], DO  clopidogrel (PLAVIX) 75 MG tablet Take 1 tablet (75 mg total) by mouth daily. 04/08/19   Mikhail, [name], DO  colchicine 0.6 MG tablet Take 0.6 mg by mouth at bedtime as needed (gout).     [provider]  cyclobenzaprine (FLEXERIL) 10 MG  tablet Take 1 tablet (10 mg total) by mouth 3 (three) times daily as needed for muscle spasms. 04/07/19   Mikhail, [name], DO  glimepiride (AMARYL) 4 MG tablet Take 1 tablet (4 mg total) by mouth daily with breakfast. 04/07/19   [name], DO  hydrALAZINE (APRESOLINE) 25 MG tablet Take 1 tablet (25 mg total) by mouth 3 (three) times daily. 05/04/19   Imogene Burn, PA-C  HYDROcodone-acetaminophen (NORCO/VICODIN) 5-325 MG tablet Take 1 tablet by mouth 2 (two) times daily as needed for pain. 03/16/19   [provider]  Insulin Pen Needle 31G X 5 MM MISC Use with Levemir flexpen 04/07/19   [name], DO  LANTUS SOLOSTAR 100 UNIT/ML Solostar Pen Inject 10 Units into the skin daily. 04/14/19   [provider]  metoCLOPramide (REGLAN) 5 MG tablet Take 1 tablet (5 mg total) by mouth 3 (three) times daily before meals. 04/07/19   Mikhail, [name], DO  metoprolol succinate (TOPROL-XL) 50 MG 24 hr tablet Take 3 tablets (150 mg total) by mouth daily. 04/08/19   Mikhail, [name], DO  morphine (MS CONTIN) 15 MG 12 hr tablet Take 15 mg by mouth 3 (three) times daily. 02/26/19   [provider]  naloxone [facility]) nasal spray 4 mg/0.1 mL Place 1 spray into the nose once.  11/26/17   [provider]  nitroGLYCERIN (NITROSTAT) 0.4 MG SL tablet Place 0.4 mg under the tongue every 5 (five) minutes as needed for chest pain.    [provider]  pantoprazole (PROTONIX) 40 MG tablet Take 1 tablet (40 mg total) by mouth 2 (two) times daily. 04/07/19 05/31/19  Mikhail, [name], DO  predniSONE (DELTASONE) 10 MG tablet Take 10 mg by mouth daily as needed. 12/24/18   [provider]  pregabalin (LYRICA) 100 MG capsule Take 100 mg by mouth 3 (three) times daily.     [provider]    Allergies    Other  Review of Systems   Review of Systems  Respiratory: Positive for shortness of breath.   Cardiovascular: Positive for chest pain and leg swelling.  Skin:  Positive for color change.  All other systems reviewed and are negative.   Physical Exam Updated Vital Signs BP 111/71 (BP Location: Right Arm)    Pulse 93    Temp 98.7 F (37.1 C) (Oral)    Resp (!) 21    Ht _0  (1.702 m)    Wt (!) 137.4 kg    SpO2 97%    BMI 47.46 kg/m   Physical Exam Vitals and nursing note reviewed.  Constitutional:      Appearance: He is well-developed. He is obese.  HENT:     Head: Normocephalic and atraumatic.  Eyes:  Conjunctiva/sclera: Conjunctivae normal.  Cardiovascular:     Rate and Rhythm: Regular rhythm. Tachycardia present.  Pulmonary:     Effort: Pulmonary effort is normal. Tachypnea present.     Breath sounds: Normal breath sounds.  Abdominal:     General: Bowel sounds are normal.     Palpations: Abdomen is soft.     Tenderness: There is no abdominal tenderness.  Musculoskeletal:     Comments: Right lower extremity is significantly edematous.  There is marked erythema to the lower leg, circumferentially with nonblanching petechia.  There is also some erythema appreciated to the medial thigh into the groin.  There is a dopplerable DP pulse present.  He has some tenderness to the calf, popliteal region and medial thigh/groin.  No wounds or drainage appreciated.  Leg is not hot.  Skin:    General: Skin is warm.  Neurological:     Mental Status: He is alert.  Psychiatric:        Behavior: Behavior normal.     ED Results / Procedures / Treatments   Labs (all labs ordered are listed, but only abnormal results are displayed) Labs Reviewed  BASIC METABOLIC PANEL - Abnormal; Notable for the following components:      Result Value   Potassium 5.8 (*)    Glucose, Bld 146 (*)    BUN 34 (*)    Creatinine, Ser 1.29 (*)    All other components within normal limits  CBC - Abnormal; Notable for the following components:   WBC 20.2 (*)    All other components within normal limits  RESP PANEL BY RT PCR (RSV, FLU A&B, COVID)  TROPONIN I (HIGH  SENSITIVITY)  TROPONIN I (HIGH SENSITIVITY)    EKG None  Radiology DG Chest 2 View  Result Date: 10/19/2019 CLINICAL DATA:  Chest pain EXAM: CHEST - 2 VIEW COMPARISON:  04/02/2019 FINDINGS: Stable cardiomediastinal contours. Low lung volumes. No focal airspace consolidation, pleural effusion, or pneumothorax. Unchanged positioning of thoracic spinal stimulator leads. IMPRESSION: Low lung volumes. No acute cardiopulmonary findings. Electronically Signed   By: Davina Poke D.O.   On: 10/19/2019 10:28    Procedures Procedures (including critical care time)  Medications Ordered in ED Medications - No data to display  ED Course  I have reviewed the triage vital signs and the nursing notes.  Pertinent labs & imaging results that were available during my care of the patient were reviewed by me and considered in my medical decision making (see chart for details).    MDM Rules/Calculators/A&P                          Patient presenting to the ED with right leg swelling, redness and pain since Sunday, as well as left-sided pleuritic chest pain and tightness that began last night with shortness of breath.  History of PE, not currently anticoagulated.  His symptoms feel similar to PE, do not feel similar to prior MI.  On exam, he is tachypneic with normal respiratory effort.  He has marked erythema and edema to the right lower extremity, no warmth or drainage. Dopplerable pulse is present. Cardiac workup initiated in triage is unremarkable. RLE venous US and CTA of the chest is pending at this time. Care assumed at shift change by PA Wylder to follow results. If negative, consider admission for chest rule out given significant cardiac hx. Consider cellulitis of RLE given marked erythema and swelling, though no warmth and no systemic  symptoms are noted.   Final Clinical Impression(s) / ED Diagnoses Final diagnoses:  None    Rx / DC Orders ED Discharge Orders    None       Lauraine Crespo,  [place] N, PA-C 10/19/19 1537    Sherwood Gambler, MD 10/22/19 1500

## 2019-10-19 NOTE — ED Notes (Signed)
Dinner Tray Ordered @ [phone].

## 2019-10-20 ENCOUNTER — Encounter (HOSPITAL_COMMUNITY): Payer: Self-pay | Admitting: Internal Medicine

## 2019-10-20 ENCOUNTER — Other Ambulatory Visit: Payer: Self-pay

## 2019-10-20 DIAGNOSIS — N1831 Chronic kidney disease, stage 3a: Secondary | ICD-10-CM | POA: Diagnosis not present

## 2019-10-20 DIAGNOSIS — Z794 Long term (current) use of insulin: Secondary | ICD-10-CM

## 2019-10-20 DIAGNOSIS — A419 Sepsis, unspecified organism: Secondary | ICD-10-CM | POA: Diagnosis not present

## 2019-10-20 DIAGNOSIS — E1165 Type 2 diabetes mellitus with hyperglycemia: Secondary | ICD-10-CM

## 2019-10-20 DIAGNOSIS — I10 Essential (primary) hypertension: Secondary | ICD-10-CM

## 2019-10-20 DIAGNOSIS — G894 Chronic pain syndrome: Secondary | ICD-10-CM

## 2019-10-20 DIAGNOSIS — I2510 Atherosclerotic heart disease of native coronary artery without angina pectoris: Secondary | ICD-10-CM | POA: Diagnosis not present

## 2019-10-20 DIAGNOSIS — L03115 Cellulitis of right lower limb: Secondary | ICD-10-CM | POA: Diagnosis not present

## 2019-10-20 LAB — COMPREHENSIVE METABOLIC PANEL
ALT: 16 U/L (ref 0–44)
AST: 14 U/L — ABNORMAL LOW (ref 15–41)
Albumin: 2.6 g/dL — ABNORMAL LOW (ref 3.5–5.0)
Alkaline Phosphatase: 73 U/L (ref 38–126)
Anion gap: 9 (ref 5–15)
BUN: 37 mg/dL — ABNORMAL HIGH (ref 6–20)
CO2: 20 mmol/L — ABNORMAL LOW (ref 22–32)
Calcium: 8.7 mg/dL — ABNORMAL LOW (ref 8.9–10.3)
Chloride: 105 mmol/L (ref 98–111)
Creatinine, Ser: 1.36 mg/dL — ABNORMAL HIGH (ref 0.61–1.24)
GFR, Estimated: 59 mL/min — ABNORMAL LOW (ref >60)
Glucose, Bld: 76 mg/dL (ref 70–99)
Potassium: 4.9 mmol/L (ref 3.5–5.1)
Sodium: 134 mmol/L — ABNORMAL LOW (ref 135–145)
Total Bilirubin: 0.8 mg/dL (ref 0.3–1.2)
Total Protein: 6.1 g/dL — ABNORMAL LOW (ref 6.5–8.1)

## 2019-10-20 LAB — CBC
HCT: 39.8 % (ref 39.0–52.0)
Hemoglobin: 13 g/dL (ref 13.0–17.0)
MCH: 31 pg (ref 26.0–34.0)
MCHC: 32.7 g/dL (ref 30.0–36.0)
MCV: 94.8 fL (ref 80.0–100.0)
Platelets: 220 10*3/uL (ref 150–400)
RBC: 4.2 MIL/uL — ABNORMAL LOW (ref 4.22–5.81)
RDW: 15.3 % (ref 11.5–15.5)
WBC: 17.4 10*3/uL — ABNORMAL HIGH (ref 4.0–10.5)
nRBC: 0 % (ref 0.0–0.2)

## 2019-10-20 LAB — GLUCOSE, CAPILLARY
Glucose-Capillary: 79 mg/dL (ref 70–99)
Glucose-Capillary: 137 mg/dL — ABNORMAL HIGH (ref 70–99)
Glucose-Capillary: 109 mg/dL — ABNORMAL HIGH (ref 70–99)
Glucose-Capillary: 127 mg/dL — ABNORMAL HIGH (ref 70–99)
Glucose-Capillary: 131 mg/dL — ABNORMAL HIGH (ref 70–99)

## 2019-10-20 LAB — HEMOGLOBIN A1C
Hgb A1c MFr Bld: 5.9 % — ABNORMAL HIGH (ref 4.8–5.6)
Mean Plasma Glucose: 122.63 mg/dL

## 2019-10-20 LAB — MRSA PCR SCREENING: MRSA by PCR: NEGATIVE

## 2019-10-20 LAB — C-REACTIVE PROTEIN: CRP: 18.7 mg/dL — ABNORMAL HIGH (ref <1.0)

## 2019-10-20 MED ORDER — MORPHINE SULFATE ER 15 MG PO TBCR
15.0000 mg | EXTENDED_RELEASE_TABLET | Freq: Two times a day (BID) | ORAL | Status: DC
  Administered 2019-10-20 – 2019-10-24 (×8): 15 mg via ORAL
  Filled 2019-10-20 (×8): qty 1

## 2019-10-20 MED ORDER — ALLOPURINOL 300 MG PO TABS
400.0000 mg | ORAL_TABLET | Freq: Two times a day (BID) | ORAL | Status: DC
  Administered 2019-10-20 – 2019-10-24 (×8): 400 mg via ORAL
  Filled 2019-10-20 (×8): qty 1

## 2019-10-20 MED ORDER — DOXAZOSIN MESYLATE 2 MG PO TABS
2.0000 mg | ORAL_TABLET | Freq: Every day | ORAL | Status: DC
  Administered 2019-10-20 – 2019-10-23 (×4): 2 mg via ORAL
  Filled 2019-10-20 (×5): qty 1

## 2019-10-20 NOTE — Progress Notes (Signed)
New admit from ED, to [phone]. Patient alert and oriented x4, vss. On room air, no distress observed. Oriented to room, and call light use. Voiced understanding.

## 2019-10-20 NOTE — Progress Notes (Signed)
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  In ED, HR 118.  RR 24.  WBC 20.2.  K5.8. Cr 1.29 (baseline).  BUN 34.  Glucose 146. Trop 11> 12.  CXR without acute finding.  CTA chest negative for PE or significant finding but cxtensive three-vessel coronary artery calcifications.  COVID-19 and influenza PCR negative.  Cultures obtained.  Started on broad-spectrum antibiotics and admitted for RLE cellulitis.  Subjective: Seen and examined earlier this morning.  No major events overnight of this morning.  Reports improvement in pain.  Still with significant RLE erythema and swelling.  Denies chest pain, dyspnea, GI or UTI symptoms.  Objective: Vitals:   10/19/19 2200 10/19/19 2330 10/20/19 0510 10/20/19 1517  BP: (!) 145/65  139/77 (!) 154/87  Pulse: 93 90 87 86  Resp: '19 18 19 20  ' Temp: 98.1 F (36.7 C)  97.8 F (36.6 C) 99 F (37.2 C)  TempSrc: Oral  Oral Oral  SpO2: 100% 98% 95% 100%  Weight: (!) 145.5 kg     Height: '5\' 7"'  (1.702 m)       Intake/Output Summary (Last 24 hours) at 10/20/2019 1636 Last data filed at 10/20/2019 1152 Gross per 24 hour  Intake 898.12 ml  Output 1200 ml  Net -301.88 ml   Filed Weights   10/19/19 0957 10/19/19 2200  Weight: (!) 137.4 kg (!) 145.5 kg    Examination:  GENERAL: No apparent distress.  Nontoxic. HEENT: MMM.  Vision and hearing grossly intact.  NECK: Supple.  No apparent JVD.  RESP: On room air.  No IWOB.  Fair aeration bilaterally. CVS:  RRR. Heart sounds normal.  ABD/GI/GU: BS+. Abd soft, NTND.  MSK/EXT:  Moves extremities. No apparent deformity.  Trace  edema bilaterally, Rt>Lt SKIN: Significant circumferential RLE erythema with some increased warmth to touch. NEURO: Awake, alert and oriented appropriately.  No apparent focal neuro deficit. PSYCH: Calm. Normal affect.  Procedures:  None  Microbiology summarized: COVID-19 PCR negative. Influenza PCR negative. Blood cultures pending MRSA PCR negative.  Assessment & Plan: Sepsis due to right lower extremity cellulitis: POA.  Met sepsis criteria with tachycardia, tachypnea, leukocytosis and RLE cellulitis on admission.  CRP elevated.  Bilateral lower extremity Doppler negative for DVT.  CTA chest without significant finding. -Continue vancomycin and Rocephin pending blood cultures -Follow blood cultures. -Trend leukocytosis.  Controlled DM-2 with hyperglycemia: A1c 5.9%. Recent Labs  Lab 10/19/19 2207 10/20/19 0813 10/20/19 1151  GLUCAP 107* 79 137*  -Continue current insulin regimen -Continue statin.  History of CAD/STEMI-no chest pain.  High-sensitivity troponin and EKG without significant finding.  CTA chest revealed significant three-vessel calcification -Continue aspirin, statin, Plavix and metoprolol -Outpatient follow-up with cardiology  Essential hypertension: BP within fair range. -Continue home amlodipine and metoprolol.  CKD-3A: Stable. -Continue monitoring -Discontinue Toradol.  Hyperkalemia: K5.8> 4.9.  Resolved. -Continue monitoring  Leukocytosis: Likely due to #1.  Improving. -Continue monitoring  Hyperlipidemia -Continue statin.  Debility/physical deconditioning -PT/OT consult  Chronic pain: -Continue home medications-MS Contin, Lyrica as needed Norco  Morbid obesity Body mass index is 50.24 kg/m.  -Encourage lifestyle change to lose weight.  DVT prophylaxis:  Subcu Lovenox  Code Status: Full code Family Communication: Patient and/or RN. Available if any question.  Status is: Inpatient  Remains inpatient appropriate  because:IV treatments appropriate due to intensity of illness or inability to take PO and Inpatient level of care appropriate due to severity of illness   Dispo: The patient is from: Home              Anticipated d/c is to: Home              Anticipated d/c date is: 2 days              Patient currently is not medically stable to d/c.       Consultants:  None   Sch Meds:  Scheduled Meds: . allopurinol  400 mg Oral BID  . amLODipine  10 mg Oral QHS  . aspirin EC  81 mg Oral QHS  . atorvastatin  10 mg Oral QHS  . clopidogrel  75 mg Oral Daily  . doxazosin  2 mg Oral QHS  . enoxaparin (LOVENOX) injection  70 mg Subcutaneous Q24H  . insulin aspart  0-20 Units Subcutaneous TID WC  . insulin aspart  0-5 Units Subcutaneous QHS  . metoprolol succinate  150 mg Oral Daily  . pregabalin  100 mg Oral TID   Continuous Infusions: . sodium chloride Stopped (10/20/19 0607)  . cefTRIAXone (ROCEPHIN)  IV 2 g (10/20/19 1633)  . vancomycin 1,000 mg (10/20/19 0607)   PRN Meds:.acetaminophen **OR** acetaminophen, albuterol, HYDROcodone-acetaminophen, ondansetron **OR** ondansetron (ZOFRAN) IV, polyethylene glycol  Antimicrobials: Anti-infectives (From admission, onward)   Start     Dose/Rate Route Frequency Ordered Stop   10/20/19 0600  vancomycin (VANCOCIN) IVPB 1000 mg/200 mL premix        1,000 mg 200 mL/hr over 60 Minutes Intravenous Every 12 hours 10/19/19 1750     10/19/19 1800  cefTRIAXone (ROCEPHIN) 2 g in sodium chloride 0.9 % 100 mL IVPB        2 g 200 mL/hr over 30 Minutes Intravenous Every 24 hours 10/19/19 1748     10/19/19 1800  vancomycin (VANCOCIN) 2,500 mg in sodium chloride 0.9 % 500 mL IVPB        2,500 mg 250 mL/hr over 120 Minutes Intravenous  Once 10/19/19 1750 10/19/19 2056   10/19/19 1700  cefTRIAXone (ROCEPHIN) 2 g in sodium chloride 0.9 % 100 mL IVPB        2 g 200 mL/hr over 30 Minutes Intravenous  Once 10/19/19 1654 10/19/19 1750       I have personally  reviewed the following labs and images: CBC: Recent Labs  Lab 10/19/19 1006 10/20/19 0029  WBC 20.2* 17.4*  HGB 14.0 13.0  HCT 44.0 39.8  MCV 95.9 94.8  PLT 267 220   BMP &GFR Recent Labs  Lab 10/19/19 1006 10/19/19 1745 10/20/19 0029  NA 137 134* 134*  K 5.8* 5.4* 4.9  CL 106 104 105  CO2 22 20* 20*  GLUCOSE 146* 77 76  BUN 34* 35* 37*  CREATININE 1.29* 1.24 1.36*  CALCIUM 9.1 8.9 8.7*   Estimated Creatinine Clearance: 86 mL/min (A) (by C-G formula based on SCr of 1.36 mg/dL (H)). Liver & Pancreas: Recent Labs  Lab 10/19/19 1745 10/20/19 0029  AST 27 14*  ALT 17 16  ALKPHOS 93 73  BILITOT 1.2 0.8  PROT 6.6 6.1*  ALBUMIN 2.9* 2.6*   No results for input(s): LIPASE, AMYLASE in the last  168 hours. No results for input(s): AMMONIA in the last 168 hours. Diabetic: Recent Labs    10/20/19 0029  HGBA1C 5.9*   Recent Labs  Lab 10/19/19 2207 10/20/19 0813 10/20/19 1151  GLUCAP 107* 79 137*   Cardiac Enzymes: No results for input(s): CKTOTAL, CKMB, CKMBINDEX, TROPONINI in the last 168 hours. No results for input(s): PROBNP in the last 8760 hours. Coagulation Profile: No results for input(s): INR, PROTIME in the last 168 hours. Thyroid Function Tests: No results for input(s): TSH, T4TOTAL, FREET4, T3FREE, THYROIDAB in the last 72 hours. Lipid Profile: No results for input(s): CHOL, HDL, LDLCALC, TRIG, CHOLHDL, LDLDIRECT in the last 72 hours. Anemia Panel: No results for input(s): VITAMINB12, FOLATE, FERRITIN, TIBC, IRON, RETICCTPCT in the last 72 hours. Urine analysis:    Component Value Date/Time   COLORURINE YELLOW 03/31/2019 [place] 03/31/2019 0137   LABSPEC 1.011 03/31/2019 0137   PHURINE 5.0 03/31/2019 0137   GLUCOSEU 150 (A) 03/31/2019 0137   HGBUR SMALL (A) 03/31/2019 0137   BILIRUBINUR NEGATIVE 03/31/2019 0137   KETONESUR NEGATIVE 03/31/2019 0137   PROTEINUR NEGATIVE 03/31/2019 0137   NITRITE NEGATIVE 03/31/2019 0137    LEUKOCYTESUR NEGATIVE 03/31/2019 0137   Sepsis Labs: Invalid input(s): PROCALCITONIN, [place]  Microbiology: Recent Results (from the past 240 hour(s))  Blood culture (routine x 2)     Status: None (Preliminary result)   Collection Time: 10/19/19  2:30 PM   Specimen: BLOOD  Result Value Ref Range Status   Specimen Description BLOOD LEFT ANTECUBITAL  Final   Special Requests   Final    BOTTLES DRAWN AEROBIC AND ANAEROBIC Blood Culture adequate volume   Culture   Final    NO GROWTH < 12 HOURS Performed at [facility], [address], [place], [place] [address]    Report Status PENDING  Incomplete  Resp Panel by RT PCR (RSV, Flu A&B, Covid) - Nasopharyngeal Swab     Status: None   Collection Time: 10/19/19  4:11 PM   Specimen: Nasopharyngeal Swab  Result Value Ref Range Status   SARS Coronavirus 2 by RT PCR NEGATIVE NEGATIVE Final    Comment: (NOTE) SARS-CoV-2 target nucleic acids are NOT DETECTED.  The SARS-CoV-2 RNA is generally detectable in upper respiratoy specimens during the acute phase of infection. The lowest concentration of SARS-CoV-2 viral copies this assay can detect is 131 copies/mL. A negative result does not preclude SARS-Cov-2 infection and should not be used as the sole basis for treatment or other patient management decisions. A negative result may occur with  improper specimen collection/handling, submission of specimen other than nasopharyngeal swab, presence of viral mutation(s) within the areas targeted by this assay, and inadequate number of viral copies (<131 copies/mL). A negative result must be combined with clinical observations, patient history, and epidemiological information. The expected result is Negative.  Fact Sheet for Patients:  PinkCheek.be  Fact Sheet for Healthcare Providers:  GravelBags.it  This test is no t yet approved or cleared by the [place] FDA and  has  been authorized for detection and/or diagnosis of SARS-CoV-2 by FDA under an Emergency Use Authorization (EUA). This EUA will remain  in effect (meaning this test can be used) for the duration of the COVID-19 declaration under Section 564(b)(1) of the Act, 21 U.S.C. section 360bbb-3(b)(1), unless the authorization is terminated or revoked sooner.     Influenza A by PCR NEGATIVE NEGATIVE Final   Influenza B by PCR NEGATIVE NEGATIVE Final  Comment: (NOTE) The Xpert Xpress SARS-CoV-2/FLU/RSV assay is intended as an aid in  the diagnosis of influenza from Nasopharyngeal swab specimens and  should not be used as a sole basis for treatment. Nasal washings and  aspirates are unacceptable for Xpert Xpress SARS-CoV-2/FLU/RSV  testing.  Fact Sheet for Patients: PinkCheek.be  Fact Sheet for Healthcare Providers: GravelBags.it  This test is not yet approved or cleared by the [place] FDA and  has been authorized for detection and/or diagnosis of SARS-CoV-2 by  FDA under an Emergency Use Authorization (EUA). This EUA will remain  in effect (meaning this test can be used) for the duration of the  Covid-19 declaration under Section 564(b)(1) of the Act, 21  U.S.C. section 360bbb-3(b)(1), unless the authorization is  terminated or revoked.    Respiratory Syncytial Virus by PCR NEGATIVE NEGATIVE Final    Comment: (NOTE) Fact Sheet for Patients: PinkCheek.be  Fact Sheet for Healthcare Providers: GravelBags.it  This test is not yet approved or cleared by the [place] FDA and  has been authorized for detection and/or diagnosis of SARS-CoV-2 by  FDA under an Emergency Use Authorization (EUA). This EUA will remain  in effect (meaning this test can be used) for the duration of the  COVID-19 declaration under Section 564(b)(1) of the Act, 21 U.S.C.  section 360bbb-3(b)(1),  unless the authorization is terminated or  revoked. Performed at [facility], [place] [address], [place], [place] [address]   MRSA PCR Screening     Status: None   Collection Time: 10/19/19 10:21 PM   Specimen: Nasal Mucosa; Nasopharyngeal  Result Value Ref Range Status   MRSA by PCR NEGATIVE NEGATIVE Final    Comment:        The GeneXpert MRSA Assay (FDA approved for NASAL specimens only), is one component of a comprehensive MRSA colonization surveillance program. It is not intended to diagnose MRSA infection nor to guide or monitor treatment for MRSA infections. Performed at [facility], [place] [address], [place], [place] [address]     Radiology Studies: No results found.   Tawnya Pujol T. [place]  If 7PM-7AM, please contact night-coverage [email or web address] 10/20/2019, 4:36 PM

## 2019-10-21 DIAGNOSIS — L03115 Cellulitis of right lower limb: Secondary | ICD-10-CM | POA: Diagnosis not present

## 2019-10-21 DIAGNOSIS — I2510 Atherosclerotic heart disease of native coronary artery without angina pectoris: Secondary | ICD-10-CM | POA: Diagnosis not present

## 2019-10-21 DIAGNOSIS — N1831 Chronic kidney disease, stage 3a: Secondary | ICD-10-CM | POA: Diagnosis not present

## 2019-10-21 DIAGNOSIS — E872 Acidosis: Secondary | ICD-10-CM

## 2019-10-21 DIAGNOSIS — A419 Sepsis, unspecified organism: Secondary | ICD-10-CM | POA: Diagnosis not present

## 2019-10-21 LAB — RENAL FUNCTION PANEL
Albumin: 2.6 g/dL — ABNORMAL LOW (ref 3.5–5.0)
Anion gap: 12 (ref 5–15)
BUN: 37 mg/dL — ABNORMAL HIGH (ref 6–20)
CO2: 17 mmol/L — ABNORMAL LOW (ref 22–32)
Calcium: 8.9 mg/dL (ref 8.9–10.3)
Chloride: 107 mmol/L (ref 98–111)
Creatinine, Ser: 1.27 mg/dL — ABNORMAL HIGH (ref 0.61–1.24)
GFR, Estimated: >60 mL/min (ref >60)
Glucose, Bld: 96 mg/dL (ref 70–99)
Phosphorus: 3.9 mg/dL (ref 2.5–4.6)
Potassium: 4.9 mmol/L (ref 3.5–5.1)
Sodium: 136 mmol/L (ref 135–145)

## 2019-10-21 LAB — GLUCOSE, CAPILLARY
Glucose-Capillary: 112 mg/dL — ABNORMAL HIGH (ref 70–99)
Glucose-Capillary: 174 mg/dL — ABNORMAL HIGH (ref 70–99)
Glucose-Capillary: 127 mg/dL — ABNORMAL HIGH (ref 70–99)
Glucose-Capillary: 138 mg/dL — ABNORMAL HIGH (ref 70–99)

## 2019-10-21 LAB — CBC WITH DIFFERENTIAL/PLATELET
Abs Immature Granulocytes: 0.07 10*3/uL (ref 0.00–0.07)
Basophils Absolute: 0 10*3/uL (ref 0.0–0.1)
Basophils Relative: 0 %
Eosinophils Absolute: 0.1 10*3/uL (ref 0.0–0.5)
Eosinophils Relative: 1 %
HCT: 38.9 % — ABNORMAL LOW (ref 39.0–52.0)
Hemoglobin: 12.8 g/dL — ABNORMAL LOW (ref 13.0–17.0)
Immature Granulocytes: 1 %
Lymphocytes Relative: 9 %
Lymphs Abs: 1.4 10*3/uL (ref 0.7–4.0)
MCH: 30.8 pg (ref 26.0–34.0)
MCHC: 32.9 g/dL (ref 30.0–36.0)
MCV: 93.5 fL (ref 80.0–100.0)
Monocytes Absolute: 1.5 10*3/uL — ABNORMAL HIGH (ref 0.1–1.0)
Monocytes Relative: 10 %
Neutro Abs: 12.4 10*3/uL — ABNORMAL HIGH (ref 1.7–7.7)
Neutrophils Relative %: 79 %
Platelets: 243 10*3/uL (ref 150–400)
RBC: 4.16 MIL/uL — ABNORMAL LOW (ref 4.22–5.81)
RDW: 14.8 % (ref 11.5–15.5)
WBC: 15.5 10*3/uL — ABNORMAL HIGH (ref 4.0–10.5)
nRBC: 0 % (ref 0.0–0.2)

## 2019-10-21 LAB — MAGNESIUM: Magnesium: 1.9 mg/dL (ref 1.7–2.4)

## 2019-10-21 MED ORDER — ACETAMINOPHEN 500 MG PO TABS
1000.0000 mg | ORAL_TABLET | Freq: Once | ORAL | Status: AC
  Administered 2019-10-21: 1000 mg via ORAL
  Filled 2019-10-21: qty 2

## 2019-10-21 MED ORDER — PREGABALIN 100 MG PO CAPS
200.0000 mg | ORAL_CAPSULE | Freq: Two times a day (BID) | ORAL | Status: DC
  Administered 2019-10-21 – 2019-10-24 (×6): 200 mg via ORAL
  Filled 2019-10-21 (×6): qty 2

## 2019-10-21 MED ORDER — HYDROCODONE-ACETAMINOPHEN 5-325 MG PO TABS
1.0000 | ORAL_TABLET | Freq: Four times a day (QID) | ORAL | Status: DC | PRN
  Administered 2019-10-21 – 2019-10-24 (×8): 1 via ORAL
  Filled 2019-10-21 (×8): qty 1

## 2019-10-21 MED ORDER — HYDROCODONE-ACETAMINOPHEN 5-325 MG PO TABS
1.0000 | ORAL_TABLET | Freq: Once | ORAL | Status: AC
  Administered 2019-10-21: 1 via ORAL
  Filled 2019-10-21: qty 1

## 2019-10-21 NOTE — Progress Notes (Signed)
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  In ED, HR 118.  RR 24.  WBC 20.2.  K5.8. Cr 1.29 (baseline).  BUN 34.  Glucose 146. Trop 11> 12.  CXR without acute finding.  CTA chest negative for PE or significant finding but cxtensive three-vessel coronary artery calcifications.  COVID-19 and influenza PCR negative.  Cultures obtained.  Started on broad-spectrum antibiotics and admitted for RLE cellulitis.  Blood cultures NGTD.  Cellulitis improving.  Subjective: Seen and examined earlier this morning.  Patient had severe right leg pain overnight.  Described the pain as shooting and throbbing.  Pain improved this morning but is still 7/10 on pain scale.  No other complaints.  Objective: Vitals:   10/20/19 2104 10/20/19 2230 10/21/19 0120 10/21/19 0428  BP: (!) 140/92  (!) 149/80 (!) 153/86  Pulse: 82  83 90  Resp: 20  (!) 21 18  Temp: 100.1 F (37.8 C)  98.5 F (36.9 C) 99.4 F (37.4 C)  TempSrc:   Oral Oral  SpO2: 96% 99% 97% 94%  Weight:      Height:        Intake/Output Summary (Last 24 hours) at 10/21/2019 1500 Last data filed at 10/21/2019 0824 Gross per 24 hour  Intake 2317.95 ml  Output 3200 ml  Net -882.05 ml   Filed Weights   10/19/19 0957 10/19/19 2200  Weight: (!) 137.4 kg (!) 145.5 kg    Examination:  GENERAL: No apparent distress.  Nontoxic. HEENT: MMM.  Vision and hearing grossly intact.  NECK: Supple.  No apparent JVD.  RESP:  No IWOB.  Fair aeration bilaterally. CVS:  RRR. Heart sounds normal.  ABD/GI/GU: BS+. Abd soft, NTND.  MSK/EXT:  Moves extremities. No apparent deformity.   Trace edema, right>left SKIN: Circumferential RLE erythema (improved). NEURO: Awake, alert and oriented appropriately.  No apparent focal neuro deficit. PSYCH: Calm. Normal affect.  Procedures:  None  Microbiology summarized: COVID-19 PCR negative. Influenza PCR negative. Blood cultures NGTD. MRSA PCR negative.  Assessment & Plan: Sepsis due to right lower extremity cellulitis: POA.  Met sepsis criteria with tachycardia, tachypnea, leukocytosis and RLE cellulitis on admission.  CRP elevated.  Bilateral lower extremity Doppler negative for DVT.  CTA chest without significant finding.  No signs of compartment syndrome.  Still with leukocytosis. -Vancomycin 10/12-10/4 -IV ceftriaxone 10/12>> -Pain control as below. -Encourage leg elevation. -Trend leukocytosis.  Controlled DM-2 with hyperglycemia: A1c 5.9%. Recent Labs  Lab 10/20/19 1700 10/20/19 2118 10/20/19 2129 10/21/19 0758 10/21/19 1218  GLUCAP 109* 127* 131* 112* 174*  -Continue current insulin regimen -Continue statin.  History of CAD/STEMI-no chest pain.  High-sensitivity troponin and EKG without significant finding.  CTA chest revealed significant three-vessel calcification -Continue aspirin, statin, Plavix and metoprolol -Outpatient follow-up with cardiology  Essential hypertension: BP slightly elevated. -Continue home amlodipine, metoprolol and Cardura  CKD-3A: Stable. -Continue monitoring -Discontinue Toradol.  Hyperkalemia: K5.8> 4.9.  Resolved. -Continue monitoring  Non-anion gap metabolic acidosis: Due to IV fluid? -Continue monitoring  Leukocytosis: Likely due to #1.  Improving. -Continue monitoring  Hyperlipidemia -Continue statin.  Debility/physical deconditioning -PT/OT consult  Chronic lower back pain/neuropathy -  Continue home medications-MS Contin -Change in Lyrica from 100 mg 3 times daily to 200 mg twice daily -Increase Norco to every 6 hours as needed  Morbid obesity Body  mass index is 50.24 kg/m.  -Encourage lifestyle change to lose weight.       DVT prophylaxis:  Subcu Lovenox  Code Status: Full code Family Communication: Patient and/or RN. Available if any question.  Status is: Inpatient  Remains inpatient appropriate because:IV treatments appropriate due to intensity of illness or inability to take PO and Inpatient level of care appropriate due to severity of illness   Dispo: The patient is from: Home              Anticipated d/c is to: Home              Anticipated d/c date is: 1 day              Patient currently is not medically stable to d/c.       Consultants:  None   Sch Meds:  Scheduled Meds: . allopurinol  400 mg Oral BID  . amLODipine  10 mg Oral QHS  . aspirin EC  81 mg Oral QHS  . atorvastatin  10 mg Oral QHS  . clopidogrel  75 mg Oral Daily  . doxazosin  2 mg Oral QHS  . enoxaparin (LOVENOX) injection  70 mg Subcutaneous Q24H  . insulin aspart  0-20 Units Subcutaneous TID WC  . insulin aspart  0-5 Units Subcutaneous QHS  . metoprolol succinate  150 mg Oral Daily  . morphine  15 mg Oral Q12H  . pregabalin  100 mg Oral TID   Continuous Infusions: . sodium chloride Stopped (10/21/19 0508)  . cefTRIAXone (ROCEPHIN)  IV Stopped (10/20/19 1703)  . vancomycin 200 mL/hr at 10/21/19 0602   PRN Meds:.acetaminophen **OR** acetaminophen, albuterol, HYDROcodone-acetaminophen, ondansetron **OR** ondansetron (ZOFRAN) IV, polyethylene glycol  Antimicrobials: Anti-infectives (From admission, onward)   Start     Dose/Rate Route Frequency Ordered Stop   10/20/19 0600  vancomycin (VANCOCIN) IVPB 1000 mg/200 mL premix        1,000 mg 200 mL/hr over 60 Minutes Intravenous Every 12 hours 10/19/19 1750     10/19/19 1800  cefTRIAXone (ROCEPHIN) 2 g in sodium chloride 0.9 % 100 mL IVPB        2 g 200 mL/hr over 30 Minutes Intravenous Every 24 hours 10/19/19 1748     10/19/19 1800  vancomycin (VANCOCIN) 2,500 mg in sodium chloride 0.9  % 500 mL IVPB        2,500 mg 250 mL/hr over 120 Minutes Intravenous  Once 10/19/19 1750 10/19/19 2056   10/19/19 1700  cefTRIAXone (ROCEPHIN) 2 g in sodium chloride 0.9 % 100 mL IVPB        2 g 200 mL/hr over 30 Minutes Intravenous  Once 10/19/19 1654 10/19/19 1750       I have personally reviewed the following labs and images: CBC: Recent Labs  Lab 10/19/19 1006 10/20/19 0029 10/21/19 0506  WBC 20.2* 17.4* 15.5*  NEUTROABS  --   --  12.4*  HGB 14.0 13.0 12.8*  HCT 44.0 39.8 38.9*  MCV 95.9 94.8 93.5  PLT 267 220 243   BMP &GFR Recent Labs  Lab 10/19/19 1006 10/19/19 1745 10/20/19 0029 10/21/19 0506  NA 137 134* 134* 136  K 5.8* 5.4* 4.9 4.9  CL 106 104 105 107  CO2 22 20* 20* 17*  GLUCOSE 146* 77 76 96  BUN 34* 35* 37* 37*  CREATININE 1.29* 1.24 1.36* 1.27*  CALCIUM 9.1 8.9 8.7* 8.9  MG  --   --   --  1.9  PHOS  --   --   --  3.9   Estimated Creatinine Clearance: 92.1 mL/min (A) (by C-G formula based on SCr of 1.27 mg/dL (H)). Liver & Pancreas: Recent Labs  Lab 10/19/19 1745 10/20/19 0029 10/21/19 0506  AST 27 14*  --   ALT 17 16  --   ALKPHOS 93 73  --   BILITOT 1.2 0.8  --   PROT 6.6 6.1*  --   ALBUMIN 2.9* 2.6* 2.6*   No results for input(s): LIPASE, AMYLASE in the last 168 hours. No results for input(s): AMMONIA in the last 168 hours. Diabetic: Recent Labs    10/20/19 0029  HGBA1C 5.9*   Recent Labs  Lab 10/20/19 1700 10/20/19 2118 10/20/19 2129 10/21/19 0758 10/21/19 1218  GLUCAP 109* 127* 131* 112* 174*   Cardiac Enzymes: No results for input(s): CKTOTAL, CKMB, CKMBINDEX, TROPONINI in the last 168 hours. No results for input(s): PROBNP in the last 8760 hours. Coagulation Profile: No results for input(s): INR, PROTIME in the last 168 hours. Thyroid Function Tests: No results for input(s): TSH, T4TOTAL, FREET4, T3FREE, THYROIDAB in the last 72 hours. Lipid Profile: No results for input(s): CHOL, HDL, LDLCALC, TRIG, CHOLHDL,  LDLDIRECT in the last 72 hours. Anemia Panel: No results for input(s): VITAMINB12, FOLATE, FERRITIN, TIBC, IRON, RETICCTPCT in the last 72 hours. Urine analysis:    Component Value Date/Time   COLORURINE YELLOW 03/31/2019 [place] 03/31/2019 0137   LABSPEC 1.011 03/31/2019 0137   PHURINE 5.0 03/31/2019 0137   GLUCOSEU 150 (A) 03/31/2019 0137   HGBUR SMALL (A) 03/31/2019 0137   BILIRUBINUR NEGATIVE 03/31/2019 0137   KETONESUR NEGATIVE 03/31/2019 0137   PROTEINUR NEGATIVE 03/31/2019 0137   NITRITE NEGATIVE 03/31/2019 0137   LEUKOCYTESUR NEGATIVE 03/31/2019 0137   Sepsis Labs: Invalid input(s): PROCALCITONIN, [place]  Microbiology: Recent Results (from the past 240 hour(s))  Blood culture (routine x 2)     Status: None (Preliminary result)   Collection Time: 10/19/19  2:30 PM   Specimen: BLOOD  Result Value Ref Range Status   Specimen Description BLOOD LEFT ANTECUBITAL  Final   Special Requests   Final    BOTTLES DRAWN AEROBIC AND ANAEROBIC Blood Culture adequate volume   Culture   Final    NO GROWTH 2 DAYS Performed at [facility], [address] [place], [place] [address]    Report Status PENDING  Incomplete  Resp Panel by RT PCR (RSV, Flu A&B, Covid) - Nasopharyngeal Swab     Status: None   Collection Time: 10/19/19  4:11 PM   Specimen: Nasopharyngeal Swab  Result Value Ref Range Status   SARS Coronavirus 2 by RT PCR NEGATIVE NEGATIVE Final    Comment: (NOTE) SARS-CoV-2 target nucleic acids are NOT DETECTED.  The SARS-CoV-2 RNA is generally detectable in upper respiratoy specimens during the acute phase of infection. The lowest concentration of SARS-CoV-2 viral copies this assay can detect is 131 copies/mL. A negative result does not preclude SARS-Cov-2 infection and should not be used as the sole basis for treatment or other patient management decisions. A negative result may occur with  improper specimen collection/handling, submission  of specimen other than nasopharyngeal swab, presence of viral mutation(s) within the areas targeted by this assay, and inadequate number of viral copies (<131  copies/mL). A negative result must be combined with clinical observations, patient history, and epidemiological information. The expected result is Negative.  Fact Sheet for Patients:  PinkCheek.be  Fact Sheet for Healthcare Providers:  GravelBags.it  This test is no t yet approved or cleared by the [place] FDA and  has been authorized for detection and/or diagnosis of SARS-CoV-2 by FDA under an Emergency Use Authorization (EUA). This EUA will remain  in effect (meaning this test can be used) for the duration of the COVID-19 declaration under Section 564(b)(1) of the Act, 21 U.S.C. section 360bbb-3(b)(1), unless the authorization is terminated or revoked sooner.     Influenza A by PCR NEGATIVE NEGATIVE Final   Influenza B by PCR NEGATIVE NEGATIVE Final    Comment: (NOTE) The Xpert Xpress SARS-CoV-2/FLU/RSV assay is intended as an aid in  the diagnosis of influenza from Nasopharyngeal swab specimens and  should not be used as a sole basis for treatment. Nasal washings and  aspirates are unacceptable for Xpert Xpress SARS-CoV-2/FLU/RSV  testing.  Fact Sheet for Patients: PinkCheek.be  Fact Sheet for Healthcare Providers: GravelBags.it  This test is not yet approved or cleared by the [place] FDA and  has been authorized for detection and/or diagnosis of SARS-CoV-2 by  FDA under an Emergency Use Authorization (EUA). This EUA will remain  in effect (meaning this test can be used) for the duration of the  Covid-19 declaration under Section 564(b)(1) of the Act, 21  U.S.C. section 360bbb-3(b)(1), unless the authorization is  terminated or revoked.    Respiratory Syncytial Virus by PCR NEGATIVE  NEGATIVE Final    Comment: (NOTE) Fact Sheet for Patients: PinkCheek.be  Fact Sheet for Healthcare Providers: GravelBags.it  This test is not yet approved or cleared by the [place] FDA and  has been authorized for detection and/or diagnosis of SARS-CoV-2 by  FDA under an Emergency Use Authorization (EUA). This EUA will remain  in effect (meaning this test can be used) for the duration of the  COVID-19 declaration under Section 564(b)(1) of the Act, 21 U.S.C.  section 360bbb-3(b)(1), unless the authorization is terminated or  revoked. Performed at [facility], [place] [address], [place], [place] [address]   MRSA PCR Screening     Status: None   Collection Time: 10/19/19 10:21 PM   Specimen: Nasal Mucosa; Nasopharyngeal  Result Value Ref Range Status   MRSA by PCR NEGATIVE NEGATIVE Final    Comment:        The GeneXpert MRSA Assay (FDA approved for NASAL specimens only), is one component of a comprehensive MRSA colonization surveillance program. It is not intended to diagnose MRSA infection nor to guide or monitor treatment for MRSA infections. Performed at [facility], [place] [address], [place], [place] [address]   Blood culture (routine x 2)     Status: None (Preliminary result)   Collection Time: 10/20/19 12:29 AM   Specimen: BLOOD  Result Value Ref Range Status   Specimen Description BLOOD RIGHT ANTECUBITAL  Final   Special Requests   Final    BOTTLES DRAWN AEROBIC AND ANAEROBIC Blood Culture adequate volume   Culture   Final    NO GROWTH 1 DAY Performed at [facility], [place] [address], [place], [place] [address]    Report Status PENDING  Incomplete    Radiology Studies: No results found.   Rayson Rando T. [place]  If 7PM-7AM, please contact night-coverage [email or web address] 10/21/2019, 3:00 PM

## 2019-10-21 NOTE — Evaluation (Signed)
Physical Therapy Evaluation Patient Details Name: Jesus Cruz MRN: [id number] DOB: 1965-06-22 Today's Date: 10/21/2019   History of Present Illness  Pt is 54 yo male with PMH including CVA, CAD, DVT/PE, DM, HTN, chronic pain, CKD, and morbid obesity.  Pt reports extensive hx with back including surgeries, fractures, MRSA infection, and nerve damage leading to decreased mobility and unable to dorsiflex on R side.  Pt presented with R LE pain - admitted with sepsis due to R LE cellulitis.  Clinical Impression   Pt admitted with above diagnosis. Pt limited at eval due to R LE pain.  He was able to transition to a seated position and perform squat pivot to the chair with supervision - min guard level of assist.  Pt with extensive hx of mobility issues related to back injuries/surgeries, gout, and obesity.  He is normally independent at home using a w/c and can ambulate in community some using crutches.  Pt helps to take care of his elderly mother.  Pt expected to be able to progress to baseline level as R LE cellulitis pain improves.  Pt was getting outpatient PT at [facility] - may benefit from [place] prior to transitioning back to outpatient therapy. Pt currently with functional limitations due to the deficits listed below (see PT Problem List). Pt will benefit from skilled PT to increase their independence and safety with mobility to allow discharge to the venue listed below.       Follow Up Recommendations Home health PT    Equipment Recommendations  None recommended by PT    Recommendations for Other Services       Precautions / Restrictions Precautions Precautions: Fall      Mobility  Bed Mobility Overal bed mobility: Needs Assistance Bed Mobility: Supine to Sit     Supine to sit: Modified independent (Device/Increase time);HOB elevated        Transfers Overall transfer level: Needs assistance Equipment used:  (held armrest of chair and bed) Transfers: Sit  to/from [name] Sit to Stand: Min guard   Squat pivot transfers: Min guard     General transfer comment: toward L side; min g for safety  Ambulation/Gait             General Gait Details: deferred due to pain  Stairs            Wheelchair Mobility    Modified Rankin (Stroke Patients Only)       Balance Overall balance assessment: Needs assistance Sitting-balance support: Feet supported;No upper extremity supported Sitting balance-Leahy Scale: Good     Standing balance support: Bilateral upper extremity supported Standing balance-Leahy Scale: Poor Standing balance comment: required UE support                             Pertinent Vitals/Pain Pain Assessment: 0-10 Pain Score: 8  Pain Location: R leg Pain Descriptors / Indicators: Throbbing Pain Intervention(s): Limited activity within patient's tolerance;Premedicated before session;Monitored during session;Repositioned;Relaxation    Home Living Family/patient expects to be discharged to:: Private residence Living Arrangements: Parent (pt takes care of his mother) Available Help at Discharge: Other (Comment) (mother's caregiver are there 8 hr a day) Type of Home: House Home Access: Ramped entrance     Home Layout: One level Home Equipment: [place] - 2 wheels;Wheelchair - manual;Crutches;Wheelchair - power;Grab bars - tub/shower;Grab bars - toilet;Adaptive equipment;Shower seat - built in Additional Comments: pt helps care for mother at night  who is bedbound    Prior Function Level of Independence: Needs assistance   Gait / Transfers Assistance Needed: Mostly uses w/c at home, reports that when he goes to store he uses crutches to walk in and then leans on cart (goes to small stores - avoids [employer])  ADL's / Homemaking Assistance Needed: Independent with ADLs and IADLs (sitting); pt takes care of his 10 yo mother at night and he has a caregiver for her during day; pt drives  and does grocery shopping  Comments: Pt reports mobility deficits largely come from extensive back history including fracture, surgeries, infection, and nerve damage.  Started in 2003 but worsened in 2016 after surgery. Addionally, reports he needs R hip and knee replacement.     Hand Dominance   Dominant Hand: Right    Extremity/Trunk Assessment   Upper Extremity Assessment Upper Extremity Assessment: Overall WFL for tasks assessed    Lower Extremity Assessment Lower Extremity Assessment: LLE deficits/detail;RLE deficits/detail RLE Deficits / Details: ROM: WFL but painful at ankle; MMT: hip 3/5, knee 3/5 (not further tested due to painful), R ankle 3/5 PF and 0/5 DF (baseline); significant edema and erythema LLE Deficits / Details: ROM WFL; MMT 5/5    Cervical / Trunk Assessment Cervical / Trunk Assessment: Normal  Communication   Communication: No difficulties  Cognition Arousal/Alertness: Awake/alert Behavior During Therapy: WFL for tasks assessed/performed Overall Cognitive Status: Within Functional Limits for tasks assessed                                        General Comments General comments (skin integrity, edema, etc.): Pt reports R LE painful but agreeable to mobility and ROM - "I know I need to."    Exercises Other Exercises Other Exercises: gentle PROM R ankle x 20   Assessment/Plan    PT Assessment Patient needs continued PT services  PT Problem List Decreased strength;Decreased mobility;Decreased range of motion;Decreased activity tolerance;Decreased balance;Decreased knowledge of use of DME;Pain       PT Treatment Interventions DME instruction;Therapeutic activities;Gait training;Therapeutic exercise;Modalities;Patient/family education;Balance training;Functional mobility training    PT Goals (Current goals can be found in the Care Plan section)  Acute Rehab PT Goals Patient Stated Goal: decrease pain PT Goal Formulation: With  patient Time For Goal Achievement: 11/04/19 Potential to Achieve Goals: Good Additional Goals Additional Goal #1: Pt will propel w/c 200' without assistance    Frequency Min 3X/week   Barriers to discharge Decreased caregiver support      Co-evaluation               AM-PAC PT "6 Clicks" Mobility  Outcome Measure Help needed turning from your back to your side while in a flat bed without using bedrails?: A Little Help needed moving from lying on your back to sitting on the side of a flat bed without using bedrails?: A Little Help needed moving to and from a bed to a chair (including a wheelchair)?: A Little Help needed standing up from a chair using your arms (e.g., wheelchair or bedside chair)?: A Little Help needed to walk in hospital room?: A Lot Help needed climbing 3-5 steps with a railing? : Total 6 Click Score: 15    End of Session   Activity Tolerance: Patient limited by pain Patient left: in chair;with call bell/phone within reach Nurse Communication: Mobility status PT Visit Diagnosis: Pain;Other abnormalities of gait and  mobility (R26.89);Muscle weakness (generalized) (M62.81) Pain - Right/Left: Right Pain - part of body: Ankle and joints of foot    Time: 2956-2130 PT Time Calculation (min) (ACUTE ONLY): 25 min   Charges:   PT Evaluation $PT Eval Moderate Complexity: 1 Jesus Cruz, PT Acute Rehab Services Pager [phone] [facility] [phone]    Jesus Cruz 10/21/2019, 4:48 PM

## 2019-10-21 NOTE — Progress Notes (Signed)
Pt c/o of severe right leg pain. Pt is crying in pain and rating his pain 10/10 with  shooting/throbbing pain. Made on call aware. Ordered x1 norco. Will continue to monitor pt.

## 2019-10-22 ENCOUNTER — Inpatient Hospital Stay (HOSPITAL_COMMUNITY): Payer: Medicare Other

## 2019-10-22 DIAGNOSIS — I2510 Atherosclerotic heart disease of native coronary artery without angina pectoris: Secondary | ICD-10-CM | POA: Diagnosis not present

## 2019-10-22 DIAGNOSIS — E871 Hypo-osmolality and hyponatremia: Secondary | ICD-10-CM

## 2019-10-22 DIAGNOSIS — N1831 Chronic kidney disease, stage 3a: Secondary | ICD-10-CM | POA: Diagnosis not present

## 2019-10-22 DIAGNOSIS — A419 Sepsis, unspecified organism: Secondary | ICD-10-CM | POA: Diagnosis not present

## 2019-10-22 DIAGNOSIS — L03115 Cellulitis of right lower limb: Secondary | ICD-10-CM | POA: Diagnosis not present

## 2019-10-22 LAB — CBC WITH DIFFERENTIAL/PLATELET
Abs Immature Granulocytes: 0.07 10*3/uL (ref 0.00–0.07)
Basophils Absolute: 0 10*3/uL (ref 0.0–0.1)
Basophils Relative: 0 %
Eosinophils Absolute: 0.2 10*3/uL (ref 0.0–0.5)
Eosinophils Relative: 1 %
HCT: 37.7 % — ABNORMAL LOW (ref 39.0–52.0)
Hemoglobin: 11.9 g/dL — ABNORMAL LOW (ref 13.0–17.0)
Immature Granulocytes: 1 %
Lymphocytes Relative: 10 %
Lymphs Abs: 1.3 10*3/uL (ref 0.7–4.0)
MCH: 30.1 pg (ref 26.0–34.0)
MCHC: 31.6 g/dL (ref 30.0–36.0)
MCV: 95.4 fL (ref 80.0–100.0)
Monocytes Absolute: 1.6 10*3/uL — ABNORMAL HIGH (ref 0.1–1.0)
Monocytes Relative: 12 %
Neutro Abs: 10.5 10*3/uL — ABNORMAL HIGH (ref 1.7–7.7)
Neutrophils Relative %: 76 %
Platelets: 239 10*3/uL (ref 150–400)
RBC: 3.95 MIL/uL — ABNORMAL LOW (ref 4.22–5.81)
RDW: 14.6 % (ref 11.5–15.5)
WBC: 13.7 10*3/uL — ABNORMAL HIGH (ref 4.0–10.5)
nRBC: 0 % (ref 0.0–0.2)

## 2019-10-22 LAB — GLUCOSE, CAPILLARY
Glucose-Capillary: 91 mg/dL (ref 70–99)
Glucose-Capillary: 117 mg/dL — ABNORMAL HIGH (ref 70–99)
Glucose-Capillary: 101 mg/dL — ABNORMAL HIGH (ref 70–99)
Glucose-Capillary: 91 mg/dL (ref 70–99)

## 2019-10-22 LAB — URIC ACID: Uric Acid, Serum: 5.9 mg/dL (ref 3.7–8.6)

## 2019-10-22 LAB — RENAL FUNCTION PANEL
Albumin: 2.3 g/dL — ABNORMAL LOW (ref 3.5–5.0)
Anion gap: 9 (ref 5–15)
BUN: 30 mg/dL — ABNORMAL HIGH (ref 6–20)
CO2: 20 mmol/L — ABNORMAL LOW (ref 22–32)
Calcium: 8.5 mg/dL — ABNORMAL LOW (ref 8.9–10.3)
Chloride: 105 mmol/L (ref 98–111)
Creatinine, Ser: 1.25 mg/dL — ABNORMAL HIGH (ref 0.61–1.24)
GFR, Estimated: >60 mL/min (ref >60)
Glucose, Bld: 142 mg/dL — ABNORMAL HIGH (ref 70–99)
Phosphorus: 3.6 mg/dL (ref 2.5–4.6)
Potassium: 4.7 mmol/L (ref 3.5–5.1)
Sodium: 134 mmol/L — ABNORMAL LOW (ref 135–145)

## 2019-10-22 LAB — MAGNESIUM: Magnesium: 1.7 mg/dL (ref 1.7–2.4)

## 2019-10-22 MED ORDER — DOXYCYCLINE HYCLATE 100 MG PO TABS
100.0000 mg | ORAL_TABLET | Freq: Two times a day (BID) | ORAL | Status: DC
  Administered 2019-10-22 – 2019-10-24 (×5): 100 mg via ORAL
  Filled 2019-10-22 (×5): qty 1

## 2019-10-22 MED ORDER — COLCHICINE 0.6 MG PO TABS
1.2000 mg | ORAL_TABLET | Freq: Once | ORAL | Status: AC
  Administered 2019-10-22: 1.2 mg via ORAL
  Filled 2019-10-22: qty 2

## 2019-10-22 MED ORDER — COLCHICINE 0.6 MG PO TABS
0.6000 mg | ORAL_TABLET | Freq: Every day | ORAL | Status: DC
  Administered 2019-10-23 – 2019-10-24 (×2): 0.6 mg via ORAL
  Filled 2019-10-22 (×2): qty 1

## 2019-10-22 MED ORDER — DOXYCYCLINE HYCLATE 100 MG PO TABS
100.0000 mg | ORAL_TABLET | Freq: Two times a day (BID) | ORAL | 0 refills | Status: DC
Start: 2019-10-22 — End: 2019-10-27

## 2019-10-22 MED ORDER — FENTANYL CITRATE (PF) 100 MCG/2ML IJ SOLN
25.0000 ug | INTRAMUSCULAR | Status: DC | PRN
  Administered 2019-10-22 – 2019-10-23 (×4): 25 ug via INTRAVENOUS
  Filled 2019-10-22 (×4): qty 2

## 2019-10-22 NOTE — TOC Initial Note (Signed)
Transition of Care [facility]) - Initial/Assessment Note    Patient Details  Name: Jesus Cruz MRN: [id number] Date of Birth: 1965-04-28  Transition of Care [facility]) CM/SW Contact:    Marilu Favre, RN Phone Number: 10/22/2019, 11:54 AM  Clinical Narrative:                 Patient from home with mother. Confirmed face sheet information. Discussed home health PT. Offered choice . Patient would like [place]. Referral given to [place] with [facility]    Expected Discharge Plan: [place] Barriers to Discharge: Continued Medical Work up   Patient Goals and CMS Choice Patient states their goals for this hospitalization and ongoing recovery are:: to return to home CMS Medicare.gov Compare Post Acute Care list provided to:: Patient Choice offered to / list presented to : Patient  Expected Discharge Plan and Services Expected Discharge Plan: [place] Choice: [place] arrangements for the past 2 months: Single Family Home Expected Discharge Date: 10/22/19               DME Arranged: N/A DME Agency: NA       HH Arranged: PT HH Agency: [place] ([place]) Date [place]: 10/22/19 Time [place]: 74 Representative spoke with at [place]: [place]  Prior Living Arrangements/Services Living arrangements for the past 2 months: [place] Lives with:: Parents Patient language and need for interpreter reviewed:: Yes Do you feel safe going back to the place where you live?: Yes      Need for Family Participation in Patient Care: Yes (Comment) Care giver support system in place?: Yes (comment)   Criminal Activity/Legal Involvement Pertinent to Current Situation/Hospitalization: No - Comment as needed  Activities of Daily Living Home Assistive Devices/Equipment: Crutches ADL Screening (condition at time of admission) Patient's cognitive ability adequate to safely  complete daily activities?: Yes Is the patient deaf or have difficulty hearing?: No Does the patient have difficulty seeing, even when wearing glasses/contacts?: Yes Does the patient have difficulty concentrating, remembering, or making decisions?: No Patient able to express need for assistance with ADLs?: Yes Does the patient have difficulty dressing or bathing?: No Independently performs ADLs?: Yes (appropriate for developmental age) Does the patient have difficulty walking or climbing stairs?: Yes Weakness of Legs: Both Weakness of Arms/Hands: None  Permission Sought/Granted   Permission granted to share information with : No              Emotional Assessment Appearance:: Appears stated age Attitude/Demeanor/Rapport: Engaged Affect (typically observed): Accepting Orientation: : Oriented to Self, Oriented to Place, Oriented to  Time, Oriented to Situation Alcohol / Substance Use: Not Applicable Psych Involvement: No (comment)  Admission diagnosis:  Cellulitis [L03.90] Cellulitis of right lower extremity [L03.115] Chest pain, unspecified type [R07.9] Patient Active Problem List   Diagnosis Date Noted  . Cellulitis 10/19/2019  . Obesity, Class III, BMI 40-49.9 (morbid obesity) ([place]) 10/19/2019  . Leukocytosis 10/19/2019  . CKD (chronic kidney disease), stage III ([place]) 10/19/2019  . CAD (coronary artery disease) 10/19/2019  . HTN (hypertension) 10/19/2019  . Pressure injury of skin 04/01/2019  . Acute pulmonary edema (HCC)   . STEMI involving oth coronary artery of inferior wall ([place])   . Hyperkalemia 03/30/2019  . Hyperglycemia 03/30/2019  . Chest pain with moderate risk for cardiac etiology 03/30/2019  . Acute renal failure with acute tubular necrosis superimposed  on stage 3a chronic kidney disease ([place])   . Stroke with cerebral ischemia ([place]) 09/16/2013   PCP:  Charlotte Sanes, MD Pharmacy:   [id number] - [place], [place] [address] Phone: [phone] Fax: [phone]     Social Determinants of Health (SDOH) Interventions    Readmission Risk Interventions No flowsheet data found.

## 2019-10-22 NOTE — Progress Notes (Signed)
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  In ED, HR 118.  RR 24.  WBC 20.2.  K5.8. Cr 1.29 (baseline).  BUN 34.  Glucose 146. Trop 11> 12.  CXR without acute finding.  CTA chest negative for PE or significant finding but cxtensive three-vessel coronary artery calcifications.  COVID-19 and influenza PCR negative.  Cultures obtained.  Started on broad-spectrum antibiotics and admitted for RLE cellulitis.  Blood cultures NGTD.  Lower extremity Doppler negative for DVT.  Cellulitis improving but continues to have significant pain in right foot. He has some tophi at the base of his right great toe.  Started on colchicine.  Subjective: Seen and examined earlier this morning.  Continues to endorse severe pain in his right foot.  Still with some swelling in right edema and right lower extremity.  Objective: Vitals:   10/21/19 2103 10/22/19 0423 10/22/19 1149 10/22/19 1450  BP: 133/81 110/78 114/65 120/72  Pulse: 83 74 85 85  Resp: _0 Temp: 98.4 F (36.9 C) 98.6 F (37 C) 98.3 F (36.8 C) 98.5 F (36.9 C)  TempSrc: Oral Oral Oral Oral  SpO2: 94% 93% 94% 93%  Weight:  (!) 144.5 kg    Height:        Intake/Output Summary (Last 24 hours) at 10/22/2019 1451 Last data filed at 10/22/2019 1400 Gross per 24 hour  Intake --  Output 3650 ml  Net -3650 ml   Filed Weights   10/19/19 0957 10/19/19 2200 10/22/19 0423  Weight: (!) 137.4 kg (!) 145.5 kg (!) 144.5 kg    Examination:  GENERAL: No apparent distress.  Nontoxic. HEENT: MMM.  Vision and hearing grossly intact.  NECK: Supple.  No apparent JVD.   RESP:  No IWOB.  Fair aeration bilaterally. CVS:  RRR. Heart sounds normal.  ABD/GI/GU: BS+. Abd soft, NTND.  MSK/EXT:  Moves extremities.  Erythema and swelling in RLE.  2+ DP pulses bilaterally. Tophi at the base of right great toe.  SKIN: Circumferential RLE erythema and swelling.  NEURO: Awake, alert and oriented appropriately.  No apparent focal neuro deficit. PSYCH: Calm. Normal affect.  Procedures:  None  Microbiology summarized: COVID-19 PCR negative. Influenza PCR negative. Blood cultures NGTD. MRSA PCR negative.  Assessment & Plan: Sepsis due to right lower extremity cellulitis: POA.  Met sepsis criteria with tachycardia, tachypnea, leukocytosis and RLE cellulitis on admission.  CRP elevated.  Bilateral lower extremity Doppler negative for DVT. Still with leukocytosis but improving.  Blood cultures negative.  Uric acid within normal range but has some tophi at the base of his right great toe with persistent pain and erythema. No signs of compartment syndrome.  Right foot x-ray with soft tissue swelling consistent with cellulitis and multifocal osteoarthritis but no evidence of osteomyelitis or fluid collection. -Vancomycin 10/12-10/14 -IV ceftriaxone 10/12>> 10/15 -Doxycycline 10/15> -Start colchicine for possible acute gout flareup -Pain control as below. -Encourage leg elevation. -Trend leukocytosis. -Discontinue IV fluid  Controlled DM-2 with hyperglycemia: A1c 5.9%. Recent Labs  Lab 10/21/19 1218 10/21/19 1653 10/21/19 2104 10/22/19 0801 10/22/19 1149  GLUCAP 174* 127* 138* 91 117*  -Continue  current insulin regimen -Continue statin.  History of CAD/STEMI-no chest pain.  High-sensitivity troponin and EKG without significant finding.  CTA chest revealed significant three-vessel calcification -Continue aspirin, statin, Plavix and metoprolol -Outpatient follow-up with cardiology  Essential hypertension: Normotensive. -Continue home amlodipine, metoprolol  and Cardura  CKD-3A: Stable. -Continue monitoring -Discontinue Toradol.  Hyperkalemia: K5.8> 4.7.  Resolved. -Continue monitoring  Non-anion gap metabolic acidosis: Due to IV fluid? -Discontinue IV fluid. -Continue monitoring  Normocytic anemia: H&H relatively stable. -Continue monitoring  Hyponatremia: Na 134.  Due to LR fluid? -Recheck in the morning  Leukocytosis: Likely due to #1.  Improving. -Continue monitoring  Hyperlipidemia -Continue statin.  Debility/physical deconditioning -PT/OT consult  Chronic lower back pain/neuropathy -Continue home medications-MS Contin -Changed in Lyrica from 100 mg 3 times daily to 200 mg twice daily on 10/14 -Increased Norco to every 6 hours as needed on 10/14 -Add IV fentanyl for breakthrough pain  Morbid obesity Body mass index is 49.89 kg/m.  -Encourage lifestyle change to lose weight.       DVT prophylaxis:  Subcu Lovenox  Code Status: Full code Family Communication: Patient and/or RN. Available if any question.  Status is: Inpatient  Remains inpatient appropriate because:Ongoing active pain requiring inpatient pain management and Inpatient level of care appropriate due to severity of illness   Dispo: The patient is from: Home              Anticipated d/c is to: Home              Anticipated d/c date is: 1 day              Patient currently is not medically stable to d/c.       Consultants:  None   Sch Meds:  Scheduled Meds: . allopurinol  400 mg Oral BID  . amLODipine  10 mg Oral QHS  . aspirin EC  81 mg Oral QHS  . atorvastatin  10 mg Oral QHS  . clopidogrel  75 mg Oral Daily  . [START ON 10/23/2019] colchicine  0.6 mg Oral Daily  . doxazosin  2 mg Oral QHS  . doxycycline  100 mg Oral Q12H  . enoxaparin (LOVENOX) injection  70 mg Subcutaneous Q24H  . insulin aspart  0-20 Units Subcutaneous TID WC  . insulin aspart  0-5 Units Subcutaneous QHS  . metoprolol succinate  150 mg Oral Daily  .  morphine  15 mg Oral Q12H  . pregabalin  200 mg Oral BID   Continuous Infusions:  PRN Meds:.acetaminophen **OR** acetaminophen, albuterol, HYDROcodone-acetaminophen, ondansetron **OR** ondansetron (ZOFRAN) IV, polyethylene glycol  Antimicrobials: Anti-infectives (From admission, onward)   Start     Dose/Rate Route Frequency Ordered Stop   10/22/19 1000  doxycycline (VIBRA-TABS) tablet 100 mg        100 mg Oral Every 12 hours 10/22/19 0801     10/22/19 0000  doxycycline (VIBRA-TABS) 100 MG tablet       Note to Pharmacy: May dispense capsule if insurance prefers.   100 mg Oral Every 12 hours 10/22/19 0809     10/20/19 0600  vancomycin (VANCOCIN) IVPB 1000 mg/200 mL premix  Status:  Discontinued        1,000 mg 200 mL/hr over 60 Minutes Intravenous Every 12 hours 10/19/19 1750 10/21/19 1503   10/19/19 1800  cefTRIAXone (ROCEPHIN) 2 g in sodium chloride 0.9 % 100 mL IVPB  Status:  Discontinued        2 g 200 mL/hr over 30  Minutes Intravenous Every 24 hours 10/19/19 1748 10/22/19 0801   10/19/19 1800  vancomycin (VANCOCIN) 2,500 mg in sodium chloride 0.9 % 500 mL IVPB        2,500 mg 250 mL/hr over 120 Minutes Intravenous  Once 10/19/19 1750 10/19/19 2056   10/19/19 1700  cefTRIAXone (ROCEPHIN) 2 g in sodium chloride 0.9 % 100 mL IVPB        2 g 200 mL/hr over 30 Minutes Intravenous  Once 10/19/19 1654 10/19/19 1750       I have personally reviewed the following labs and images: CBC: Recent Labs  Lab 10/19/19 1006 10/20/19 0029 10/21/19 0506 10/22/19 0010  WBC 20.2* 17.4* 15.5* 13.7*  NEUTROABS  --   --  12.4* 10.5*  HGB 14.0 13.0 12.8* 11.9*  HCT 44.0 39.8 38.9* 37.7*  MCV 95.9 94.8 93.5 95.4  PLT 267 220 243 239   BMP &GFR Recent Labs  Lab 10/19/19 1006 10/19/19 1745 10/20/19 0029 10/21/19 0506 10/22/19 0010  NA 137 134* 134* 136 134*  K 5.8* 5.4* 4.9 4.9 4.7  CL 106 104 105 107 105  CO2 22 20* 20* 17* 20*  GLUCOSE 146* 77 76 96 142*  BUN 34* 35* 37* 37* 30*   CREATININE 1.29* 1.24 1.36* 1.27* 1.25*  CALCIUM 9.1 8.9 8.7* 8.9 8.5*  MG  --   --   --  1.9 1.7  PHOS  --   --   --  3.9 3.6   Estimated Creatinine Clearance: 93.2 mL/min (A) (by C-G formula based on SCr of 1.25 mg/dL (H)). Liver & Pancreas: Recent Labs  Lab 10/19/19 1745 10/20/19 0029 10/21/19 0506 10/22/19 0010  AST 27 14*  --   --   ALT 17 16  --   --   ALKPHOS 93 73  --   --   BILITOT 1.2 0.8  --   --   PROT 6.6 6.1*  --   --   ALBUMIN 2.9* 2.6* 2.6* 2.3*   No results for input(s): LIPASE, AMYLASE in the last 168 hours. No results for input(s): AMMONIA in the last 168 hours. Diabetic: Recent Labs    10/20/19 0029  HGBA1C 5.9*   Recent Labs  Lab 10/21/19 1218 10/21/19 1653 10/21/19 2104 10/22/19 0801 10/22/19 1149  GLUCAP 174* 127* 138* 91 117*   Cardiac Enzymes: No results for input(s): CKTOTAL, CKMB, CKMBINDEX, TROPONINI in the last 168 hours. No results for input(s): PROBNP in the last 8760 hours. Coagulation Profile: No results for input(s): INR, PROTIME in the last 168 hours. Thyroid Function Tests: No results for input(s): TSH, T4TOTAL, FREET4, T3FREE, THYROIDAB in the last 72 hours. Lipid Profile: No results for input(s): CHOL, HDL, LDLCALC, TRIG, CHOLHDL, LDLDIRECT in the last 72 hours. Anemia Panel: No results for input(s): VITAMINB12, FOLATE, FERRITIN, TIBC, IRON, RETICCTPCT in the last 72 hours. Urine analysis:    Component Value Date/Time   COLORURINE YELLOW 03/31/2019 [place] 03/31/2019 0137   LABSPEC 1.011 03/31/2019 0137   PHURINE 5.0 03/31/2019 0137   GLUCOSEU 150 (A) 03/31/2019 0137   HGBUR SMALL (A) 03/31/2019 0137   BILIRUBINUR NEGATIVE 03/31/2019 0137   KETONESUR NEGATIVE 03/31/2019 0137   PROTEINUR NEGATIVE 03/31/2019 0137   NITRITE NEGATIVE 03/31/2019 0137   LEUKOCYTESUR NEGATIVE 03/31/2019 0137   Sepsis Labs: Invalid input(s): PROCALCITONIN, [place]  Microbiology: Recent Results (from the past 240  hour(s))  Blood culture (routine x 2)     Status: None (Preliminary result)  Collection Time: 10/19/19  2:30 PM   Specimen: BLOOD  Result Value Ref Range Status   Specimen Description BLOOD LEFT ANTECUBITAL  Final   Special Requests   Final    BOTTLES DRAWN AEROBIC AND ANAEROBIC Blood Culture adequate volume   Culture   Final    NO GROWTH 3 DAYS Performed at [facility], [address], [place], [place] [address]    Report Status PENDING  Incomplete  Resp Panel by RT PCR (RSV, Flu A&B, Covid) - Nasopharyngeal Swab     Status: None   Collection Time: 10/19/19  4:11 PM   Specimen: Nasopharyngeal Swab  Result Value Ref Range Status   SARS Coronavirus 2 by RT PCR NEGATIVE NEGATIVE Final    Comment: (NOTE) SARS-CoV-2 target nucleic acids are NOT DETECTED.  The SARS-CoV-2 RNA is generally detectable in upper respiratoy specimens during the acute phase of infection. The lowest concentration of SARS-CoV-2 viral copies this assay can detect is 131 copies/mL. A negative result does not preclude SARS-Cov-2 infection and should not be used as the sole basis for treatment or other patient management decisions. A negative result may occur with  improper specimen collection/handling, submission of specimen other than nasopharyngeal swab, presence of viral mutation(s) within the areas targeted by this assay, and inadequate number of viral copies (<131 copies/mL). A negative result must be combined with clinical observations, patient history, and epidemiological information. The expected result is Negative.  Fact Sheet for Patients:  PinkCheek.be  Fact Sheet for Healthcare Providers:  GravelBags.it  This test is no t yet approved or cleared by the [place] FDA and  has been authorized for detection and/or diagnosis of SARS-CoV-2 by FDA under an Emergency Use Authorization (EUA). This EUA will remain  in effect (meaning  this test can be used) for the duration of the COVID-19 declaration under Section 564(b)(1) of the Act, 21 U.S.C. section 360bbb-3(b)(1), unless the authorization is terminated or revoked sooner.     Influenza A by PCR NEGATIVE NEGATIVE Final   Influenza B by PCR NEGATIVE NEGATIVE Final    Comment: (NOTE) The Xpert Xpress SARS-CoV-2/FLU/RSV assay is intended as an aid in  the diagnosis of influenza from Nasopharyngeal swab specimens and  should not be used as a sole basis for treatment. Nasal washings and  aspirates are unacceptable for Xpert Xpress SARS-CoV-2/FLU/RSV  testing.  Fact Sheet for Patients: PinkCheek.be  Fact Sheet for Healthcare Providers: GravelBags.it  This test is not yet approved or cleared by the [place] FDA and  has been authorized for detection and/or diagnosis of SARS-CoV-2 by  FDA under an Emergency Use Authorization (EUA). This EUA will remain  in effect (meaning this test can be used) for the duration of the  Covid-19 declaration under Section 564(b)(1) of the Act, 21  U.S.C. section 360bbb-3(b)(1), unless the authorization is  terminated or revoked.    Respiratory Syncytial Virus by PCR NEGATIVE NEGATIVE Final    Comment: (NOTE) Fact Sheet for Patients: PinkCheek.be  Fact Sheet for Healthcare Providers: GravelBags.it  This test is not yet approved or cleared by the [place] FDA and  has been authorized for detection and/or diagnosis of SARS-CoV-2 by  FDA under an Emergency Use Authorization (EUA). This EUA will remain  in effect (meaning this test can be used) for the duration of the  COVID-19 declaration under Section 564(b)(1) of the Act, 21 U.S.C.  section 360bbb-3(b)(1), unless the authorization is terminated or  revoked. Performed at [facility], [place] [address] [place], [place] [address]   MRSA PCR Screening      Status: None   Collection Time: 10/19/19 10:21 PM   Specimen: Nasal Mucosa; Nasopharyngeal  Result Value Ref Range Status   MRSA by PCR NEGATIVE NEGATIVE Final    Comment:        The GeneXpert MRSA Assay (FDA approved for NASAL specimens only), is one component of a comprehensive MRSA colonization surveillance program. It is not intended to diagnose MRSA infection nor to guide or monitor treatment for MRSA infections. Performed at [facility], [place] [address], [place], [place] [address]   Blood culture (routine x 2)     Status: None (Preliminary result)   Collection Time: 10/20/19 12:29 AM   Specimen: BLOOD  Result Value Ref Range Status   Specimen Description BLOOD RIGHT ANTECUBITAL  Final   Special Requests   Final    BOTTLES DRAWN AEROBIC AND ANAEROBIC Blood Culture adequate volume   Culture   Final    NO GROWTH 2 DAYS Performed at [facility], [place] [address], [place], [place] [address]    Report Status PENDING  Incomplete    Radiology Studies: DG Foot 2 Views Right  Result Date: 10/22/2019 CLINICAL DATA:  Right foot redness, pain and swelling. No known injury. EXAM: RIGHT FOOT - 2 VIEW COMPARISON:  None. FINDINGS: No acute bony or joint abnormality is identified. Scattered osteoarthritis appears worst at the first MTP joint, tibiotalar joint and tarsometatarsal joints. No bony destructive change or periosteal reaction. No soft tissue gas or radiopaque foreign body. IMPRESSION: Soft tissue swelling consistent with cellulitis or dependent change. Negative for evidence of osteomyelitis. Multifocal osteoarthritis. Electronically Signed   By: Inge Rise M.D.   On: 10/22/2019 09:44     Bethania Schlotzhauer T. [place]  If 7PM-7AM, please contact night-coverage [email or web address] 10/22/2019, 2:51 PM

## 2019-10-22 NOTE — Progress Notes (Signed)
Physical Therapy Treatment Patient Details Name: Jesus Cruz MRN: [id number] DOB: 1965/03/17 Today's Date: 10/22/2019    History of Present Illness Pt is 54 yo male with PMH including CVA, CAD, DVT/PE, DM, HTN, chronic pain, CKD, and morbid obesity.  Pt reports extensive hx with back including surgeries, fractures, MRSA infection, and nerve damage leading to decreased mobility and unable to dorsiflex on R side.  Pt presented with R LE pain - admitted with sepsis due to R LE cellulitis.    PT Comments    Patient is progressing towards his physical therapy goals. Session focused on transfers and B LE strengthening. Patient able to perform squat pivot transfer bed to chair and back with min guard and use of arms of chair and bed rails. Patient continues to be limited by edema, pain, generalized weakness, impaired functional mobility. Continue to recommend HHPT following discharge to maximize functional mobility. PT will continue to follow.     Follow Up Recommendations  Home health PT     Equipment Recommendations  None recommended by PT    Recommendations for Other Services       Precautions / Restrictions Precautions Precautions: Fall    Mobility  Bed Mobility Overal bed mobility: Needs Assistance Bed Mobility: Supine to Sit;Sit to Supine     Supine to sit: Modified independent (Device/Increase time);HOB elevated Sit to supine: Modified independent (Device/Increase time);HOB elevated      Transfers Overall transfer level: Needs assistance Equipment used:  (arm rest of chair and bed) Transfers: Sit to/from [name] Sit to Stand: Min guard   Squat pivot transfers: Min guard     General transfer comment: toward R side to chair; towards L side back to bed, min guard for safety  Ambulation/Gait                 Stairs             Wheelchair Mobility    Modified Rankin (Stroke Patients Only)       Balance Overall balance  assessment: Needs assistance Sitting-balance support: Feet supported;No upper extremity supported Sitting balance-Leahy Scale: Fair     Standing balance support: Bilateral upper extremity supported Standing balance-Leahy Scale: Poor Standing balance comment: required UE support                            Cognition Arousal/Alertness: Awake/alert Behavior During Therapy: WFL for tasks assessed/performed Overall Cognitive Status: Within Functional Limits for tasks assessed                                        Exercises General Exercises - Lower Extremity Ankle Circles/Pumps: AROM;Both;10 reps;Supine (R ankle DF limited due to previous injury) Straight Leg Raises: AROM;Both;10 reps;Supine    General Comments        Pertinent Vitals/Pain Pain Assessment: 0-10 Pain Score: 8  Pain Location: R leg Pain Descriptors / Indicators: Throbbing Pain Intervention(s): Limited activity within patient's tolerance;Monitored during session;Repositioned    Home Living                      Prior Function            PT Goals (current goals can now be found in the care plan section) Acute Rehab PT Goals Patient Stated Goal: decrease pain PT Goal Formulation: With patient Time  For Goal Achievement: 11/04/19 Potential to Achieve Goals: Good Progress towards PT goals: Progressing toward goals    Frequency    Min 3X/week      PT Plan Current plan remains appropriate    Co-evaluation              AM-PAC PT "6 Clicks" Mobility   Outcome Measure  Help needed turning from your back to your side while in a flat bed without using bedrails?: A Little Help needed moving from lying on your back to sitting on the side of a flat bed without using bedrails?: A Little Help needed moving to and from a bed to a chair (including a wheelchair)?: A Little Help needed standing up from a chair using your arms (e.g., wheelchair or bedside chair)?: A  Little Help needed to walk in hospital room?: A Lot Help needed climbing 3-5 steps with a railing? : Total 6 Click Score: 15    End of Session   Activity Tolerance: Patient limited by pain Patient left: in bed (leaving for x-rays) Nurse Communication: Mobility status PT Visit Diagnosis: Pain;Other abnormalities of gait and mobility (R26.89);Muscle weakness (generalized) (M62.81) Pain - Right/Left: Right Pain - part of body: Ankle and joints of foot     Time: 2446-2863 PT Time Calculation (min) (ACUTE ONLY): 15 min  Charges:  $Therapeutic Activity: 8-22 mins                     Jesus Cruz, PT, DPT Acute Rehabilitation Services Pager [phone] Office [phone]    Jesus Cruz 10/22/2019, 9:34 AM

## 2019-10-23 ENCOUNTER — Inpatient Hospital Stay (HOSPITAL_COMMUNITY): Payer: Medicare Other

## 2019-10-23 DIAGNOSIS — N1831 Chronic kidney disease, stage 3a: Secondary | ICD-10-CM | POA: Diagnosis not present

## 2019-10-23 DIAGNOSIS — A419 Sepsis, unspecified organism: Secondary | ICD-10-CM | POA: Diagnosis not present

## 2019-10-23 DIAGNOSIS — L03115 Cellulitis of right lower limb: Secondary | ICD-10-CM | POA: Diagnosis not present

## 2019-10-23 DIAGNOSIS — I2510 Atherosclerotic heart disease of native coronary artery without angina pectoris: Secondary | ICD-10-CM | POA: Diagnosis not present

## 2019-10-23 LAB — RENAL FUNCTION PANEL
Albumin: 2.4 g/dL — ABNORMAL LOW (ref 3.5–5.0)
Anion gap: 9 (ref 5–15)
BUN: 30 mg/dL — ABNORMAL HIGH (ref 6–20)
CO2: 23 mmol/L (ref 22–32)
Calcium: 9.3 mg/dL (ref 8.9–10.3)
Chloride: 104 mmol/L (ref 98–111)
Creatinine, Ser: 1.45 mg/dL — ABNORMAL HIGH (ref 0.61–1.24)
GFR, Estimated: 54 mL/min — ABNORMAL LOW (ref >60)
Glucose, Bld: 114 mg/dL — ABNORMAL HIGH (ref 70–99)
Phosphorus: 4.5 mg/dL (ref 2.5–4.6)
Potassium: 5.5 mmol/L — ABNORMAL HIGH (ref 3.5–5.1)
Sodium: 136 mmol/L (ref 135–145)

## 2019-10-23 LAB — CBC
HCT: 40.1 % (ref 39.0–52.0)
Hemoglobin: 12.8 g/dL — ABNORMAL LOW (ref 13.0–17.0)
MCH: 30.7 pg (ref 26.0–34.0)
MCHC: 31.9 g/dL (ref 30.0–36.0)
MCV: 96.2 fL (ref 80.0–100.0)
Platelets: 257 10*3/uL (ref 150–400)
RBC: 4.17 MIL/uL — ABNORMAL LOW (ref 4.22–5.81)
RDW: 14.2 % (ref 11.5–15.5)
WBC: 13.9 10*3/uL — ABNORMAL HIGH (ref 4.0–10.5)
nRBC: 0 % (ref 0.0–0.2)

## 2019-10-23 LAB — GLUCOSE, CAPILLARY
Glucose-Capillary: 86 mg/dL (ref 70–99)
Glucose-Capillary: 108 mg/dL — ABNORMAL HIGH (ref 70–99)
Glucose-Capillary: 95 mg/dL (ref 70–99)
Glucose-Capillary: 133 mg/dL — ABNORMAL HIGH (ref 70–99)

## 2019-10-23 LAB — MAGNESIUM: Magnesium: 1.9 mg/dL (ref 1.7–2.4)

## 2019-10-23 LAB — C-REACTIVE PROTEIN: CRP: 25.9 mg/dL — ABNORMAL HIGH (ref <1.0)

## 2019-10-23 LAB — SEDIMENTATION RATE: Sed Rate: 115 mm/hr — ABNORMAL HIGH (ref 0–16)

## 2019-10-23 MED ORDER — GADOBUTROL 1 MMOL/ML IV SOLN
10.0000 mL | Freq: Once | INTRAVENOUS | Status: AC | PRN
  Administered 2019-10-23: 10 mL via INTRAVENOUS

## 2019-10-23 MED ORDER — SODIUM ZIRCONIUM CYCLOSILICATE 10 G PO PACK
10.0000 g | PACK | Freq: Once | ORAL | Status: AC
  Administered 2019-10-23: 10 g via ORAL
  Filled 2019-10-23: qty 1

## 2019-10-23 NOTE — Progress Notes (Signed)
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  In ED, HR 118.  RR 24.  WBC 20.2.  K5.8. Cr 1.29 (baseline).  BUN 34.  Glucose 146. Trop 11> 12.  CXR without acute finding.  CTA chest negative for PE or significant finding but cxtensive three-vessel coronary artery calcifications.  COVID-19 and influenza PCR negative.  Cultures obtained.  Started on broad-spectrum antibiotics and admitted for RLE cellulitis.  Blood cultures NGTD.  Lower extremity Doppler negative for DVT.  Cellulitis improving but continues to have significant pain in right foot. He has some tophi at the base of his right great toe.  Started on colchicine.  MRI of right foot ordered  Subjective: Seen and examined earlier this morning.  No major events overnight or this morning.  Endorses 9/10 pain in his right foot.  No other complaints.  Objective: Vitals:   10/22/19 2015 10/23/19 0457 10/23/19 0500 10/23/19 0921  BP: 123/82 116/69  106/83  Pulse: 85 81  88  Resp: 16 17    Temp: 99.5 F (37.5 C) 99 F (37.2 C)  98.4 F (36.9 C)  TempSrc: Oral Oral  Oral  SpO2: 95% 93%    Weight:   (!) 141.1 kg   Height:        Intake/Output Summary (Last 24 hours) at 10/23/2019 1720 Last data filed at 10/23/2019 1000 Gross per 24 hour  Intake 1245 ml  Output 3450 ml  Net -2205 ml   Filed Weights   10/19/19 2200 10/22/19 0423 10/23/19 0500  Weight: (!) 145.5 kg (!) 144.5 kg (!) 141.1 kg    Examination:  GENERAL: No apparent distress.  Nontoxic. HEENT: MMM.  Vision and hearing grossly intact.  NECK: Supple.  No apparent JVD.  RESP: On room air.  No  IWOB.  Fair aeration bilaterally. CVS:  RRR. Heart sounds normal.  ABD/GI/GU: BS+. Abd soft, NTND.  MSK/EXT:  Moves extremities.  Some swelling in RLE. SKIN: Erythema about RLE and right foot (improved).  See picture below NEURO: Awake, alert and oriented appropriately.  No apparent focal neuro deficit. PSYCH: Calm. Normal affect.     On admission and today from left to right   Procedures:  None  Microbiology summarized: COVID-19 PCR negative. Influenza PCR negative. Blood cultures NGTD. MRSA PCR negative.  Assessment & Plan: Sepsis due to right lower extremity cellulitis: POA.  Met sepsis criteria with tachycardia, tachypnea, leukocytosis and RLE cellulitis on admission. No DVT on [place]. Blood cultures negative.  Uric acid within normal range but has some tophi at the base of his right great toe with persistent pain and erythema. No signs of compartment syndrome.  Right foot x-ray with soft tissue swelling consistent with cellulitis and multifocal osteoarthritis but no evidence of osteomyelitis or fluid collection.  Still with leukocytosis and significant pain.  CRP 18.7> 25.9.  ESR 115. -Vancomycin 10/12-10/14 -IV ceftriaxone 10/12>> 10/15 -Doxycycline 10/15> 10/21 -Continue colchicine for possible gout flareup although uric acid not high. -MRI right foot -Pain control as below. -Encourage leg elevation. -Trend leukocytosis. -Discontinue IV fluid  Controlled DM-2 with hyperglycemia: A1c 5.9%. Recent Labs  Lab 10/22/19 1149  10/22/19 1655 10/22/19 2151 10/23/19 0740 10/23/19 1143  GLUCAP 117* 101* 91 86 108*  -Continue current insulin regimen. -Continue statin.  History of CAD/STEMI-no chest pain.  High-sensitivity troponin and EKG without significant finding.  CTA chest revealed significant three-vessel calcification -Continue aspirin, statin, Plavix and metoprolol -Outpatient follow-up with cardiology  Essential hypertension: Normotensive. -Continue home  amlodipine, metoprolol and Cardura  CKD-3A: Stable. -Continue monitoring  Hyperkalemia: K5.8> 4.7> 5.5.  Resolved. -Lokelma 10g once -Continue monitoring  Non-anion gap metabolic acidosis: Due to IV fluid?  Resolved.  Normocytic anemia: H&H relatively stable. -Continue monitoring  Hyponatremia: Resolved.  Leukocytosis: Likely due to #1.  Remains slightly elevated. -Continue monitoring  Hyperlipidemia -Continue statin.  Debility/physical deconditioning -PT/OT consult  Chronic lower back pain/neuropathy/possible gout flareup -Continue home medications-MS Contin -Changed in Lyrica from 100 mg 3 times daily to 200 mg twice daily on 10/14 -Continue Norco and IV fentanyl as needed  Morbid obesity Body mass index is 48.72 kg/m.  -Encourage lifestyle change to lose weight.       DVT prophylaxis:  Subcu Lovenox  Code Status: Full code Family Communication: Patient and/or RN. Available if any question.  Status is: Inpatient  Remains inpatient appropriate because:Persistent severe electrolyte disturbances, Ongoing active pain requiring inpatient pain management, Ongoing diagnostic testing needed not appropriate for outpatient work up, IV treatments appropriate due to intensity of illness or inability to take PO and Inpatient level of care appropriate due to severity of illness   Dispo: The patient is from: Home              Anticipated d/c is to: Home              Anticipated d/c date is: 1 day              Patient currently is not medically stable to d/c.       Consultants:  None   Sch Meds:  Scheduled Meds: . allopurinol  400 mg Oral BID  . amLODipine  10 mg Oral QHS  . aspirin EC  81 mg Oral QHS  . atorvastatin  10 mg Oral QHS  . clopidogrel  75 mg Oral Daily  . colchicine  0.6 mg Oral Daily  . doxazosin  2 mg Oral QHS  . doxycycline  100 mg Oral Q12H  . enoxaparin (LOVENOX) injection  70 mg Subcutaneous Q24H  . insulin aspart  0-20 Units  Subcutaneous TID WC  . insulin aspart  0-5 Units Subcutaneous QHS  . metoprolol succinate  150 mg Oral Daily  . morphine  15 mg Oral Q12H  . pregabalin  200 mg Oral BID   Continuous Infusions:  PRN Meds:.acetaminophen **OR** acetaminophen, albuterol, fentaNYL (SUBLIMAZE) injection, HYDROcodone-acetaminophen, ondansetron **OR** ondansetron (ZOFRAN) IV, polyethylene glycol  Antimicrobials: Anti-infectives (From admission, onward)   Start     Dose/Rate Route Frequency Ordered Stop   10/22/19 1000  doxycycline (VIBRA-TABS) tablet 100 mg        100 mg Oral Every 12 hours 10/22/19 0801 10/28/19 2359   10/22/19 0000  doxycycline (VIBRA-TABS) 100 MG tablet       Note to Pharmacy: May dispense capsule if insurance prefers.   100 mg Oral Every 12 hours 10/22/19 0809     10/20/19 0600  vancomycin (VANCOCIN) IVPB 1000 mg/200 mL premix  Status:  Discontinued        1,000 mg 200 mL/hr over 60 Minutes Intravenous Every 12 hours 10/19/19 1750 10/21/19 1503   10/19/19 1800  cefTRIAXone (  ROCEPHIN) 2 g in sodium chloride 0.9 % 100 mL IVPB  Status:  Discontinued        2 g 200 mL/hr over 30 Minutes Intravenous Every 24 hours 10/19/19 1748 10/22/19 0801   10/19/19 1800  vancomycin (VANCOCIN) 2,500 mg in sodium chloride 0.9 % 500 mL IVPB        2,500 mg 250 mL/hr over 120 Minutes Intravenous  Once 10/19/19 1750 10/19/19 2056   10/19/19 1700  cefTRIAXone (ROCEPHIN) 2 g in sodium chloride 0.9 % 100 mL IVPB        2 g 200 mL/hr over 30 Minutes Intravenous  Once 10/19/19 1654 10/19/19 1750       I have personally reviewed the following labs and images: CBC: Recent Labs  Lab 10/19/19 1006 10/20/19 0029 10/21/19 0506 10/22/19 0010 10/23/19 0121  WBC 20.2* 17.4* 15.5* 13.7* 13.9*  NEUTROABS  --   --  12.4* 10.5*  --   HGB 14.0 13.0 12.8* 11.9* 12.8*  HCT 44.0 39.8 38.9* 37.7* 40.1  MCV 95.9 94.8 93.5 95.4 96.2  PLT 267 220 243 239 257   BMP &GFR Recent Labs  Lab 10/19/19 1745 10/20/19 0029  10/21/19 0506 10/22/19 0010 10/23/19 0121  NA 134* 134* 136 134* 136  K 5.4* 4.9 4.9 4.7 5.5*  CL 104 105 107 105 104  CO2 20* 20* 17* 20* 23  GLUCOSE 77 76 96 142* 114*  BUN 35* 37* 37* 30* 30*  CREATININE 1.24 1.36* 1.27* 1.25* 1.45*  CALCIUM 8.9 8.7* 8.9 8.5* 9.3  MG  --   --  1.9 1.7 1.9  PHOS  --   --  3.9 3.6 4.5   Estimated Creatinine Clearance: 79.2 mL/min (A) (by C-G formula based on SCr of 1.45 mg/dL (H)). Liver & Pancreas: Recent Labs  Lab 10/19/19 1745 10/20/19 0029 10/21/19 0506 10/22/19 0010 10/23/19 0121  AST 27 14*  --   --   --   ALT 17 16  --   --   --   ALKPHOS 93 73  --   --   --   BILITOT 1.2 0.8  --   --   --   PROT 6.6 6.1*  --   --   --   ALBUMIN 2.9* 2.6* 2.6* 2.3* 2.4*   No results for input(s): LIPASE, AMYLASE in the last 168 hours. No results for input(s): AMMONIA in the last 168 hours. Diabetic: No results for input(s): HGBA1C in the last 72 hours. Recent Labs  Lab 10/22/19 1149 10/22/19 1655 10/22/19 2151 10/23/19 0740 10/23/19 1143  GLUCAP 117* 101* 91 86 108*   Cardiac Enzymes: No results for input(s): CKTOTAL, CKMB, CKMBINDEX, TROPONINI in the last 168 hours. No results for input(s): PROBNP in the last 8760 hours. Coagulation Profile: No results for input(s): INR, PROTIME in the last 168 hours. Thyroid Function Tests: No results for input(s): TSH, T4TOTAL, FREET4, T3FREE, THYROIDAB in the last 72 hours. Lipid Profile: No results for input(s): CHOL, HDL, LDLCALC, TRIG, CHOLHDL, LDLDIRECT in the last 72 hours. Anemia Panel: No results for input(s): VITAMINB12, FOLATE, FERRITIN, TIBC, IRON, RETICCTPCT in the last 72 hours. Urine analysis:    Component Value Date/Time   COLORURINE YELLOW 03/31/2019 [place] 03/31/2019 0137   LABSPEC 1.011 03/31/2019 [place] 5.0 03/31/2019 0137   GLUCOSEU 150 (A) 03/31/2019 0137   HGBUR SMALL (A) 03/31/2019 [place] NEGATIVE 03/31/2019 [place] 03/31/2019 0137  PROTEINUR NEGATIVE 03/31/2019 0137   NITRITE NEGATIVE 03/31/2019 [place] 03/31/2019 0137   Sepsis Labs: Invalid input(s): PROCALCITONIN, S.N.P.J.  Microbiology: Recent Results (from the past 240 hour(s))  Blood culture (routine x 2)     Status: None (Preliminary result)   Collection Time: 10/19/19  2:30 PM   Specimen: BLOOD  Result Value Ref Range Status   Specimen Description BLOOD LEFT ANTECUBITAL  Final   Special Requests   Final    BOTTLES DRAWN AEROBIC AND ANAEROBIC Blood Culture adequate volume   Culture   Final    NO GROWTH 4 DAYS Performed at [facility], [address] [place], [place] [address]    Report Status PENDING  Incomplete  Resp Panel by RT PCR (RSV, Flu A&B, Covid) - Nasopharyngeal Swab     Status: None   Collection Time: 10/19/19  4:11 PM   Specimen: Nasopharyngeal Swab  Result Value Ref Range Status   SARS Coronavirus 2 by RT PCR NEGATIVE NEGATIVE Final    Comment: (NOTE) SARS-CoV-2 target nucleic acids are NOT DETECTED.  The SARS-CoV-2 RNA is generally detectable in upper respiratoy specimens during the acute phase of infection. The lowest concentration of SARS-CoV-2 viral copies this assay can detect is 131 copies/mL. A negative result does not preclude SARS-Cov-2 infection and should not be used as the sole basis for treatment or other patient management decisions. A negative result may occur with  improper specimen collection/handling, submission of specimen other than nasopharyngeal swab, presence of viral mutation(s) within the areas targeted by this assay, and inadequate number of viral copies (<131 copies/mL). A negative result must be combined with clinical observations, patient history, and epidemiological information. The expected result is Negative.  Fact Sheet for Patients:  PinkCheek.be  Fact Sheet for Healthcare Providers:    GravelBags.it  This test is no t yet approved or cleared by the [place] FDA and  has been authorized for detection and/or diagnosis of SARS-CoV-2 by FDA under an Emergency Use Authorization (EUA). This EUA will remain  in effect (meaning this test can be used) for the duration of the COVID-19 declaration under Section 564(b)(1) of the Act, 21 U.S.C. section 360bbb-3(b)(1), unless the authorization is terminated or revoked sooner.     Influenza A by PCR NEGATIVE NEGATIVE Final   Influenza B by PCR NEGATIVE NEGATIVE Final    Comment: (NOTE) The Xpert Xpress SARS-CoV-2/FLU/RSV assay is intended as an aid in  the diagnosis of influenza from Nasopharyngeal swab specimens and  should not be used as a sole basis for treatment. Nasal washings and  aspirates are unacceptable for Xpert Xpress SARS-CoV-2/FLU/RSV  testing.  Fact Sheet for Patients: PinkCheek.be  Fact Sheet for Healthcare Providers: GravelBags.it  This test is not yet approved or cleared by the [place] FDA and  has been authorized for detection and/or diagnosis of SARS-CoV-2 by  FDA under an Emergency Use Authorization (EUA). This EUA will remain  in effect (meaning this test can be used) for the duration of the  Covid-19 declaration under Section 564(b)(1) of the Act, 21  U.S.C. section 360bbb-3(b)(1), unless the authorization is  terminated or revoked.    Respiratory Syncytial Virus by PCR NEGATIVE NEGATIVE Final    Comment: (NOTE) Fact Sheet for Patients: PinkCheek.be  Fact Sheet for Healthcare Providers: GravelBags.it  This test is not yet approved or cleared by the [place] FDA and  has been authorized for detection and/or diagnosis of SARS-CoV-2 by  FDA  under an Emergency Use Authorization (EUA). This EUA will remain  in effect (meaning this test can be  used) for the duration of the  COVID-19 declaration under Section 564(b)(1) of the Act, 21 U.S.C.  section 360bbb-3(b)(1), unless the authorization is terminated or  revoked. Performed at [facility], [place] [address] [place], [place] [address]   MRSA PCR Screening     Status: None   Collection Time: 10/19/19 10:21 PM   Specimen: Nasal Mucosa; Nasopharyngeal  Result Value Ref Range Status   MRSA by PCR NEGATIVE NEGATIVE Final    Comment:        The GeneXpert MRSA Assay (FDA approved for NASAL specimens only), is one component of a comprehensive MRSA colonization surveillance program. It is not intended to diagnose MRSA infection nor to guide or monitor treatment for MRSA infections. Performed at [facility], [place] [address] [place], [place] [address]   Blood culture (routine x 2)     Status: None (Preliminary result)   Collection Time: 10/20/19 12:29 AM   Specimen: BLOOD  Result Value Ref Range Status   Specimen Description BLOOD RIGHT ANTECUBITAL  Final   Special Requests   Final    BOTTLES DRAWN AEROBIC AND ANAEROBIC Blood Culture adequate volume   Culture   Final    NO GROWTH 3 DAYS Performed at [facility], [place] [address] [place], [place] [address]    Report Status PENDING  Incomplete    Radiology Studies: No results found.   Taye T. [place]  If 7PM-7AM, please contact night-coverage [email or web address] 10/23/2019, 5:20 PM

## 2019-10-23 NOTE — Evaluation (Signed)
Occupational Therapy Evaluation Patient Details Name: Jesus Cruz MRN: [id number] DOB: 1965-04-20 Today's Date: 10/23/2019    History of Present Illness Pt is 54 yo male with PMH including CVA, CAD, DVT/PE, DM, HTN, chronic pain, CKD, and morbid obesity.  Pt reports extensive hx with back including surgeries, fractures, MRSA infection, and nerve damage leading to decreased mobility and unable to dorsiflex on R side.  Pt presented with R LE pain - admitted with sepsis due to R LE cellulitis.   Clinical Impression   PTA patient completing ADLs and IADLs from w/c level, assisting his mother with her nighttime care (has caregivers for 8hrs during the day).  Admitted for above and limited by problem list below, including pain, impaired balance and decreased tolerance to weight-bear through R LE. Patient currently requires min-mod assist for LB ADLs, setup for UB ADLs, min guard for squat pivot transfers simulated to recliner.  He demonstrates good safety and awareness to deficits.  Believe he will benefit from continued OT services while admitted and after dc (pending progress) to optimize independence and return to PLOF with ADls, IADLs.  Will follow.     Follow Up Recommendations  Home health OT;Supervision/Assistance - 24 hour    Equipment Recommendations  None recommended by OT    Recommendations for Other Services       Precautions / Restrictions Precautions Precautions: Fall Restrictions Weight Bearing Restrictions: No      Mobility Bed Mobility Overal bed mobility: Modified Independent             General bed mobility comments: HOB elevated but no assist required to transition to EOB  Transfers Overall transfer level: Needs assistance   Transfers: Squat Pivot Transfers     Squat pivot transfers: Min guard     General transfer comment: squat pivot towards L side into recliner using bed and chair arm rest, good safety but minimal weightbearing through R LE      Balance Overall balance assessment: Needs assistance Sitting-balance support: No upper extremity supported;Feet supported Sitting balance-Leahy Scale: Fair                                     ADL either performed or assessed with clinical judgement   ADL Overall ADL's : Needs assistance/impaired     Grooming: Set up;Sitting   Upper Body Bathing: Set up;Sitting   Lower Body Bathing: Moderate assistance;Sit to/from stand Lower Body Bathing Details (indicate cue type and reason): assist for R LE and peri area required Upper Body Dressing : Supervision/safety;Sitting   Lower Body Dressing: Moderate assistance;Sit to/from stand Lower Body Dressing Details (indicate cue type and reason): assist for R LE and clothing mgmt, min guard partial stand but relies on BUE support Toilet Transfer: Min Dietitian Details (indicate cue type and reason): simulated to recliner          Functional mobility during ADLs: Min guard General ADL Comments: pt limited by R LE pain and decreased weight bearing through R LE for ADls/TFS     Vision         Perception     Praxis      Pertinent Vitals/Pain Pain Assessment: 0-10 Pain Score: 9  Pain Location: R leg Pain Descriptors / Indicators: Throbbing Pain Intervention(s): Limited activity within patient's tolerance;Monitored during session;Repositioned;Patient requesting pain meds-RN notified     Hand Dominance Right   Extremity/Trunk Assessment Upper Extremity  Assessment Upper Extremity Assessment: Overall WFL for tasks assessed   Lower Extremity Assessment Lower Extremity Assessment: Defer to PT evaluation       Communication Communication Communication: No difficulties   Cognition Arousal/Alertness: Awake/alert Behavior During Therapy: WFL for tasks assessed/performed Overall Cognitive Status: Within Functional Limits for tasks assessed                                      General Comments       Exercises     Shoulder Instructions      Home Living Family/patient expects to be discharged to:: Private residence Living Arrangements: Parent (takes care of his mother ) Available Help at Discharge: Other (Comment) (mothers caregiver 8 hrs/day ) Type of Home: House Home Access: Ramped entrance     [place]: One level     Bathroom Shower/Tub: Occupational psychologist: Handicapped height     Home Equipment: Environmental consultant - 2 wheels;Wheelchair - manual;Crutches;Wheelchair - power;Grab bars - tub/shower;Grab bars - toilet;Adaptive equipment;Shower seat - built in [name]: Reacher;Sock aid Additional Comments: pt helps care for mother at night who is bedbound      Prior Functioning/Environment Level of Independence: Needs assistance  Gait / Transfers Assistance Needed: Mostly uses w/c at home, reports that when he goes to store he uses crutches to walk in and then leans on cart (goes to small stores - avoids [employer]) ADL's / Homemaking Assistance Needed: Independent with ADLs and IADLs (sitting); pt takes care of his 68 yo mother at night and he has a caregiver for her during day; pt drives and does grocery shopping            OT Problem List: Decreased strength;Decreased activity tolerance;Impaired balance (sitting and/or standing);Pain;Obesity;Increased edema      OT Treatment/Interventions: Self-care/ADL training;DME and/or AE instruction;Therapeutic activities;Patient/family education;Balance training;Energy conservation    OT Goals(Current goals can be found in the care plan section) Acute Rehab OT Goals Patient Stated Goal: decrease pain OT Goal Formulation: With patient Time For Goal Achievement: 11/06/19 Potential to Achieve Goals: Good  OT Frequency: Min 2X/week   Barriers to D/C:            Co-evaluation              AM-PAC OT "6 Clicks" Daily Activity     Outcome Measure Help from another person eating  meals?: None Help from another person taking care of personal grooming?: A Little Help from another person toileting, which includes using toliet, bedpan, or urinal?: A Lot Help from another person bathing (including washing, rinsing, drying)?: A Lot Help from another person to put on and taking off regular upper body clothing?: A Little Help from another person to put on and taking off regular lower body clothing?: A Lot 6 Click Score: 16   End of Session Nurse Communication: Mobility status;Patient requests pain meds  Activity Tolerance: Patient tolerated treatment well Patient left: in chair;with call bell/phone within reach  OT Visit Diagnosis: Other abnormalities of gait and mobility (R26.89);Pain Pain - Right/Left: Right Pain - part of body: Leg;Ankle and joints of foot                Time: 4431-5400 OT Time Calculation (min): 16 min Charges:  OT General Charges $OT Visit: 1 Visit OT Evaluation $OT Eval Moderate Complexity: [place], OT Acute Rehabilitation Services Pager [phone]  Office [place] 10/23/2019, 9:34 AM

## 2019-10-24 DIAGNOSIS — M109 Gout, unspecified: Secondary | ICD-10-CM

## 2019-10-24 DIAGNOSIS — I10 Essential (primary) hypertension: Secondary | ICD-10-CM | POA: Diagnosis not present

## 2019-10-24 DIAGNOSIS — R5381 Other malaise: Secondary | ICD-10-CM

## 2019-10-24 DIAGNOSIS — E875 Hyperkalemia: Secondary | ICD-10-CM | POA: Diagnosis not present

## 2019-10-24 DIAGNOSIS — G4733 Obstructive sleep apnea (adult) (pediatric): Secondary | ICD-10-CM

## 2019-10-24 DIAGNOSIS — L03115 Cellulitis of right lower limb: Secondary | ICD-10-CM | POA: Diagnosis not present

## 2019-10-24 DIAGNOSIS — I2510 Atherosclerotic heart disease of native coronary artery without angina pectoris: Secondary | ICD-10-CM | POA: Diagnosis not present

## 2019-10-24 LAB — RENAL FUNCTION PANEL
Albumin: 2.3 g/dL — ABNORMAL LOW (ref 3.5–5.0)
Anion gap: 9 (ref 5–15)
BUN: 32 mg/dL — ABNORMAL HIGH (ref 6–20)
CO2: 23 mmol/L (ref 22–32)
Calcium: 9 mg/dL (ref 8.9–10.3)
Chloride: 103 mmol/L (ref 98–111)
Creatinine, Ser: 1.33 mg/dL — ABNORMAL HIGH (ref 0.61–1.24)
GFR, Estimated: >60 mL/min (ref >60)
Glucose, Bld: 111 mg/dL — ABNORMAL HIGH (ref 70–99)
Phosphorus: 4.7 mg/dL — ABNORMAL HIGH (ref 2.5–4.6)
Potassium: 4.4 mmol/L (ref 3.5–5.1)
Sodium: 135 mmol/L (ref 135–145)

## 2019-10-24 LAB — CULTURE, BLOOD (ROUTINE X 2)
Culture: NO GROWTH
Special Requests: ADEQUATE

## 2019-10-24 LAB — CBC
HCT: 39.5 % (ref 39.0–52.0)
Hemoglobin: 12.8 g/dL — ABNORMAL LOW (ref 13.0–17.0)
MCH: 30.6 pg (ref 26.0–34.0)
MCHC: 32.4 g/dL (ref 30.0–36.0)
MCV: 94.5 fL (ref 80.0–100.0)
Platelets: 297 10*3/uL (ref 150–400)
RBC: 4.18 MIL/uL — ABNORMAL LOW (ref 4.22–5.81)
RDW: 14 % (ref 11.5–15.5)
WBC: 12.9 10*3/uL — ABNORMAL HIGH (ref 4.0–10.5)
nRBC: 0 % (ref 0.0–0.2)

## 2019-10-24 LAB — GLUCOSE, CAPILLARY
Glucose-Capillary: 109 mg/dL — ABNORMAL HIGH (ref 70–99)
Glucose-Capillary: 90 mg/dL (ref 70–99)

## 2019-10-24 LAB — MAGNESIUM: Magnesium: 1.8 mg/dL (ref 1.7–2.4)

## 2019-10-24 NOTE — Discharge Instructions (Signed)

## 2019-10-24 NOTE — Progress Notes (Signed)
Nsg Discharge Note  Admit Date:  10/19/2019 Discharge date: 10/24/2019   [name] to be D/C'd Patient/caregiver able to verbalize understanding.  Discharge Medication: Allergies as of 10/24/2019      Reactions   Other Nausea And Vomiting   Mayonnaise       Medication List    STOP taking these medications   predniSONE 10 MG tablet Commonly known as: DELTASONE     TAKE these medications   allopurinol 300 MG tablet Commonly known as: ZYLOPRIM Take 400 mg by mouth 2 (two) times daily.   amLODipine 10 MG tablet Commonly known as: NORVASC Take 10 mg by mouth at bedtime.   aspirin EC 81 MG tablet Take 81 mg by mouth at bedtime.   atorvastatin 10 MG tablet Commonly known as: LIPITOR Take 10 mg by mouth at bedtime.   blood glucose meter kit and supplies Dispense based on patient and insurance preference. Use up to four times daily as directed. (FOR ICD-10 E10.9, E11.9).   clopidogrel 75 MG tablet Commonly known as: PLAVIX Take 1 tablet (75 mg total) by mouth daily.   clotrimazole-betamethasone cream Commonly known as: LOTRISONE Apply 1 application topically in the morning and at bedtime.   colchicine 0.6 MG tablet Take 0.6 mg by mouth daily.   cyclobenzaprine 10 MG tablet Commonly known as: FLEXERIL Take 1 tablet (10 mg total) by mouth 3 (three) times daily as needed for muscle spasms. What changed: when to take this   doxazosin 2 MG tablet Commonly known as: CARDURA Take 2 mg by mouth at bedtime.   doxycycline 100 MG tablet Commonly known as: VIBRA-TABS Take 1 tablet (100 mg total) by mouth every 12 (twelve) hours.   glimepiride 4 MG tablet Commonly known as: AMARYL Take 1 tablet (4 mg total) by mouth daily with breakfast.   hydrALAZINE 25 MG tablet Commonly known as: APRESOLINE Take 1 tablet (25 mg total) by mouth 3 (three) times daily.   HYDROcodone-acetaminophen 5-325 MG tablet Commonly known as: NORCO/VICODIN Take 1 tablet by mouth 2  (two) times daily as needed for severe pain.   Insulin Pen Needle 31G X 5 MM Misc Use with Levemir flexpen   Lantus SoloStar 100 UNIT/ML Solostar Pen Generic drug: insulin glargine Inject 7 Units into the skin daily.   lisinopril 10 MG tablet Commonly known as: ZESTRIL Take 10 mg by mouth daily.   metoCLOPramide 5 MG tablet Commonly known as: REGLAN Take 1 tablet (5 mg total) by mouth 3 (three) times daily before meals.   metoprolol succinate 50 MG 24 hr tablet Commonly known as: TOPROL-XL Take 3 tablets (150 mg total) by mouth daily.   morphine 15 MG 12 hr tablet Commonly known as: MS CONTIN Take 15 mg by mouth 3 (three) times daily.   naloxone 4 MG/0.1ML Liqd nasal spray kit Commonly known as: NARCAN Place 0.4 mg into the nose once.   nitroGLYCERIN 0.4 MG SL tablet Commonly known as: NITROSTAT Place 0.4 mg under the tongue every 5 (five) minutes as needed for chest pain (max 3 doses).   nystatin powder Commonly known as: MYCOSTATIN/NYSTOP Apply 1 application topically daily.   pantoprazole 40 MG tablet Commonly known as: Protonix Take 1 tablet (40 mg total) by mouth 2 (two) times daily.   pregabalin 100 MG capsule Commonly known as: LYRICA Take 100 mg by mouth 3 (three) times daily.       Discharge Assessment: Vitals:   10/24/19 0445 10/24/19 1034  BP: 118/62 111/79  Pulse: 76 90  Resp: 18   Temp: 98.5 F (36.9 C) 97.8 F (36.6 C)  SpO2: 97% 95%   Skin clean, dry and intact without evidence of skin break down, no evidence of skin tears noted. IV catheter discontinued intact. Site without signs and symptoms of complications - no redness or edema noted at insertion site, patient denies c/o pain - only slight tenderness at site.  Dressing with slight pressure applied.  D/c Instructions-Education: Discharge instructions given to patient/family with verbalized understanding. D/c education completed with patient/family including follow up instructions,  medication list, d/c activities limitations if indicated, with other d/c instructions as indicated by MD - patient able to verbalize understanding, all questions fully answered. Patient instructed to return to ED, call 911, or call MD for any changes in condition.  Patient escorted via WC, and D/C home via private auto.  Muhoro, Dorothy Waithira, RN 10/24/2019 2:21 PM 

## 2019-10-24 NOTE — Discharge Summary (Signed)
Physician Discharge Summary  Jesus Cruz FMB:[id number] DOB: 01/25/1965 DOA: 10/19/2019  PCP: Charlotte Sanes, MD  Admit date: 10/19/2019 Discharge date: 10/24/2019  Admitted From: Home Disposition: Home  Recommendations for Outpatient Follow-up:  1. Follow ups as below. 2. Please obtain CBC with differential/BMP/Mag at follow up 3. 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  In ED, HR 118.  RR 24.  WBC 20.2.  K5.8. Cr 1.29 (baseline).  BUN 34.  Glucose 146. Trop 11> 12.  CXR without acute finding.  CTA chest negative for PE or significant finding but cxtensive three-vessel coronary artery calcifications.  COVID-19 and influenza PCR negative.  Cultures obtained.  Started on broad-spectrum antibiotics (vancomycin and ceftriaxone) and admitted for RLE cellulitis.   Inflammatory markers including CRP and ESR markedly elevated. Lower extremity Doppler negative for DVT. Blood cultures NGTD.  Antibiotics deescalated doxycycline.  Cellulitis improving but continued to have significant pain in right foot.  MRI of right foot obtained showed right ankle effusion with synovitis and low-level marrow edema in talus bone, subarticular portion of the calcaneus and distal tibia and fibula with other changes suggestive for severe gouty arthropathy vs osteomyelitis, and other changes consistent with cellulitis. Uric acid 5.3.  Started on colchicine.  Eventually pain and swelling improved, and discharged home on p.o. doxycycline, and home colchicine and allopurinol.  He was encouraged to follow-up with PCP and his  orthopedic surgeon in 1 to 2 weeks.  Evaluated by therapy who recommended home health PT/OT.  See individual problem list below for more on hospital course. Discharge Diagnoses:  Sepsis due to right lower extremity cellulitis: POA.  Met sepsis criteria with tachycardia, tachypnea, leukocytosis and RLE cellulitis on admission. No DVT on [place]. Blood cultures negative.  Uric acid within normal range but has some tophi at the base of his right great toe with persistent pain and erythema. No signs of compartment syndrome.  Right foot x-ray with soft tissue swelling consistent with cellulitis and multifocal osteoarthritis but no evidence of osteomyelitis or fluid collection. CRP 18.7> 25.9.  ESR 115.  MRI right foot suggestive for gouty arthropathy versus osteomyelitis.  See details of MRI below.  Cellulitis improved. -Vancomycin 10/12-10/14 -IV ceftriaxone 10/12>> 10/15 -Doxycycline 10/15> 10/21 -Continue colchicine and allopurinol. -Continue home pain medications -Encourage leg elevation. -Recommend outpatient follow-up with his PCP and orthopedic surgery in 1 to 2 weeks. -Recheck CBC with differential, CRP and ESR at follow-up.  Controlled DM-2 with hyperglycemia: A1c 5.9%.  No results for input(s): HGBA1C in the last 72 hours. Recent Labs  Lab 10/23/19 1143 10/23/19 1851 10/23/19 2044 10/24/19 0726 10/24/19 1205  GLUCAP 108* 95 133* 109* 90  -Discharged on home medications.  History of CAD/STEMI-no chest pain.  High-sensitivity troponin and EKG without significant finding.  CTA chest revealed significant three-vessel calcification -Continue aspirin, statin, Plavix and metoprolol -Outpatient follow-up with cardiology  Essential hypertension: Normotensive. -Continue home amlodipine, metoprolol and Cardura  CKD-3A: Stable. -Continue monitoring  Hyperkalemia: K5.8> 4.7> 5.5> 4.4.  Resolved. -Recheck BMP at follow-up.  Non-anion gap metabolic acidosis: Due to IV fluid?   Resolved.  Normocytic anemia: H&H relatively stable. -Continue monitoring  Hyponatremia:  Resolved.  Leukocytosis: Likely due to #1.  He was also on prednisone prior to admission.  Improved. -Recheck CBC with differential in 1 week  Hyperlipidemia -Continue statin.  Debility/physical deconditioning -Home health PT/OT  Chronic lower back pain/neuropathy/possible gout flareup -Continue home medications-MS Contin, Norco and Lyrica.  Morbid obesity Body mass index is 48.72 kg/m.  -Encourage lifestyle change to lose weight.          Discharge Exam: Vitals:   10/23/19 2044 10/24/19 0445  BP: (!) 105/57 118/62  Pulse: 80 76  Resp: 18 18  Temp: 98.7 F (37.1 C) 98.5 F (36.9 C)  SpO2: 95% 97%    GENERAL: No apparent distress.  Nontoxic. HEENT: MMM.  Vision and hearing grossly intact.  NECK: Supple.  No apparent JVD.  RESP:  No IWOB.  Fair aeration bilaterally. CVS:  RRR. Heart sounds normal.  ABD/GI/GU: Bowel sounds present. Soft. Non tender.  MSK/EXT:  Moves extremities. No apparent deformity.  Trace edema about right ankle SKIN: Slight residual edema about his right ankle.  See pictures below. Callus over the plantar aspect of 4th metatarsal NEURO: Awake, alert and oriented appropriately.  No apparent focal neuro deficit. PSYCH: Calm. Normal affect.   On admission      On discharge       Discharge Instructions  Discharge Instructions    Call MD for:  extreme fatigue   Complete by: As directed    Call MD for:  redness, tenderness, or signs of infection (pain, swelling, redness, odor or green/yellow discharge around incision site)   Complete by: As directed    Call MD for:  severe uncontrolled pain   Complete by: As directed    Call MD for:  temperature >100.4   Complete by: As directed    Diet - low sodium heart healthy   Complete by: As directed    Diet Carb Modified   Complete by: As directed    Discharge instructions   Complete by: As  directed    It has been a pleasure taking care of you!  You were hospitalized and treated for cellulitis (right leg infection).  Your MRI shows changes as a result of chronic gout.  We are discharging you more antibiotics to complete treatment course.  Continue taking the colchicine and allopurinol.  We also recommend elevating your legs whenever you are sitting. Please follow-up with your primary care doctor and orthopedic surgeon in 1 to 2 weeks.  Please review your new medication list and the directions on your medications before you take them.   Take care,   Increase activity slowly   Complete by: As directed      Allergies as of 10/24/2019      Reactions   Other Nausea And Vomiting   Mayonnaise       Medication List    STOP taking these medications   predniSONE 10 MG tablet Commonly known as: DELTASONE     TAKE these medications   allopurinol 300 MG tablet Commonly known as: ZYLOPRIM Take 400 mg by mouth 2 (two) times daily.   amLODipine 10 MG tablet Commonly known as: NORVASC Take 10 mg by mouth at bedtime.   aspirin EC 81 MG tablet Take 81 mg by mouth at bedtime.   atorvastatin 10 MG tablet Commonly known as: LIPITOR Take 10 mg by mouth at bedtime.   blood glucose meter kit and supplies Dispense based on patient and insurance preference. Use up to four times daily as directed. (FOR  ICD-10 E10.9, E11.9).   clopidogrel 75 MG tablet Commonly known as: PLAVIX Take 1 tablet (75 mg total) by mouth daily.   clotrimazole-betamethasone cream Commonly known as: LOTRISONE Apply 1 application topically in the morning and at bedtime.   colchicine 0.6 MG tablet Take 0.6 mg by mouth daily.   cyclobenzaprine 10 MG tablet Commonly known as: FLEXERIL Take 1 tablet (10 mg total) by mouth 3 (three) times daily as needed for muscle spasms. What changed: when to take this   doxazosin 2 MG tablet Commonly known as: CARDURA Take 2 mg by mouth at bedtime.   doxycycline  100 MG tablet Commonly known as: VIBRA-TABS Take 1 tablet (100 mg total) by mouth every 12 (twelve) hours.   glimepiride 4 MG tablet Commonly known as: AMARYL Take 1 tablet (4 mg total) by mouth daily with breakfast.   hydrALAZINE 25 MG tablet Commonly known as: APRESOLINE Take 1 tablet (25 mg total) by mouth 3 (three) times daily.   HYDROcodone-acetaminophen 5-325 MG tablet Commonly known as: NORCO/VICODIN Take 1 tablet by mouth 2 (two) times daily as needed for severe pain.   Insulin Pen Needle 31G X 5 MM Misc Use with Levemir flexpen   Lantus SoloStar 100 UNIT/ML Solostar Pen Generic drug: insulin glargine Inject 7 Units into the skin daily.   lisinopril 10 MG tablet Commonly known as: ZESTRIL Take 10 mg by mouth daily.   metoCLOPramide 5 MG tablet Commonly known as: REGLAN Take 1 tablet (5 mg total) by mouth 3 (three) times daily before meals.   metoprolol succinate 50 MG 24 hr tablet Commonly known as: TOPROL-XL Take 3 tablets (150 mg total) by mouth daily.   morphine 15 MG 12 hr tablet Commonly known as: MS CONTIN Take 15 mg by mouth 3 (three) times daily.   naloxone 4 MG/0.1ML Liqd nasal spray kit Commonly known as: NARCAN Place 0.4 mg into the nose once.   nitroGLYCERIN 0.4 MG SL tablet Commonly known as: NITROSTAT Place 0.4 mg under the tongue every 5 (five) minutes as needed for chest pain (max 3 doses).   nystatin powder Commonly known as: MYCOSTATIN/NYSTOP Apply 1 application topically daily.   pantoprazole 40 MG tablet Commonly known as: Protonix Take 1 tablet (40 mg total) by mouth 2 (two) times daily.   pregabalin 100 MG capsule Commonly known as: LYRICA Take 100 mg by mouth 3 (three) times daily.       Consultations:  None  Procedures/Studies:   DG Chest 2 View  Result Date: 10/19/2019 CLINICAL DATA:  Chest pain EXAM: CHEST - 2 VIEW COMPARISON:  04/02/2019 FINDINGS: Stable cardiomediastinal contours. Low lung volumes. No focal  airspace consolidation, pleural effusion, or pneumothorax. Unchanged positioning of thoracic spinal stimulator leads. IMPRESSION: Low lung volumes. No acute cardiopulmonary findings. Electronically Signed   By: Davina Poke D.O.   On: 10/19/2019 10:28   CT Angio Chest PE W/Cm &/Or Wo Cm  Result Date: 10/19/2019 CLINICAL DATA:  PE suspected, high pretest probability EXAM: CT ANGIOGRAPHY CHEST WITH CONTRAST TECHNIQUE: Multidetector CT imaging of the chest was performed using the standard protocol during bolus administration of intravenous contrast. Multiplanar CT image reconstructions and MIPs were obtained to evaluate the vascular anatomy. CONTRAST:  154mL OMNIPAQUE IOHEXOL 350 MG/ML SOLN COMPARISON:  Radiograph 10/19/2019, CT 08/21/2017 FINDINGS: Cardiovascular: Satisfactory opacification the pulmonary arteries to the segmental level. No pulmonary artery filling defects are identified. Central pulmonary arteries are top normal caliber. Cardiac size within normal limits. Extensive three-vessel coronary artery calcifications.  No pericardial effusion. Minimal atherosclerotic plaque within normal caliber thoracic aorta. Shared origin of the brachiocephalic and left common carotid arteries. Proximal great vessels are minimally calcified but otherwise unremarkable. Small pericaval lipoma (6/97). No other major venous abnormalities. Mediastinum/Nodes: Abundant mediastinal fat. No mediastinal fluid or gas. Normal thyroid gland and thoracic inlet. No acute abnormality of the esophagus. Scattered secretions seen within the trachea. No worrisome mediastinal, hilar or axillary adenopathy. Lungs/Pleura: Mosaic regions of attenuation particularly towards the lung bases could reflect small airways disease or air trapping versus atelectatic change accentuated by imaging during exhalation. Bandlike opacities present in the left lower lobe and lingula have an appearance most compatible with subsegmental atelectasis or  scarring. No focal consolidative opacity. No convincing features of edema. No pneumothorax or visible effusion. Abundant subpleural fat. Mild airways thickening and scattered secretions. Upper Abdomen: No acute abnormalities present in the visualized portions of the upper abdomen. Small accessory splenule. Musculoskeletal: Straightening of the normal thoracic kyphosis. Multilevel degenerative changes are present in the imaged portions of the spine. Spinal stimulator device terminating T6-T8. Scoliotic curvature of the thoracic spine is imaged is similar to comparison. No worrisome chest wall lesions. Review of the MIP images confirms the above findings. IMPRESSION: 1. No evidence of acute pulmonary embolism. 2. Mosaic regions of attenuation particularly towards the lung bases could reflect small airways disease or air trapping versus atelectatic change accentuated by imaging during exhalation with additional areas of dependent and subsegmental atelectasis and/or scarring. 3. Scattered secretions in the trachea and central airways, could correlate for features of acute or chronic bronchitis. 4. No other acute intrathoracic process. 5. Extensive three-vessel coronary artery calcifications. 6. Aortic Atherosclerosis (ICD10-I70.0). Electronically Signed   By: Lovena Le M.D.   On: 10/19/2019 16:29   MR FOOT RIGHT W WO CONTRAST  Result Date: 10/24/2019 CLINICAL DATA:  Right leg pain, swelling, and erythema for 3 days. EXAM: MRI OF THE RIGHT HINDFOOT WITHOUT AND WITH CONTRAST TECHNIQUE: Multiplanar, multisequence MR imaging of the ankle was performed before and after the administration of intravenous contrast. CONTRAST:  23m GADAVIST GADOBUTROL 1 MMOL/ML IV SOLN COMPARISON:  Foot radiographs from 10/22/2019 and MRI ankle from 03/19/2015 FINDINGS: Despite efforts by the technologist and patient, motion artifact is present on today's exam and could not be eliminated. This reduces exam sensitivity and specificity.  TENDONS Peroneal: Mild peroneus longus and peroneus brevis tenosynovitis. Posteromedial: Tibialis posterior tenosynovitis and distal tendinopathy. Flexor hallucis longus and flexor digitorum longus tenosynovitis. Anterior: Indistinct extensor hallucis longus tendon Achilles: Mild Achilles tendinopathy. Plantar Fascia: Mildly thickened medial band of the plantar fascia. LIGAMENTS Lateral: Thickened anterior inferior tibiofibular ligament. Amorphous in likely deficient anterior talofibular ligament. Poorly seen calcaneofibular ligament. Medial: Ill definition of parts of the deep tibiotalar part of the deltoid ligament. CARTILAGE Ankle Joint: Severe degenerative chondral thinning. Small tibiotalar joint effusion. Low-level subcortical marrow edema in the distal tibia and throughout the talus. Mildly accentuated enhancement of the synovium in the tibiotalar joint suggesting synovitis. Subtalar Joints/Sinus Tarsi: Subtalar joint effusion with synovitis. Low-level edema and enhancement throughout the talus and in the portions of the calcaneus near the subtalar joints. Associated spurring noted. Bones: Low-level endosteal edema and enhancement in the distal fibula. Notable spurring and loss of articular space favoring degenerative arthropathy in the midfoot and Lisfranc joint. Other: Diffuse subcutaneous edema in the ankle and tracking into the dorsum of the foot. Plantar to the base of the fifth metatarsal, a 4.2 by 2.9 by 1.3 cm lesion is present  with low T1 and T2 signal, and also with a component tracking along the lateral margin of the shaft of the fifth metatarsal. This appears to potentially be eroding into the base of the fourth metatarsal and also somewhat closely associated with the Lisfranc joint, raising the possibility of erosive arthropathy with soft tissue lesion. 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 Subcutaneous edema tracks in the dorsum of the  foot. There is notable edema without much enhancement tracking in the flexor digitorum brevis muscle. IMPRESSION: IMPRESSION 1. Tibiotalar and subtalar joint effusions with synovitis and low-level marrow edema in the distal tibia, distal fibula, throughout the talus, and in subarticular portions of the calcaneus. While sepsis of multiple joints along with osteomyelitis is difficult to exclude with certainty, I would favor the appearance to be due to severe arthropathy such as gout arthropathy given the multiple joint involvement, mild degree of marrow edema relative to the synovitis, and patient history. 2. 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 3. Diffuse subcutaneous edema in the ankle and tracking into the dorsum of the foot, including the flexor digitorum brevis muscle. This is nonspecific and could be due to cellulitis, myositis, or neurogenic edema. 4. Tibialis posterior tenosynovitis and distal tendinopathy. 5. Mild peroneus longus and peroneus brevis tenosynovitis. 6. Mild Achilles tendinopathy with mild thickening of the medial band of the plantar fascia. 7. Thickened anterior inferior tibiofibular ligament and likely deficient anterior talofibular ligament. 8. Ill definition of parts of the deep tibiotalar part of the deltoid ligament, likely from remote injury. 9. Severe degenerative arthropathy in the midfoot and Lisfranc joint. Electronically Signed   By: Van Clines M.D.   On: 10/24/2019 09:18   DG Foot 2 Views Right  Result Date: 10/22/2019 CLINICAL DATA:  Right foot redness, pain and swelling. No known injury. EXAM: RIGHT FOOT - 2 VIEW COMPARISON:  None. FINDINGS: No acute bony or joint abnormality is identified. Scattered osteoarthritis appears worst at the first MTP joint, tibiotalar joint and tarsometatarsal joints. No bony  destructive change or periosteal reaction. No soft tissue gas or radiopaque foreign body. IMPRESSION: Soft tissue swelling consistent with cellulitis or dependent change. Negative for evidence of osteomyelitis. Multifocal osteoarthritis. Electronically Signed   By: Inge Rise M.D.   On: 10/22/2019 09:44   VAS [place] LOWER EXTREMITY VENOUS (DVT) (ONLY [facility] & [facility] 7a-7p)  Result Date: 10/19/2019  Lower Venous DVTStudy Indications: Pain, Swelling, and Edema.  Risk Factors: Obesity. Performing Technologist: Griffin Basil RCT RDMS  Examination Guidelines: A complete evaluation includes B-mode imaging, spectral Doppler, color Doppler, and power Doppler as needed of all accessible portions of each vessel. Bilateral testing is considered an integral part of a complete examination. Limited examinations for reoccurring indications may be performed as noted. The reflux portion of the exam is performed with the patient in reverse Trendelenburg.  +---------+---------------+---------+-----------+----------+------------------+ RIGHT    CompressibilityPhasicitySpontaneityPropertiesThrombus Aging     +---------+---------------+---------+-----------+----------+------------------+ CFV      Full           Yes      Yes                                     +---------+---------------+---------+-----------+----------+------------------+ SFJ  Full                                                            +---------+---------------+---------+-----------+----------+------------------+ FV Prox  Full                                                            +---------+---------------+---------+-----------+----------+------------------+ FV Mid   Full                                                            +---------+---------------+---------+-----------+----------+------------------+ FV DistalFull                                                             +---------+---------------+---------+-----------+----------+------------------+ PFV      Full                                                            +---------+---------------+---------+-----------+----------+------------------+ POP      Full           Yes      Yes                                     +---------+---------------+---------+-----------+----------+------------------+ PTV                                                   Normal on color                                                          imaging            +---------+---------------+---------+-----------+----------+------------------+ PERO                                                  Normal on color  imaging            +---------+---------------+---------+-----------+----------+------------------+     Summary: RIGHT: - There is no evidence of deep vein thrombosis in the lower extremity.  - No cystic structure found in the popliteal fossa.  LEFT: - No evidence of common femoral vein obstruction.  *See table(s) above for measurements and observations. Electronically signed by Deitra Mayo MD on 10/19/2019 at 8:15:06 PM.    Final         The results of significant diagnostics from this hospitalization (including imaging, microbiology, ancillary and laboratory) are listed below for reference.     Microbiology: Recent Results (from the past 240 hour(s))  Blood culture (routine x 2)     Status: None   Collection Time: 10/19/19  2:30 PM   Specimen: BLOOD  Result Value Ref Range Status   Specimen Description BLOOD LEFT ANTECUBITAL  Final   Special Requests   Final    BOTTLES DRAWN AEROBIC AND ANAEROBIC Blood Culture adequate volume   Culture   Final    NO GROWTH 5 DAYS Performed at [facility], [address] [place], [place] [address]    Report Status 10/24/2019 FINAL  Final  Resp Panel by RT PCR (RSV, Flu A&B,  Covid) - Nasopharyngeal Swab     Status: None   Collection Time: 10/19/19  4:11 PM   Specimen: Nasopharyngeal Swab  Result Value Ref Range Status   SARS Coronavirus 2 by RT PCR NEGATIVE NEGATIVE Final    Comment: (NOTE) SARS-CoV-2 target nucleic acids are NOT DETECTED.  The SARS-CoV-2 RNA is generally detectable in upper respiratoy specimens during the acute phase of infection. The lowest concentration of SARS-CoV-2 viral copies this assay can detect is 131 copies/mL. A negative result does not preclude SARS-Cov-2 infection and should not be used as the sole basis for treatment or other patient management decisions. A negative result may occur with  improper specimen collection/handling, submission of specimen other than nasopharyngeal swab, presence of viral mutation(s) within the areas targeted by this assay, and inadequate number of viral copies (<131 copies/mL). A negative result must be combined with clinical observations, patient history, and epidemiological information. The expected result is Negative.  Fact Sheet for Patients:  PinkCheek.be  Fact Sheet for Healthcare Providers:  GravelBags.it  This test is no t yet approved or cleared by the [place] FDA and  has been authorized for detection and/or diagnosis of SARS-CoV-2 by FDA under an Emergency Use Authorization (EUA). This EUA will remain  in effect (meaning this test can be used) for the duration of the COVID-19 declaration under Section 564(b)(1) of the Act, 21 U.S.C. section 360bbb-3(b)(1), unless the authorization is terminated or revoked sooner.     Influenza A by PCR NEGATIVE NEGATIVE Final   Influenza B by PCR NEGATIVE NEGATIVE Final    Comment: (NOTE) The Xpert Xpress SARS-CoV-2/FLU/RSV assay is intended as an aid in  the diagnosis of influenza from Nasopharyngeal swab specimens and  should not be used as a sole basis for treatment. Nasal  washings and  aspirates are unacceptable for Xpert Xpress SARS-CoV-2/FLU/RSV  testing.  Fact Sheet for Patients: PinkCheek.be  Fact Sheet for Healthcare Providers: GravelBags.it  This test is not yet approved or cleared by the [place] FDA and  has been authorized for detection and/or diagnosis of SARS-CoV-2 by  FDA under an Emergency Use Authorization (EUA). This EUA will remain  in effect (meaning this test can be used) for the duration  of the  Covid-19 declaration under Section 564(b)(1) of the Act, 21  U.S.C. section 360bbb-3(b)(1), unless the authorization is  terminated or revoked.    Respiratory Syncytial Virus by PCR NEGATIVE NEGATIVE Final    Comment: (NOTE) Fact Sheet for Patients: PinkCheek.be  Fact Sheet for Healthcare Providers: GravelBags.it  This test is not yet approved or cleared by the [place] FDA and  has been authorized for detection and/or diagnosis of SARS-CoV-2 by  FDA under an Emergency Use Authorization (EUA). This EUA will remain  in effect (meaning this test can be used) for the duration of the  COVID-19 declaration under Section 564(b)(1) of the Act, 21 U.S.C.  section 360bbb-3(b)(1), unless the authorization is terminated or  revoked. Performed at [facility], [place] [address] [place], [place] [address]   MRSA PCR Screening     Status: None   Collection Time: 10/19/19 10:21 PM   Specimen: Nasal Mucosa; Nasopharyngeal  Result Value Ref Range Status   MRSA by PCR NEGATIVE NEGATIVE Final    Comment:        The GeneXpert MRSA Assay (FDA approved for NASAL specimens only), is one component of a comprehensive MRSA colonization surveillance program. It is not intended to diagnose MRSA infection nor to guide or monitor treatment for MRSA infections. Performed at [facility], [place] [address] [place],  [place] [address]   Blood culture (routine x 2)     Status: None (Preliminary result)   Collection Time: 10/20/19 12:29 AM   Specimen: BLOOD  Result Value Ref Range Status   Specimen Description BLOOD RIGHT ANTECUBITAL  Final   Special Requests   Final    BOTTLES DRAWN AEROBIC AND ANAEROBIC Blood Culture adequate volume   Culture   Final    NO GROWTH 4 DAYS Performed at [facility], [place] [address] [place], [place] [address]    Report Status PENDING  Incomplete     Labs: BNP (last 3 results) Recent Labs    03/30/19 1451 03/30/19 1813  BNP 88.7 130.8*   Basic Metabolic Panel: Recent Labs  Lab 10/20/19 0029 10/21/19 0506 10/22/19 0010 10/23/19 0121 10/24/19 0126 10/24/19 0133  NA 134* 136 134* 136 135  --   K 4.9 4.9 4.7 5.5* 4.4  --   CL 105 107 105 104 103  --   CO2 20* 17* 20* 23 23  --   GLUCOSE 76 96 142* 114* 111*  --   BUN 37* 37* 30* 30* 32*  --   CREATININE 1.36* 1.27* 1.25* 1.45* 1.33*  --   CALCIUM 8.7* 8.9 8.5* 9.3 9.0  --   MG  --  1.9 1.7 1.9  --  1.8  PHOS  --  3.9 3.6 4.5 4.7*  --    Liver Function Tests: Recent Labs  Lab 10/19/19 1745 10/19/19 1745 10/20/19 0029 10/21/19 0506 10/22/19 0010 10/23/19 0121 10/24/19 0126  AST 27  --  14*  --   --   --   --   ALT 17  --  16  --   --   --   --   ALKPHOS 93  --  73  --   --   --   --   BILITOT 1.2  --  0.8  --   --   --   --   PROT 6.6  --  6.1*  --   --   --   --   ALBUMIN  2.9*   < > 2.6* 2.6* 2.3* 2.4* 2.3*   < > = values in this interval not displayed.   No results for input(s): LIPASE, AMYLASE in the last 168 hours. No results for input(s): AMMONIA in the last 168 hours. CBC: Recent Labs  Lab 10/20/19 0029 10/21/19 0506 10/22/19 0010 10/23/19 0121 10/24/19 0133  WBC 17.4* 15.5* 13.7* 13.9* 12.9*  NEUTROABS  --  12.4* 10.5*  --   --   HGB 13.0 12.8* 11.9* 12.8* 12.8*  HCT 39.8 38.9* 37.7* 40.1 39.5  MCV 94.8 93.5 95.4 96.2 94.5  PLT 220 243 239 257 297   Cardiac Enzymes: No  results for input(s): CKTOTAL, CKMB, CKMBINDEX, TROPONINI in the last 168 hours. BNP: Invalid input(s): POCBNP CBG: Recent Labs  Lab 10/23/19 0740 10/23/19 1143 10/23/19 1851 10/23/19 2044 10/24/19 0726  GLUCAP 86 108* 95 133* 109*   D-Dimer No results for input(s): DDIMER in the last 72 hours. Hgb A1c No results for input(s): HGBA1C in the last 72 hours. Lipid Profile No results for input(s): CHOL, HDL, LDLCALC, TRIG, CHOLHDL, LDLDIRECT in the last 72 hours. Thyroid function studies No results for input(s): TSH, T4TOTAL, T3FREE, THYROIDAB in the last 72 hours.  Invalid input(s): FREET3 Anemia work up No results for input(s): VITAMINB12, FOLATE, FERRITIN, TIBC, IRON, RETICCTPCT in the last 72 hours. Urinalysis    Component Value Date/Time   COLORURINE YELLOW 03/31/2019 [place] 03/31/2019 0137   LABSPEC 1.011 03/31/2019 0137   PHURINE 5.0 03/31/2019 0137   GLUCOSEU 150 (A) 03/31/2019 0137   HGBUR SMALL (A) 03/31/2019 0137   BILIRUBINUR NEGATIVE 03/31/2019 0137   KETONESUR NEGATIVE 03/31/2019 0137   PROTEINUR NEGATIVE 03/31/2019 0137   NITRITE NEGATIVE 03/31/2019 0137   LEUKOCYTESUR NEGATIVE 03/31/2019 0137   Sepsis Labs Invalid input(s): PROCALCITONIN,  WBC,  LACTICIDVEN   Time coordinating discharge: 45 minutes  SIGNED:  Mercy Riding, MD  [facility] 10/24/2019, 10:24 AM  If 7PM-7AM, please contact night-coverage [email or web address]

## 2019-10-24 NOTE — Plan of Care (Signed)
Patient discharged to go home today  Problem: Clinical Measurements: Goal: Ability to avoid or minimize complications of infection will improve Outcome: Adequate for Discharge   Problem: Skin Integrity: Goal: Skin integrity will improve Outcome: Adequate for Discharge   Problem: Health Behavior/Discharge Planning: Goal: Ability to manage health-related needs will improve Outcome: Adequate for Discharge   Problem: Clinical Measurements: Goal: Ability to maintain clinical measurements within normal limits will improve Outcome: Adequate for Discharge Goal: Will remain free from infection Outcome: Adequate for Discharge Goal: Diagnostic test results will improve Outcome: Adequate for Discharge Goal: Respiratory complications will improve Outcome: Adequate for Discharge Goal: Cardiovascular complication will be avoided Outcome: Adequate for Discharge

## 2019-10-25 LAB — CULTURE, BLOOD (ROUTINE X 2)
Culture: NO GROWTH
Special Requests: ADEQUATE

## 2019-10-27 ENCOUNTER — Other Ambulatory Visit: Payer: Self-pay | Admitting: Student

## 2019-10-27 DIAGNOSIS — L03115 Cellulitis of right lower limb: Secondary | ICD-10-CM

## 2019-10-27 MED ORDER — DOXYCYCLINE HYCLATE 100 MG PO TABS: 100.0000 mg | ORAL_TABLET | Freq: Two times a day (BID) | ORAL | 0 refills | Status: DC

## 2019-10-27 NOTE — Progress Notes (Signed)
Learnt that pharmacy didn't receive his doxycycline prescription. Sent another script.

## 2020-01-18 ENCOUNTER — Other Ambulatory Visit: Payer: Self-pay | Admitting: Physician Assistant

## 2020-07-23 ENCOUNTER — Other Ambulatory Visit: Payer: Self-pay | Admitting: Physician Assistant

## 2020-10-05 ENCOUNTER — Other Ambulatory Visit: Payer: Self-pay | Admitting: Physician Assistant

## 2021-01-19 DIAGNOSIS — E11621 Type 2 diabetes mellitus with foot ulcer: Secondary | ICD-10-CM | POA: Diagnosis not present

## 2021-01-19 DIAGNOSIS — M109 Gout, unspecified: Secondary | ICD-10-CM | POA: Diagnosis not present

## 2021-01-20 DIAGNOSIS — E7849 Other hyperlipidemia: Secondary | ICD-10-CM | POA: Diagnosis not present

## 2021-01-20 DIAGNOSIS — I493 Ventricular premature depolarization: Secondary | ICD-10-CM | POA: Diagnosis not present

## 2021-01-20 DIAGNOSIS — I2510 Atherosclerotic heart disease of native coronary artery without angina pectoris: Secondary | ICD-10-CM | POA: Diagnosis not present

## 2021-01-20 DIAGNOSIS — I10 Essential (primary) hypertension: Secondary | ICD-10-CM | POA: Diagnosis not present

## 2021-01-21 DIAGNOSIS — I493 Ventricular premature depolarization: Secondary | ICD-10-CM | POA: Diagnosis not present

## 2021-01-21 DIAGNOSIS — E7849 Other hyperlipidemia: Secondary | ICD-10-CM | POA: Diagnosis not present

## 2021-01-21 DIAGNOSIS — I10 Essential (primary) hypertension: Secondary | ICD-10-CM | POA: Diagnosis not present

## 2021-01-21 DIAGNOSIS — I2510 Atherosclerotic heart disease of native coronary artery without angina pectoris: Secondary | ICD-10-CM | POA: Diagnosis not present

## 2022-03-13 DIAGNOSIS — I2510 Atherosclerotic heart disease of native coronary artery without angina pectoris: Secondary | ICD-10-CM

## 2022-03-13 DIAGNOSIS — E7849 Other hyperlipidemia: Secondary | ICD-10-CM

## 2022-03-13 DIAGNOSIS — R079 Chest pain, unspecified: Secondary | ICD-10-CM

## 2022-03-14 ENCOUNTER — Encounter (HOSPITAL_COMMUNITY)
Admission: EM | Disposition: A | Discharge status: Home / Self Care | Payer: Self-pay | Source: Ambulatory Visit | Attending: Cardiology

## 2022-03-14 ENCOUNTER — Inpatient Hospital Stay (HOSPITAL_COMMUNITY)
Admission: EM | Admit: 2022-03-14 | Discharge: 2022-03-16 | DRG: 322 | Disposition: A | Discharge status: Home / Self Care | Payer: Medicare Other | Source: Ambulatory Visit | Attending: Cardiology | Admitting: Cardiology

## 2022-03-14 ENCOUNTER — Other Ambulatory Visit: Payer: Self-pay

## 2022-03-14 DIAGNOSIS — E11621 Type 2 diabetes mellitus with foot ulcer: Secondary | ICD-10-CM | POA: Diagnosis present

## 2022-03-14 DIAGNOSIS — I2510 Atherosclerotic heart disease of native coronary artery without angina pectoris: Secondary | ICD-10-CM | POA: Diagnosis present

## 2022-03-14 DIAGNOSIS — G4730 Sleep apnea, unspecified: Secondary | ICD-10-CM | POA: Diagnosis present

## 2022-03-14 DIAGNOSIS — E118 Type 2 diabetes mellitus with unspecified complications: Secondary | ICD-10-CM | POA: Diagnosis not present

## 2022-03-14 DIAGNOSIS — Z7902 Long term (current) use of antithrombotics/antiplatelets: Secondary | ICD-10-CM | POA: Diagnosis not present

## 2022-03-14 DIAGNOSIS — Z86711 Personal history of pulmonary embolism: Secondary | ICD-10-CM

## 2022-03-14 DIAGNOSIS — I10 Essential (primary) hypertension: Secondary | ICD-10-CM | POA: Diagnosis present

## 2022-03-14 DIAGNOSIS — I129 Hypertensive chronic kidney disease with stage 1 through stage 4 chronic kidney disease, or unspecified chronic kidney disease: Secondary | ICD-10-CM | POA: Diagnosis present

## 2022-03-14 DIAGNOSIS — Z794 Long term (current) use of insulin: Secondary | ICD-10-CM | POA: Diagnosis not present

## 2022-03-14 DIAGNOSIS — Z79899 Other long term (current) drug therapy: Secondary | ICD-10-CM

## 2022-03-14 DIAGNOSIS — F17210 Nicotine dependence, cigarettes, uncomplicated: Secondary | ICD-10-CM | POA: Diagnosis present

## 2022-03-14 DIAGNOSIS — I252 Old myocardial infarction: Secondary | ICD-10-CM

## 2022-03-14 DIAGNOSIS — R079 Chest pain, unspecified: Secondary | ICD-10-CM | POA: Diagnosis not present

## 2022-03-14 DIAGNOSIS — I25118 Atherosclerotic heart disease of native coronary artery with other forms of angina pectoris: Secondary | ICD-10-CM | POA: Diagnosis not present

## 2022-03-14 DIAGNOSIS — L97529 Non-pressure chronic ulcer of other part of left foot with unspecified severity: Secondary | ICD-10-CM | POA: Diagnosis present

## 2022-03-14 DIAGNOSIS — Z955 Presence of coronary angioplasty implant and graft: Secondary | ICD-10-CM | POA: Diagnosis not present

## 2022-03-14 DIAGNOSIS — Z8673 Personal history of transient ischemic attack (TIA), and cerebral infarction without residual deficits: Secondary | ICD-10-CM | POA: Diagnosis not present

## 2022-03-14 DIAGNOSIS — Z993 Dependence on wheelchair: Secondary | ICD-10-CM | POA: Diagnosis not present

## 2022-03-14 DIAGNOSIS — Z7982 Long term (current) use of aspirin: Secondary | ICD-10-CM | POA: Diagnosis not present

## 2022-03-14 DIAGNOSIS — Z7984 Long term (current) use of oral hypoglycemic drugs: Secondary | ICD-10-CM | POA: Diagnosis not present

## 2022-03-14 DIAGNOSIS — E1122 Type 2 diabetes mellitus with diabetic chronic kidney disease: Secondary | ICD-10-CM | POA: Diagnosis present

## 2022-03-14 DIAGNOSIS — I214 Non-ST elevation (NSTEMI) myocardial infarction: Principal | ICD-10-CM

## 2022-03-14 DIAGNOSIS — E7849 Other hyperlipidemia: Secondary | ICD-10-CM | POA: Diagnosis not present

## 2022-03-14 DIAGNOSIS — E785 Hyperlipidemia, unspecified: Secondary | ICD-10-CM | POA: Diagnosis present

## 2022-03-14 DIAGNOSIS — N183 Chronic kidney disease, stage 3 unspecified: Secondary | ICD-10-CM | POA: Diagnosis present

## 2022-03-14 DIAGNOSIS — M109 Gout, unspecified: Secondary | ICD-10-CM | POA: Diagnosis present

## 2022-03-14 DIAGNOSIS — Z720 Tobacco use: Secondary | ICD-10-CM | POA: Diagnosis not present

## 2022-03-14 HISTORY — PX: CORONARY STENT INTERVENTION: CATH118234

## 2022-03-14 HISTORY — PX: LEFT HEART CATH AND CORONARY ANGIOGRAPHY: CATH118249

## 2022-03-14 LAB — GLUCOSE, CAPILLARY
Glucose-Capillary: 133 mg/dL — ABNORMAL HIGH (ref 70–99)
Glucose-Capillary: 170 mg/dL — ABNORMAL HIGH (ref 70–99)

## 2022-03-14 LAB — POCT ACTIVATED CLOTTING TIME: Activated Clotting Time: 260 seconds

## 2022-03-14 SURGERY — LEFT HEART CATH AND CORONARY ANGIOGRAPHY
Anesthesia: LOCAL

## 2022-03-14 MED ORDER — FENTANYL CITRATE (PF) 100 MCG/2ML IJ SOLN
INTRAMUSCULAR | Status: AC
  Filled 2022-03-14: qty 2

## 2022-03-14 MED ORDER — INSULIN ASPART 100 UNIT/ML IJ SOLN
0.0000 [IU] | Freq: Three times a day (TID) | INTRAMUSCULAR | Status: DC
  Administered 2022-03-14: 2 [IU] via SUBCUTANEOUS
  Administered 2022-03-15: 3 [IU] via SUBCUTANEOUS
  Administered 2022-03-15: 5 [IU] via SUBCUTANEOUS
  Administered 2022-03-15: 2 [IU] via SUBCUTANEOUS
  Administered 2022-03-16 (×2): 3 [IU] via SUBCUTANEOUS
  Administered 2022-03-16: 2 [IU] via SUBCUTANEOUS

## 2022-03-14 MED ORDER — TICAGRELOR 90 MG PO TABS
90.0000 mg | ORAL_TABLET | Freq: Two times a day (BID) | ORAL | Status: DC
  Administered 2022-03-14 – 2022-03-16 (×5): 90 mg via ORAL
  Filled 2022-03-14 (×5): qty 1

## 2022-03-14 MED ORDER — LISINOPRIL 10 MG PO TABS
10.0000 mg | ORAL_TABLET | Freq: Every day | ORAL | Status: DC
  Administered 2022-03-15 – 2022-03-16 (×2): 10 mg via ORAL
  Filled 2022-03-14 (×2): qty 1

## 2022-03-14 MED ORDER — VERAPAMIL HCL 2.5 MG/ML IV SOLN
INTRAVENOUS | Status: DC | PRN
  Administered 2022-03-14: 10 mL via INTRA_ARTERIAL

## 2022-03-14 MED ORDER — COLCHICINE 0.6 MG PO TABS
0.6000 mg | ORAL_TABLET | Freq: Every day | ORAL | Status: DC
  Administered 2022-03-15 – 2022-03-16 (×2): 0.6 mg via ORAL
  Filled 2022-03-14 (×2): qty 1

## 2022-03-14 MED ORDER — ACETAMINOPHEN 325 MG PO TABS
650.0000 mg | ORAL_TABLET | ORAL | Status: DC | PRN
  Administered 2022-03-15 – 2022-03-16 (×2): 650 mg via ORAL
  Filled 2022-03-14 (×2): qty 2

## 2022-03-14 MED ORDER — FENTANYL CITRATE (PF) 100 MCG/2ML IJ SOLN
INTRAMUSCULAR | Status: DC | PRN
  Administered 2022-03-14: 25 ug via INTRAVENOUS

## 2022-03-14 MED ORDER — LABETALOL HCL 5 MG/ML IV SOLN: 10.0000 mg | INTRAVENOUS | Status: AC | PRN

## 2022-03-14 MED ORDER — MIDAZOLAM HCL 2 MG/2ML IJ SOLN
INTRAMUSCULAR | Status: AC
  Filled 2022-03-14: qty 2

## 2022-03-14 MED ORDER — PREDNISOLONE 5 MG PO TABS
5.0000 mg | ORAL_TABLET | Freq: Every day | ORAL | Status: DC
  Administered 2022-03-15 – 2022-03-16 (×2): 5 mg via ORAL
  Filled 2022-03-14 (×2): qty 1

## 2022-03-14 MED ORDER — SODIUM CHLORIDE 0.9 % IV SOLN: INTRAVENOUS | Status: AC

## 2022-03-14 MED ORDER — VERAPAMIL HCL 2.5 MG/ML IV SOLN
INTRAVENOUS | Status: AC
  Filled 2022-03-14: qty 2

## 2022-03-14 MED ORDER — ORAL CARE MOUTH RINSE: 15.0000 mL | OROMUCOSAL | Status: DC | PRN

## 2022-03-14 MED ORDER — SODIUM CHLORIDE 0.9 % IV SOLN: 250.0000 mL | INTRAVENOUS | Status: DC | PRN

## 2022-03-14 MED ORDER — HEPARIN SODIUM (PORCINE) 1000 UNIT/ML IJ SOLN
INTRAMUSCULAR | Status: AC
  Filled 2022-03-14: qty 10

## 2022-03-14 MED ORDER — SODIUM CHLORIDE 0.9% FLUSH: 3.0000 mL | INTRAVENOUS | Status: DC | PRN

## 2022-03-14 MED ORDER — HYDRALAZINE HCL 20 MG/ML IJ SOLN: 10.0000 mg | INTRAMUSCULAR | Status: AC | PRN

## 2022-03-14 MED ORDER — MIDAZOLAM HCL 2 MG/2ML IJ SOLN
INTRAMUSCULAR | Status: DC | PRN
  Administered 2022-03-14: 1 mg via INTRAVENOUS

## 2022-03-14 MED ORDER — ASPIRIN 81 MG PO CHEW
81.0000 mg | CHEWABLE_TABLET | Freq: Every day | ORAL | Status: DC
  Administered 2022-03-15 – 2022-03-16 (×2): 81 mg via ORAL
  Filled 2022-03-14 (×2): qty 1

## 2022-03-14 MED ORDER — TICAGRELOR 90 MG PO TABS
ORAL_TABLET | ORAL | Status: AC
  Filled 2022-03-14: qty 1

## 2022-03-14 MED ORDER — TICAGRELOR 90 MG PO TABS
ORAL_TABLET | ORAL | Status: DC | PRN
  Administered 2022-03-14: 180 mg via ORAL

## 2022-03-14 MED ORDER — HEPARIN (PORCINE) IN NACL 1000-0.9 UT/500ML-% IV SOLN
INTRAVENOUS | Status: DC | PRN
  Administered 2022-03-14 (×2): 500 mL

## 2022-03-14 MED ORDER — MORPHINE SULFATE ER 15 MG PO TBCR
15.0000 mg | EXTENDED_RELEASE_TABLET | Freq: Two times a day (BID) | ORAL | Status: DC
  Administered 2022-03-14 – 2022-03-16 (×5): 15 mg via ORAL
  Filled 2022-03-14 (×5): qty 1

## 2022-03-14 MED ORDER — SODIUM CHLORIDE 0.9% FLUSH
3.0000 mL | Freq: Two times a day (BID) | INTRAVENOUS | Status: DC
  Administered 2022-03-15 – 2022-03-16 (×3): 3 mL via INTRAVENOUS

## 2022-03-14 MED ORDER — IOHEXOL 350 MG/ML SOLN
INTRAVENOUS | Status: DC | PRN
  Administered 2022-03-14: 140 mL

## 2022-03-14 MED ORDER — ATORVASTATIN CALCIUM 80 MG PO TABS
80.0000 mg | ORAL_TABLET | Freq: Every day | ORAL | Status: DC
  Administered 2022-03-14 – 2022-03-16 (×3): 80 mg via ORAL
  Filled 2022-03-14 (×3): qty 1

## 2022-03-14 MED ORDER — HYDROCODONE-ACETAMINOPHEN 5-325 MG PO TABS
1.0000 | ORAL_TABLET | Freq: Four times a day (QID) | ORAL | Status: DC | PRN
  Administered 2022-03-15 – 2022-03-16 (×2): 1 via ORAL
  Filled 2022-03-14 (×2): qty 1

## 2022-03-14 MED ORDER — LIDOCAINE HCL (PF) 1 % IJ SOLN
INTRAMUSCULAR | Status: AC
  Filled 2022-03-14: qty 30

## 2022-03-14 MED ORDER — LIDOCAINE HCL (PF) 1 % IJ SOLN
INTRAMUSCULAR | Status: DC | PRN
  Administered 2022-03-14: 2 mL

## 2022-03-14 MED ORDER — ONDANSETRON HCL 4 MG/2ML IJ SOLN: 4.0000 mg | Freq: Four times a day (QID) | INTRAMUSCULAR | Status: DC | PRN

## 2022-03-14 MED ORDER — HEPARIN SODIUM (PORCINE) 1000 UNIT/ML IJ SOLN
INTRAMUSCULAR | Status: DC | PRN
  Administered 2022-03-14: 7000 [IU] via INTRAVENOUS
  Administered 2022-03-14: 4000 [IU] via INTRAVENOUS
  Administered 2022-03-14: 7000 [IU] via INTRAVENOUS

## 2022-03-14 MED ORDER — TICAGRELOR 90 MG PO TABS
ORAL_TABLET | ORAL | Status: AC
  Filled 2022-03-14: qty 2

## 2022-03-14 SURGICAL SUPPLY — 22 items
BALLN EMERGE MR 2.0X12 (BALLOONS) ×1
BALLN ~~LOC~~ EMERGE MR 2.75X8 (BALLOONS) ×1
BALLOON EMERGE MR 2.0X12 (BALLOONS) IMPLANT
BALLOON ~~LOC~~ EMERGE MR 2.75X8 (BALLOONS) IMPLANT
CATH 5FR JL3.5 JR4 ANG PIG MP (CATHETERS) IMPLANT
CATH LAUNCHER 6FR AL1 (CATHETERS) IMPLANT
CATH VISTA GUIDE 6FR XBLAD3.5 (CATHETERS) IMPLANT
CATHETER LAUNCHER 6FR AL1 (CATHETERS) ×1
DEVICE RAD COMP TR BAND LRG (VASCULAR PRODUCTS) IMPLANT
GLIDESHEATH SLEND SS 6F .021 (SHEATH) IMPLANT
GUIDEWIRE INQWIRE 1.5J.035X260 (WIRE) IMPLANT
INQWIRE 1.5J .035X260CM (WIRE) ×1
KIT ENCORE 26 ADVANTAGE (KITS) IMPLANT
KIT HEART LEFT (KITS) ×1 IMPLANT
MAT PREVALON FULL STRYKER (MISCELLANEOUS) IMPLANT
PACK CARDIAC CATHETERIZATION (CUSTOM PROCEDURE TRAY) ×1 IMPLANT
SHEATH 6FR 85 DEST SLENDER (SHEATH) IMPLANT
STENT SYNERGY XD 2.50X16 (Permanent Stent) IMPLANT
SYNERGY XD 2.50X16 (Permanent Stent) ×1 IMPLANT
TRANSDUCER W/STOPCOCK (MISCELLANEOUS) ×1 IMPLANT
TUBING CIL FLEX 10 FLL-RA (TUBING) ×1 IMPLANT
WIRE COUGAR XT STRL 190CM (WIRE) IMPLANT

## 2022-03-14 NOTE — H&P (Signed)
Cardiology Admission History and Physical   Patient ID: Jesus Cruz MRN: [id number]; DOB: 08-08-1965   Admission date: 03/14/2022  PCP:  Charlotte Sanes, MD   [facility] Cardiologist:  Bettina Gavia (new)  Chief Complaint:  Chest pain/NSTEMI  History of Present Illness:   Mr. Jesus Cruz is a 57 yo male with history of CAD with prior stenting of the RCA, HTN, MGUS, obesity, sleep apnea, PE, CKD stage 3 admitted to [facility] with chest pain. EKG without ischemic changes. Troponin elevated. He was seen there by Dr. Bettina Gavia and transferred to [facility] today for cardiac cath. He has no chest pain at the time of arrival to [facility]. Creatinine 0.8 yesterday. Last cardiac cath in 2021 at [facility] with patent RCA stent, moderate mid RCA stenosis, mild intermediate branch/OM stenosis.    Past Medical History:  Diagnosis Date   CVA (cerebral vascular accident) ([place])    Gout    Hypertension    MGUS (monoclonal gammopathy of unknown significance)    Obesity    OSA on CPAP    Pulmonary embolus ([place]) 05/2012   During recovery from MI, completed Coumadin   Stage 3 chronic kidney disease (HCC)    STEMI (ST elevation myocardial infarction) ([place]) 04/2012   Integrity BMS RCA with minor left system disease    Past Surgical History:  Procedure Laterality Date   CORONARY ANGIOPLASTY WITH STENT PLACEMENT  04/2012   Integrity BMS RCA, minor L system dz   LEFT HEART CATH AND CORONARY ANGIOGRAPHY N/A 03/31/2019   Procedure: LEFT HEART CATH AND CORONARY ANGIOGRAPHY;  Surgeon: Leonie Man, MD;  Location: [facility];  Service: Cardiovascular;  Laterality: N/A;   RADIOLOGY WITH ANESTHESIA N/A 09/16/2013   Procedure: RADIOLOGY WITH ANESTHESIA;  Surgeon: Rob Hickman, MD;  Location: [facility];  Service: Radiology;  Laterality: N/A;     Medications Prior to Admission: Prior to Admission medications   Medication Sig Start Date End Date Taking? Authorizing Provider  allopurinol  (ZYLOPRIM) 300 MG tablet Take 400 mg by mouth 2 (two) times daily.     [provider]  amLODipine (NORVASC) 10 MG tablet Take 10 mg by mouth at bedtime.     [provider]  aspirin EC 81 MG tablet Take 81 mg by mouth at bedtime.    [provider]  atorvastatin (LIPITOR) 10 MG tablet Take 10 mg by mouth at bedtime.  03/09/19   [provider]  blood glucose meter kit and supplies Dispense based on patient and insurance preference. Use up to four times daily as directed. (FOR ICD-10 E10.9, E11.9). 04/07/19   [name], DO  clopidogrel (PLAVIX) 75 MG tablet Take 1 tablet (75 mg total) by mouth daily. 04/08/19   Mikhail, [name], DO  clotrimazole-betamethasone (LOTRISONE) cream Apply 1 application topically in the morning and at bedtime. 10/06/19   [provider]  colchicine 0.6 MG tablet Take 0.6 mg by mouth daily.     [provider]  cyclobenzaprine (FLEXERIL) 10 MG tablet Take 1 tablet (10 mg total) by mouth 3 (three) times daily as needed for muscle spasms. Patient taking differently: Take 10 mg by mouth in the morning and at bedtime.  04/07/19   Mikhail, [name], DO  doxazosin (CARDURA) 2 MG tablet Take 2 mg by mouth at bedtime. 04/03/19   [provider]  doxycycline (VIBRA-TABS) 100 MG tablet Take 1 tablet (100 mg total) by mouth every 12 (twelve) hours. 10/27/19   [name],  [name], MD  glimepiride (AMARYL) 4 MG tablet Take 1 tablet (4 mg total) by mouth daily with breakfast. 04/07/19   [name], DO  hydrALAZINE (APRESOLINE) 25 MG tablet Take 1 tablet (25 mg total) by mouth 3 (three) times daily. Please make overdue appt with Dr. [name] before anymore refills. Thank you 2nd attempt 10/05/20   [name], MD  HYDROcodone-acetaminophen (NORCO/VICODIN) 5-325 MG tablet Take 1 tablet by mouth 2 (two) times daily as needed for severe pain.  03/16/19   [provider]  Insulin Pen Needle 31G X 5 MM MISC Use with Levemir  flexpen 04/07/19   Mikhail, [name], DO  LANTUS SOLOSTAR 100 UNIT/ML Solostar Pen Inject 7 Units into the skin daily.  04/14/19   [provider]  lisinopril (ZESTRIL) 10 MG tablet Take 10 mg by mouth daily. 01/20/18   [provider]  metoCLOPramide (REGLAN) 5 MG tablet Take 1 tablet (5 mg total) by mouth 3 (three) times daily before meals. Patient not taking: Reported on 10/19/2019 04/07/19   [name], DO  metoprolol succinate (TOPROL-XL) 50 MG 24 hr tablet Take 3 tablets (150 mg total) by mouth daily. 04/08/19   Mikhail, [name], DO  morphine (MS CONTIN) 15 MG 12 hr tablet Take 15 mg by mouth 3 (three) times daily. 02/26/19   [provider]  naloxone [facility]) nasal spray 4 mg/0.1 mL Place 0.4 mg into the nose once.  11/26/17   [provider]  nitroGLYCERIN (NITROSTAT) 0.4 MG SL tablet Place 0.4 mg under the tongue every 5 (five) minutes as needed for chest pain (max 3 doses).     [provider]  nystatin (MYCOSTATIN/NYSTOP) powder Apply 1 application topically daily. 10/06/19   [provider]  pantoprazole (PROTONIX) 40 MG tablet Take 1 tablet (40 mg total) by mouth 2 (two) times daily. 04/07/19 10/19/19  Mikhail, [name], DO  pregabalin (LYRICA) 100 MG capsule Take 100 mg by mouth 3 (three) times daily.     [provider]     Allergies:    Allergies  Allergen Reactions   Other Nausea And Vomiting    Mayonnaise      Social History:   Social History   Socioeconomic History   Marital status: Widowed    Spouse name: Not on file   Number of children: Not on file   Years of education: Not on file   Highest education level: Not on file  Occupational History   Not on file  Tobacco Use   Smoking status: Some Days    Types: Cigarettes   Smokeless tobacco: Never  Vaping Use   Vaping Use: Never used  Substance and Sexual Activity   Alcohol use: No   Drug use: Never   Sexual activity: Never  Other Topics Concern    Not on file  Social History Narrative   Not on file   Social Determinants of Health   Financial Resource Strain: Not on file  Food Insecurity: Not on file  Transportation Needs: Not on file  Physical Activity: Not on file  Stress: Not on file  Social Connections: Not on file  Intimate Partner Violence: Not on file    Family History:   The patient's family history is not on file.    ROS:  Please see the history of present illness.  All other ROS reviewed and negative.     Physical Exam/Data:  There were no vitals filed for this visit. No intake or output data in  the 24 hours ending 03/14/22 1357    10/23/2019    5:00 AM 10/22/2019    4:23 AM 10/19/2019   10:00 PM  Last 3 Weights  Weight (lbs) 311 lb 1.1 oz 318 lb 9 oz 320 lb 12.3 oz  Weight (kg) 141.1 kg 144.5 kg 145.5 kg     There is no height or weight on file to calculate BMI.  General:  Well nourished, well developed, in no acute distress HEENT: normal Neck: no JVD Vascular: No carotid bruits; Distal pulses 2+ bilaterally   Cardiac:  normal S1, S2; RRR; no murmur  Lungs:  clear to auscultation bilaterally, no wheezing, rhonchi or rales  Abd: soft, nontender, no hepatomegaly  Ext: no LE edema Musculoskeletal:  No deformities, BUE and BLE strength normal and equal Skin: warm and dry  Neuro:  CNs 2-12 intact, no focal abnormalities noted Psych:  Normal affect    EKG:  The ECG that was done was personally reviewed and demonstrates sinus with inferior Q waves  Relevant CV Studies:   Laboratory Data:  High Sensitivity Troponin:  No results for input(s): "TROPONINIHS" in the last 720 hours.    ChemistryNo results for input(s): "NA", "K", "CL", "CO2", "GLUCOSE", "BUN", "CREATININE", "CALCIUM", "MG", "GFRNONAA", "GFRAA", "ANIONGAP" in the last 168 hours.  No results for input(s): "PROT", "ALBUMIN", "AST", "ALT", "ALKPHOS", "BILITOT" in the last 168 hours. Lipids No results for input(s): "CHOL", "TRIG", "HDL",  "LABVLDL", "LDLCALC", "CHOLHDL" in the last 168 hours. HematologyNo results for input(s): "WBC", "RBC", "HGB", "HCT", "MCV", "MCH", "MCHC", "RDW", "PLT" in the last 168 hours. Thyroid No results for input(s): "TSH", "FREET4" in the last 168 hours. BNPNo results for input(s): "BNP", "PROBNP" in the last 168 hours.  DDimer No results for input(s): "DDIMER" in the last 168 hours.   Radiology/Studies:  No results found.   Assessment and Plan:   CAD/NSTEMI: Pt admitted to [facility] with chest pain and ruled in for NSTEMI with elevated troponin. Chest pain free currently. Plan cardiac cath today. Further plans to follow after cath.  HTN: Will follow BP post cath HLD: continue statin DM: HgbA1C 11.1. Continue lantus and glimepiride. Will ask DM team to see him tomorrow. SSI Sleep apnea: Continue CPAP Left foot ulcer: Will ask wound care to see him Tobacco abuse: smoking cessation is recommended   Severity of Illness: The appropriate patient status for this patient is INPATIENT. Inpatient status is judged to be reasonable and necessary in order to provide the required intensity of service to ensure the patient's safety. The patient's presenting symptoms, physical exam findings, and initial radiographic and laboratory data in the context of their chronic comorbidities is felt to place them at high risk for further clinical deterioration. Furthermore, it is not anticipated that the patient will be medically stable for discharge from the hospital within 2 midnights of admission.   * I certify that at the point of admission it is my clinical judgment that the patient will require inpatient hospital care spanning beyond 2 midnights from the point of admission due to high intensity of service, high risk for further deterioration and high frequency of surveillance required.*   For questions or updates, please contact [place] Please consult [email or web address] for contact info under      Signed, Lauree Chandler, MD  03/14/2022 1:57 PM

## 2022-03-14 NOTE — Interval H&P Note (Signed)
History and Physical Interval Note:  03/14/2022 2:09 PM  Jesus Cruz  has presented today for surgery, with the diagnosis of chest pain.  The various methods of treatment have been discussed with the patient and family. After consideration of risks, benefits and other options for treatment, the patient has consented to  Procedure(s): LEFT HEART CATH AND CORONARY ANGIOGRAPHY (N/A) as a surgical intervention.  The patient's history has been reviewed, patient examined, no change in status, stable for surgery.  I have reviewed the patient's chart and labs.  Questions were answered to the patient's satisfaction.    Cath Lab Visit (complete for each Cath Lab visit)  Clinical Evaluation Leading to the Procedure:   ACS: Yes.    Non-ACS:    Anginal Classification: CCS IV  Anti-ischemic medical therapy: Maximal Therapy (2 or more classes of medications)  Non-Invasive Test Results: No non-invasive testing performed  Prior CABG: No previous CABG        Lauree Chandler

## 2022-03-15 ENCOUNTER — Other Ambulatory Visit (HOSPITAL_COMMUNITY): Payer: Self-pay

## 2022-03-15 ENCOUNTER — Inpatient Hospital Stay (HOSPITAL_COMMUNITY): Payer: Medicare Other

## 2022-03-15 ENCOUNTER — Encounter (HOSPITAL_COMMUNITY): Payer: Self-pay | Admitting: Cardiology

## 2022-03-15 DIAGNOSIS — I25118 Atherosclerotic heart disease of native coronary artery with other forms of angina pectoris: Secondary | ICD-10-CM | POA: Diagnosis not present

## 2022-03-15 DIAGNOSIS — I2510 Atherosclerotic heart disease of native coronary artery without angina pectoris: Secondary | ICD-10-CM

## 2022-03-15 DIAGNOSIS — I214 Non-ST elevation (NSTEMI) myocardial infarction: Secondary | ICD-10-CM | POA: Diagnosis not present

## 2022-03-15 DIAGNOSIS — E118 Type 2 diabetes mellitus with unspecified complications: Secondary | ICD-10-CM | POA: Diagnosis not present

## 2022-03-15 DIAGNOSIS — E785 Hyperlipidemia, unspecified: Secondary | ICD-10-CM | POA: Diagnosis not present

## 2022-03-15 DIAGNOSIS — Z720 Tobacco use: Secondary | ICD-10-CM

## 2022-03-15 LAB — BASIC METABOLIC PANEL
Anion gap: 6 (ref 5–15)
BUN: 13 mg/dL (ref 6–20)
CO2: 25 mmol/L (ref 22–32)
Calcium: 8.7 mg/dL — ABNORMAL LOW (ref 8.9–10.3)
Chloride: 107 mmol/L (ref 98–111)
Creatinine, Ser: 1.14 mg/dL (ref 0.61–1.24)
GFR, Estimated: >60 mL/min (ref >60)
Glucose, Bld: 129 mg/dL — ABNORMAL HIGH (ref 70–99)
Potassium: 4.5 mmol/L (ref 3.5–5.1)
Sodium: 138 mmol/L (ref 135–145)

## 2022-03-15 LAB — CBC
HCT: 39.4 % (ref 39.0–52.0)
Hemoglobin: 13.1 g/dL (ref 13.0–17.0)
MCH: 31 pg (ref 26.0–34.0)
MCHC: 33.2 g/dL (ref 30.0–36.0)
MCV: 93.4 fL (ref 80.0–100.0)
Platelets: 198 10*3/uL (ref 150–400)
RBC: 4.22 MIL/uL (ref 4.22–5.81)
RDW: 12.5 % (ref 11.5–15.5)
WBC: 6.3 10*3/uL (ref 4.0–10.5)
nRBC: 0 % (ref 0.0–0.2)

## 2022-03-15 LAB — GLUCOSE, CAPILLARY
Glucose-Capillary: 136 mg/dL — ABNORMAL HIGH (ref 70–99)
Glucose-Capillary: 223 mg/dL — ABNORMAL HIGH (ref 70–99)
Glucose-Capillary: 190 mg/dL — ABNORMAL HIGH (ref 70–99)
Glucose-Capillary: 173 mg/dL — ABNORMAL HIGH (ref 70–99)

## 2022-03-15 LAB — ECHOCARDIOGRAM COMPLETE
AR max vel: 4.39 cm2
AV Area VTI: 4.51 cm2
AV Area mean vel: 4.42 cm2
AV Mean grad: 2 mmHg
AV Peak grad: 4 mmHg
Ao pk vel: 0.99 m/s
Area-P 1/2: 4.12 cm2
Height: 67 in
S' Lateral: 3.5 cm
Weight: 4977.1 oz

## 2022-03-15 LAB — TROPONIN I (HIGH SENSITIVITY): Troponin I (High Sensitivity): 4913 ng/L (ref <18)

## 2022-03-15 LAB — LDL CHOLESTEROL, DIRECT: Direct LDL: 76 mg/dL (ref 0–99)

## 2022-03-15 LAB — HEMOGLOBIN A1C
Hgb A1c MFr Bld: 11.4 % — ABNORMAL HIGH (ref 4.8–5.6)
Mean Plasma Glucose: 280 mg/dL

## 2022-03-15 MED ORDER — PERFLUTREN LIPID MICROSPHERE
1.0000 mL | INTRAVENOUS | Status: AC | PRN
  Administered 2022-03-15: 2 mL via INTRAVENOUS

## 2022-03-15 MED ORDER — METOPROLOL SUCCINATE ER 25 MG PO TB24
12.5000 mg | ORAL_TABLET | Freq: Every day | ORAL | Status: DC
  Administered 2022-03-15 – 2022-03-16 (×2): 12.5 mg via ORAL
  Filled 2022-03-15 (×2): qty 1

## 2022-03-15 NOTE — Progress Notes (Signed)
Demonstrated upper body exercises for pt, pt reports that he has cycle ergometer that he will use to exercise.   Jesus Cruz 03/15/2022 3:17 PM

## 2022-03-15 NOTE — Progress Notes (Addendum)
Rounding Note    Patient Name: Jesus Cruz Date of Encounter: 03/15/2022  [place] Cardiologist: None   Subjective   Patient is overall doing well, he has generalized weakness. He denies chest pain or SOB. Cath site is stable.   Inpatient Medications    Scheduled Meds:  aspirin  81 mg Oral Daily   atorvastatin  80 mg Oral QHS   colchicine  0.6 mg Oral Daily   insulin aspart  0-15 Units Subcutaneous TID WC   lisinopril  10 mg Oral Daily   morphine  15 mg Oral Q12H   prednisoLONE  5 mg Oral Daily   sodium chloride flush  3 mL Intravenous Q12H   ticagrelor  90 mg Oral BID   Continuous Infusions:  sodium chloride     PRN Meds: sodium chloride, acetaminophen, HYDROcodone-acetaminophen, ondansetron (ZOFRAN) IV, mouth rinse, sodium chloride flush   Vital Signs    Vitals:   03/14/22 2009 03/15/22 0025 03/15/22 0607 03/15/22 0823  BP: (!) 107/56 127/81 115/86 115/86  Pulse: 95 96 92 93  Resp: '16 18 16 18  '$ Temp: 98.9 F (37.2 C) 98.4 F (36.9 C) 97.9 F (36.6 C) 98.1 F (36.7 C)  TempSrc: Oral Oral Oral Oral  SpO2: 96% 96% 97% 94%  Weight:      Height:        Intake/Output Summary (Last 24 hours) at 03/15/2022 0851 Last data filed at 03/15/2022 0700 Gross per 24 hour  Intake 675.12 ml  Output 1400 ml  Net -724.88 ml      03/14/2022    7:15 PM 10/23/2019    5:00 AM 10/22/2019    4:23 AM  Last 3 Weights  Weight (lbs) 311 lb 1.1 oz 311 lb 1.1 oz 318 lb 9 oz  Weight (kg) 141.1 kg 141.1 kg 144.5 kg      Telemetry    NSR HR 90s - Personally Reviewed  ECG    NSR 84bpm, TWI aVL - Personally Reviewed  Physical Exam   GEN: No acute distress.   Neck: No JVD Cardiac: RRR, no murmurs, rubs, or gallops.  Respiratory: Clear to auscultation bilaterally. GI: Soft, nontender, non-distended  MS: No edema; No deformity. Neuro:  Nonfocal  Psych: Normal affect   Labs    High Sensitivity Troponin:  No results for input(s): "TROPONINIHS" in the  last 720 hours.   Chemistry Recent Labs  Lab 03/15/22 0245  NA 138  K 4.5  CL 107  CO2 25  GLUCOSE 129*  BUN 13  CREATININE 1.14  CALCIUM 8.7*  GFRNONAA >60  ANIONGAP 6    Lipids No results for input(s): "CHOL", "TRIG", "HDL", "LABVLDL", "LDLCALC", "CHOLHDL" in the last 168 hours.  Hematology Recent Labs  Lab 03/15/22 0245  WBC 6.3  RBC 4.22  HGB 13.1  HCT 39.4  MCV 93.4  MCH 31.0  MCHC 33.2  RDW 12.5  PLT 198    Lipid Panel     Component Value Date/Time   CHOL 118 04/01/2019 0305   TRIG 193 (H) 04/01/2019 0305   HDL 27 (L) 04/01/2019 0305   CHOLHDL 4.4 04/01/2019 0305   VLDL 39 04/01/2019 0305   LDLCALC 52 04/01/2019 0305   LDLDIRECT 76 03/15/2022 0245    Thyroid No results for input(s): "TSH", "FREET4" in the last 168 hours.  BNPNo results for input(s): "BNP", "PROBNP" in the last 168 hours.  DDimer No results for input(s): "DDIMER" in the last 168 hours.   Radiology  CARDIAC CATHETERIZATION  Result Date: 03/14/2022   Prox RCA lesion is 30% stenosed.   1st Diag lesion is 40% stenosed.   1st Mrg lesion is 60% stenosed.   RPDA lesion is 100% stenosed.   Ramus lesion is 99% stenosed.   Mid RCA lesion is 60% stenosed.   Prox RCA to Mid RCA lesion is 20% stenosed.   A drug-eluting stent was successfully placed using a SYNERGY XD 2.50X16.   Post intervention, there is a 0% residual stenosis. The left main is a very large vessel. There are 4 branches that arise from the left main. Mild non-obstructive plaque in the LAD. The first intermediate branch has mild non-obstructive disease. The other intermediate branch has a severe mid vessel stenosis. (Felt to be the culprit vessel). The Circumflex has mild to moderate mid vessel stenosis. The obtuse marginal branch has moderate stenosis, unchanged from last cath. The RCA is a large dominant vessel with a moderate proximal stenosis, patent mid vessel stented segment with minimal restenosis. The mid RCA beyond the stent has a  focal, eccentric calcified stenosis that is unchanged from last cath. This does not appear to be flow limiting. Chronic occlusion of the PDA Successful PTCA/DES x 1 intermediate branch. Recommendations: DAPT with ASA and Brilinta for one year. High intensity statin. Add back home medications as BP tolerates. We will ask the DM management team to see him to discuss his home diabetes regimen. Echo tomorrow to assess LV function.    Cardiac Studies   Cardiac cath 03/14/22   Prox RCA lesion is 30% stenosed.   1st Diag lesion is 40% stenosed.   1st Mrg lesion is 60% stenosed.   RPDA lesion is 100% stenosed.   Ramus lesion is 99% stenosed.   Mid RCA lesion is 60% stenosed.   Prox RCA to Mid RCA lesion is 20% stenosed.   A drug-eluting stent was successfully placed using a SYNERGY XD 2.50X16.   Post intervention, there is a 0% residual stenosis.   The left main is a very large vessel. There are 4 branches that arise from the left main. Mild non-obstructive plaque in the LAD. The first intermediate branch has mild non-obstructive disease. The other intermediate branch has a severe mid vessel stenosis. (Felt to be the culprit vessel).  The Circumflex has mild to moderate mid vessel stenosis. The obtuse marginal branch has moderate stenosis, unchanged from last cath.  The RCA is a large dominant vessel with a moderate proximal stenosis, patent mid vessel stented segment with minimal restenosis. The mid RCA beyond the stent has a focal, eccentric calcified stenosis that is unchanged from last cath. This does not appear to be flow limiting. Chronic occlusion of the PDA Successful PTCA/DES x 1 intermediate branch.    Recommendations: DAPT with ASA and Brilinta for one year. High intensity statin. Add back home medications as BP tolerates. We will ask the DM management team to see him to discuss his home diabetes regimen. Echo tomorrow to assess LV function.   Coronary Diagrams  Diagnostic Dominance:  Co-dominant  Intervention     Echo pending  Patient Profile     57 y.o. male with history of CAD with prior stenting of the RCA, HTN, MGUS, obesity, sleep apnea, PE, CKD stage 3 who presented from [facility] with NSTEMI.  Assessment & Plan    NSTEMI CAD - admitted to [facility] and ruled in for NSTEMI with elevated troponin - chest pain free - cardiac cath with stent  to PTCA/DES x 1 to the intermediate branch - DAPT with ASA and Brilinta for 1 year - Lipitor '80mg'$  daily - start Toprol 12.'5mg'$  BID - cath site stable - echo pending - cardiac rehab in the room during interview - ambulate patient as able, reported possible wheelchair bound at home  HTN - lisinopril '10mg'$  daily - start Toprol 12.'5mg'$  daily - Bps good  HLD - LDL 76 - continue Lipitor '80mg'$  daily  DM2 -A1C 11.4 - followed by PCP as outpatient - will consult diabetic coordinator  Left foot ulcer - wound care consult  Tobacco use - cessation recommended  For questions or updates, please contact [place] Please consult [email or web address] for contact info under        Signed, Cadence Ninfa Meeker, PA-C  03/15/2022, 8:51 AM      Patient seen and examined. Agree with assessment and plan.  Cine angiograms personally reviewed.  Patient underwent successful DES stenting to her second ramus intermediate vessel with total stenosis being reduced to 0%.  Plan for medical therapy with concomitant CAD.  Patient is weak. Blood pressure 120/86.  Heart rate increased in the mid 90s.  Initiate beta-blocker therapy today with 12.5 mg but may be able to increase to 25 mg daily.  For 2D echo Doppler study today.  Patient cannot walk well lifting his right leg.  Check follow-up troponin since no troponin was checked here at [facility].  He is improved diabetes mellitus management with hemoglobin A1c elevated at 11.4.  LDL cholesterol 76, continue high potency statin therapy, may need addition of Zetia to reach LDL  less than 55 particularly with his underlying diabetes mellitus.  Check LP(a).  Discussed the importance of smoking cessation.  Continue DAPT for minimum of 1 year and possibly longer.  Possible discharge tomorrow if stable.  Troy Sine, MD, [facility] 03/15/2022 11:14 AM

## 2022-03-15 NOTE — Inpatient Diabetes Management (Signed)
Inpatient Diabetes Program Recommendations  AACE/ADA: New Consensus Statement on Inpatient Glycemic Control (2015)  Target Ranges:  Prepandial:   less than 140 mg/dL      Peak postprandial:   less than 180 mg/dL (1-2 hours)      Critically ill patients:  140 - 180 mg/dL   Lab Results  Component Value Date   GLUCAP 223 (H) 03/15/2022   HGBA1C 11.4 (H) 03/14/2022    Review of Glycemic Control  Diabetes history: type 2 Outpatient Diabetes medications: Lantus 9 units daily, Amaryl 4 mg at HS Current orders for Inpatient glycemic control: Novolog 0-15 units correction scale TID  Inpatient Diabetes Program Recommendations:   Spoke with patient at the bedside. Patient was diagnosed 3-4 years ago. States that he uses a POGO automatic monitor to test his blood sugars. Discussed his HgbA1C of 11.4% which he said was higher than it has been. States that he checks blood sugars at least 3 times per day.   Patient lives alone with 5 dogs. He does his own cooking. Insulin dosage was increased recently due to higher A1C levels. States that he is able to get his medications without any financial issues.   Will continue to monitor blood sugars while in the hospital.  Harvel Ricks RN BSN CDE Diabetes Coordinator Pager: [phone]  8am-5pm

## 2022-03-15 NOTE — Consult Note (Signed)
Reason for Consult: Left foot wound Referring Physician: Dr. Burman Foster Jesus Cruz Ketterer is an 57 y.o. male.  HPI: Patient is a 57 year old male with PMH of HTN, HLD, tobacco use disorder, CAD with previous stenting of RCA and most recent cardiac cath 2021, pulmonary embolism, gouty arthritis, and IDDM with recent hemoglobin A1c 11.1 who was admitted to the hospital 03/14/2022 for NSTEMI.  Plastic surgery consulted given full-thickness left foot ulcer left foot for which he is followed by Dr. Welton Flakes at wound care center.  Wound care nurse has already been consulted on this patient and will transition from outpatient collagen to once daily silver Hydrofiber.  On exam, patient reports that he has had this wound at base of first MTP left foot x 17 months.  He reports that it has been exacerbated at times due to acute gout flares.  He reports that he has nearly weekly debridements performed at the wound care center and that it has improved tremendously over the course of the past year.  He endorses some discomfort over the plantar aspect of the foot, but denies any severe pain at the wound site.  He has been treating the wound with collagen dressings.  He reports that he is predominantly wheelchair-bound at home.  He also reports that his bilateral lower extremity edema is worse than normal.   Past Medical History:  Diagnosis Date   CVA (cerebral vascular accident) ([place])    Gout    Hypertension    MGUS (monoclonal gammopathy of unknown significance)    Obesity    OSA on CPAP    Pulmonary embolus ([place]) 05/2012   During recovery from MI, completed Coumadin   Stage 3 chronic kidney disease ([place])    STEMI (ST elevation myocardial infarction) ([place]) 04/2012   Integrity BMS RCA with minor left system disease    Past Surgical History:  Procedure Laterality Date   CORONARY ANGIOPLASTY WITH STENT PLACEMENT  04/2012   Integrity BMS RCA, minor L system dz   CORONARY STENT INTERVENTION N/A 03/14/2022    Procedure: CORONARY STENT INTERVENTION;  Surgeon: Burnell Blanks, MD;  Location: [facility];  Service: Cardiovascular;  Laterality: N/A;   LEFT HEART CATH AND CORONARY ANGIOGRAPHY N/A 03/31/2019   Procedure: LEFT HEART CATH AND CORONARY ANGIOGRAPHY;  Surgeon: Leonie Man, MD;  Location: [facility];  Service: Cardiovascular;  Laterality: N/A;   LEFT HEART CATH AND CORONARY ANGIOGRAPHY N/A 03/14/2022   Procedure: LEFT HEART CATH AND CORONARY ANGIOGRAPHY;  Surgeon: Burnell Blanks, MD;  Location: [facility];  Service: Cardiovascular;  Laterality: N/A;   RADIOLOGY WITH ANESTHESIA N/A 09/16/2013   Procedure: RADIOLOGY WITH ANESTHESIA;  Surgeon: Rob Hickman, MD;  Location: [facility];  Service: Radiology;  Laterality: N/A;    History reviewed. No pertinent family history.  Social History:  reports that he has been smoking cigarettes. He has never used smokeless tobacco. He reports that he does not drink alcohol and does not use drugs.  Allergies:  Allergies  Allergen Reactions   Other Nausea And Vomiting    Mayonnaise      Medications: I have reviewed the patient's current medications.  Results for orders placed or performed during the hospital encounter of 03/14/22 (from the past 48 hour(s))  POCT Activated clotting time     Status: None   Collection Time: 03/14/22  2:55 PM  Result Value Ref Range   Activated Clotting Time 260 seconds    Comment:  Reference range 74-137 seconds for patients not on anticoagulant therapy.  Glucose, capillary     Status: Abnormal   Collection Time: 03/14/22  4:04 PM  Result Value Ref Range   Glucose-Capillary 133 (H) 70 - 99 mg/dL    Comment: Glucose reference range applies only to samples taken after fasting for at least 8 hours.  Hemoglobin A1c     Status: Abnormal   Collection Time: 03/14/22  4:30 PM  Result Value Ref Range   Hgb A1c MFr Bld 11.4 (H) 4.8 - 5.6 %    Comment: (NOTE)         Prediabetes: 5.7  - 6.4         Diabetes: >6.4         Glycemic control for adults with diabetes: <7.0    Mean Plasma Glucose 280 mg/dL    Comment: (NOTE) Performed At: [facility] [place], [place] [id number] Rush Farmer MD [id number]   Glucose, capillary     Status: Abnormal   Collection Time: 03/14/22  9:53 PM  Result Value Ref Range   Glucose-Capillary 170 (H) 70 - 99 mg/dL    Comment: Glucose reference range applies only to samples taken after fasting for at least 8 hours.  Basic metabolic panel     Status: Abnormal   Collection Time: 03/15/22  2:45 AM  Result Value Ref Range   Sodium 138 135 - 145 mmol/L   Potassium 4.5 3.5 - 5.1 mmol/L   Chloride 107 98 - 111 mmol/L   CO2 25 22 - 32 mmol/L   Glucose, Bld 129 (H) 70 - 99 mg/dL    Comment: Glucose reference range applies only to samples taken after fasting for at least 8 hours.   BUN 13 6 - 20 mg/dL   Creatinine, Ser 1.14 0.61 - 1.24 mg/dL   Calcium 8.7 (L) 8.9 - 10.3 mg/dL   GFR, Estimated >60 >60 mL/min    Comment: (NOTE) Calculated using the CKD-EPI Creatinine Equation (2021)    Anion gap 6 5 - 15    Comment: Performed at [facility] [address] [place] [address]  CBC     Status: None   Collection Time: 03/15/22  2:45 AM  Result Value Ref Range   WBC 6.3 4.0 - 10.5 K/uL   RBC 4.22 4.22 - 5.81 MIL/uL   Hemoglobin 13.1 13.0 - 17.0 g/dL   HCT 39.4 39.0 - 52.0 %   MCV 93.4 80.0 - 100.0 fL   MCH 31.0 26.0 - 34.0 pg   MCHC 33.2 30.0 - 36.0 g/dL   RDW 12.5 11.5 - 15.5 %   Platelets 198 150 - 400 K/uL   nRBC 0.0 0.0 - 0.2 %    Comment: Performed at [facility], [place] [address] [place], [place] [address]  LDL cholesterol, direct     Status: None   Collection Time: 03/15/22  2:45 AM  Result Value Ref Range   Direct LDL 76 0 - 99 mg/dL    Comment: Performed at [facility] [address], [place], [place] [address]  Glucose, capillary     Status: Abnormal   Collection  Time: 03/15/22  8:20 AM  Result Value Ref Range   Glucose-Capillary 136 (H) 70 - 99 mg/dL    Comment: Glucose reference range applies only to samples taken after fasting for at least 8 hours.  Glucose, capillary     Status: Abnormal  Collection Time: 03/15/22 11:30 AM  Result Value Ref Range   Glucose-Capillary 223 (H) 70 - 99 mg/dL    Comment: Glucose reference range applies only to samples taken after fasting for at least 8 hours.    CARDIAC CATHETERIZATION  Result Date: 03/14/2022   Prox RCA lesion is 30% stenosed.   1st Diag lesion is 40% stenosed.   1st Mrg lesion is 60% stenosed.   RPDA lesion is 100% stenosed.   Ramus lesion is 99% stenosed.   Mid RCA lesion is 60% stenosed.   Prox RCA to Mid RCA lesion is 20% stenosed.   A drug-eluting stent was successfully placed using a SYNERGY XD 2.50X16.   Post intervention, there is a 0% residual stenosis. The left main is a very large vessel. There are 4 branches that arise from the left main. Mild non-obstructive plaque in the LAD. The first intermediate branch has mild non-obstructive disease. The other intermediate branch has a severe mid vessel stenosis. (Felt to be the culprit vessel). The Circumflex has mild to moderate mid vessel stenosis. The obtuse marginal branch has moderate stenosis, unchanged from last cath. The RCA is a large dominant vessel with a moderate proximal stenosis, patent mid vessel stented segment with minimal restenosis. The mid RCA beyond the stent has a focal, eccentric calcified stenosis that is unchanged from last cath. This does not appear to be flow limiting. Chronic occlusion of the PDA Successful PTCA/DES x 1 intermediate branch. Recommendations: DAPT with ASA and Brilinta for one year. High intensity statin. Add back home medications as BP tolerates. We will ask the DM management team to see him to discuss his home diabetes regimen. Echo tomorrow to assess LV function.    ROS Denies any severe pain, malodor, or  considerable drainage.   Blood pressure (!) 148/87, pulse (!) 101, temperature 98 F (36.7 C), temperature source Oral, resp. rate 16, height '5\' 7"'$  (1.702 m), weight (!) 141.1 kg, SpO2 95 %. Physical Exam Constitutional:      General: He is not in acute distress.    Appearance: Normal appearance. He is not toxic-appearing.  Pulmonary:     Effort: Pulmonary effort is normal.  Musculoskeletal:     Right lower leg: Edema present.     Left lower leg: Edema present.  Skin:    Findings: Lesion present.     Comments: Approximately 3 x 3 x 0.25 cm wound at medial aspect of first MTP joint left foot.  Mild hardened exudate overlying otherwise good base of granular tissue.  No malodor or drainage.  Surrounding skin is dry appearing.  No surrounding induration, crepitus, fluctuance, or concerning erythema.  Good capillary refill.  Neurological:     Mental Status: He is alert and oriented to person, place, and time.        Assessment/Plan:  Chronic left foot wound: -Foot is seems well-perfused and there is no malodor, purulence, fluctuance, crepitus, or cellulitic changes concerning for infection. -Reports that he has had plain films and MRI in the past without findings for osteomyelitis.  Cannot locate in chart.  Regardless, lower suspicion at this time. -Do not feel as though or debridement necessary at this time, but will confer with surgeon. -Continued dressing changes per recommendations of wound care team. -Elevate leg when possible to help with swelling.  Picture(s) obtained of the patient and placed in the chart were with the patient's or guardian's permission.   Krista Blue 03/15/2022, 11:45 AM

## 2022-03-15 NOTE — Consult Note (Signed)
Jesus Cruz Nurse Consult Note: Reason for Consult:left foot ulcer, full thickness.Patient is followed by Dr. Welton Flakes at the outpatient wound care center in [facility] for this wound. Last seen on 02/25/22. Collagen dressings are used; we do not use collagen dressings in house and will substitute a silver hydrofiber will admitted. Patient will return to the previous care plan upon discharge and follow up with Dr. Welton Flakes as directed. Wound type:neuropathic Pressure Injury POA: N/A Measurement:3cm x 2.5cm with depth obscured by the presence of yellow fibrinous tissue Wound bed:As noted above, 95% yellow fibrinous tissue, 5 % red Drainage (amount, consistency, odor) small to moderate Periwound: intact Dressing procedure/placement/frequency: As noted above, the POC in place at the outpatient wound care center has been effective per Dr. Vivi Barrack last note on 02/25/22. I have provided guidance for Nursing for the use of once daily silver hydrofiber. Heels are to be floated.  [facility] nursing team will not follow, but will remain available to this patient, the nursing and medical teams.  Please re-consult if needed.  Thank you for inviting [place] to participate in this patient's Plan of Care.  Maudie Flakes, MSN, RN, CNS, [place], Serita Grammes, [employer], [employer] phone:  [phone]

## 2022-03-15 NOTE — TOC Benefit Eligibility Note (Signed)
Patient Teacher, English as a foreign language completed.    The patient is currently admitted and upon discharge could be taking Brilinta 90 mg.  The current 30 day co-pay is $45.00.   The patient is currently admitted and upon discharge could be taking Jardiance 10 mg.  The current 30 day co-pay is $45.00.   The patient is insured through [place], [place] Patient Advocate Specialist [facility] Patient Advocate Team Direct Number: [phone]  Fax: [phone]

## 2022-03-15 NOTE — Progress Notes (Signed)
Critical trop of [id number]. Notified cardiology PA, Cadence Furth. No orders received.

## 2022-03-15 NOTE — Progress Notes (Addendum)
Pt was educated NSTEMI, on stent location, Birlinta and ASA use, wt restrictions, no baths/daily wash-ups, s/s of infection, ex guidelines (light actvity, upper body movement), s/s to stop exercising, NTG use and calling 911, heart healthy diet, diabetic diet, risk factors (smoking, High A1C), and CRPII. Pt received MI book and materials on exercise, smoking diet, and CRPII. Will refer to [facility].   Pt uses the WC to mobilize around the house and crutches when moving outside.   Pt was counseled heavily on ways to manage diabetes, ways to exercise, and CRPII.   [phone]

## 2022-03-16 DIAGNOSIS — E11621 Type 2 diabetes mellitus with foot ulcer: Secondary | ICD-10-CM | POA: Insufficient documentation

## 2022-03-16 DIAGNOSIS — E785 Hyperlipidemia, unspecified: Secondary | ICD-10-CM | POA: Insufficient documentation

## 2022-03-16 DIAGNOSIS — I214 Non-ST elevation (NSTEMI) myocardial infarction: Secondary | ICD-10-CM | POA: Diagnosis not present

## 2022-03-16 DIAGNOSIS — E118 Type 2 diabetes mellitus with unspecified complications: Secondary | ICD-10-CM | POA: Insufficient documentation

## 2022-03-16 LAB — GLUCOSE, CAPILLARY
Glucose-Capillary: 149 mg/dL — ABNORMAL HIGH (ref 70–99)
Glucose-Capillary: 190 mg/dL — ABNORMAL HIGH (ref 70–99)
Glucose-Capillary: 172 mg/dL — ABNORMAL HIGH (ref 70–99)

## 2022-03-16 MED ORDER — METOPROLOL SUCCINATE ER 25 MG PO TB24: 12.5000 mg | ORAL_TABLET | Freq: Every day | ORAL | 3 refills | Status: DC

## 2022-03-16 MED ORDER — ATORVASTATIN CALCIUM 80 MG PO TABS: 80.0000 mg | ORAL_TABLET | Freq: Every day | ORAL | 3 refills | Status: DC

## 2022-03-16 MED ORDER — LANTUS SOLOSTAR 100 UNIT/ML ~~LOC~~ SOPN: 9.0000 [IU] | PEN_INJECTOR | Freq: Every day | SUBCUTANEOUS | 11 refills | Status: AC

## 2022-03-16 MED ORDER — GLIMEPIRIDE 4 MG PO TABS: 4.0000 mg | ORAL_TABLET | Freq: Every day | ORAL | 0 refills | Status: DC

## 2022-03-16 MED ORDER — TICAGRELOR 90 MG PO TABS: 90.0000 mg | ORAL_TABLET | Freq: Two times a day (BID) | ORAL | 11 refills | Status: AC

## 2022-03-16 NOTE — Discharge Summary (Signed)
Discharge Summary    Patient ID: Jesus Cruz MRN: [id number]; DOB: [birth year]  Admit date: 03/14/2022 Discharge date: 03/16/2022  PCP:  Charlotte Sanes, MD   [place] Prov[facility] Cardiologist:  Lauree Chandler, MD      Discharge Diagnoses    Principal Problem:   NSTEMI (non-ST elevated myocardial infarction) [facility]) Active Problems:   HTN (hypertension)   Non-ST elevation (NSTEMI) myocardial infarction ([place])   HLD (hyperlipidemia)   DM (diabetes mellitus), type 2 with complications [facility])   Diabetic foot ulcer ([place])    Diagnostic Studies/Procedures    Left Heart Catheterization 03/14/22   Prox RCA lesion is 30% stenosed.   1st Diag lesion is 40% stenosed.   1st Mrg lesion is 60% stenosed.   RPDA lesion is 100% stenosed.   Ramus lesion is 99% stenosed.   Mid RCA lesion is 60% stenosed.   Prox RCA to Mid RCA lesion is 20% stenosed.   A drug-eluting stent was successfully placed using a SYNERGY XD 2.50X16.   Post intervention, there is a 0% residual stenosis.   The left main is a very large vessel. There are 4 branches that arise from the left main. Mild non-obstructive plaque in the LAD. The first intermediate branch has mild non-obstructive disease. The other intermediate branch has a severe mid vessel stenosis. (Felt to be the culprit vessel).  The Circumflex has mild to moderate mid vessel stenosis. The obtuse marginal branch has moderate stenosis, unchanged from last cath.  The RCA is a large dominant vessel with a moderate proximal stenosis, patent mid vessel stented segment with minimal restenosis. The mid RCA beyond the stent has a focal, eccentric calcified stenosis that is unchanged from last cath. This does not appear to be flow limiting. Chronic occlusion of the PDA Successful PTCA/DES x 1 intermediate branch.    Recommendations: DAPT with ASA and Brilinta for one year. High intensity statin. Add back home medications as BP tolerates. We will ask the  DM management team to see him to discuss his home diabetes regimen. Echo tomorrow to assess LV function.  Diagnostic Dominance: Co-dominant  Intervention     Echocardiogram 03/15/22 1. Left ventricular ejection fraction, by estimation, is 60 to 65%. The  left ventricle has normal function. Left ventricular endocardial border  not optimally defined to evaluate regional wall motion. There is mild  concentric left ventricular  hypertrophy. Left ventricular diastolic parameters are consistent with  Grade I diastolic dysfunction (impaired relaxation).   2. Right ventricular systolic function is normal. The right ventricular  size is normal.   3. The mitral valve is grossly normal. No evidence of mitral valve  regurgitation. No evidence of mitral stenosis.   4. The aortic valve is tricuspid. There is mild calcification of the  aortic valve. Aortic valve regurgitation is not visualized. Aortic valve  sclerosis/calcification is present, without any evidence of aortic  stenosis.   5. The inferior vena cava is dilated in size with >50% respiratory  variability, suggesting right atrial pressure of 8 mmHg.   6. Technically difficult study due to poor sound wave images.  _____________   History of Present Illness     Jesus Cruz is a 57 y.o. male with a past medical history of CAD, hypertension, MGUS, obesity, sleep apnea, history of PE, CKD stage III, type 2 diabetes.  Patient had a left heart catheterization in 03/2019 that showed 100% stenosis in the mid PDA.  It was small caliber vessel and was not  a good target for PCI.  There was a previously placed stent in the proximal-mid RCA that was widely patent.  There was also 30% stenosis in proximal RCA, 60% stenosis in mid RCA, 40% stenosis in first diagonal, 50% stenosis in ramus/OM, 60% stenosis in first marginal.  Echocardiogram in 03/2019 showed EF 55-60%, mild LVH, normal RV systolic function.  Patient had presented to [facility]  complaining of chest pain.  EKG at [facility] was without ischemic changes.  Troponin elevated.  Patient was seen by Dr. Bettina Gavia at [facility], and he was transferred to [facility] for cardiac catheterization.  At the time of arrival to [facility], patient was without chest pain.   Hospital Course     Consultants: Wound care RN, plastic surgery, diabetes coordinator   NSTEMI  CAD  - Patient has a known history of coronary artery disease. Previously had stenting to the proximal-mid RCA.  Cardiac catheterization in 2021 showed RCA stent was widely patent, 100% stenosis in the mid PDA (not a target for PCI).  Cath also showed 30% stenosis in proximal RCA, 60% stenosis in mid RCA, 40% stenosis in first diagonal, 50% stenosis in ramus/OM, 60% stenosis in first marginal - Now, patient presented to [facility] complaining of chest pain. EKG was nonischemic. Ruled in for NSTEMI with elevated Troponin.  - Patient was transferred to [facility] for cardiac catheterization - Underwent cardiac catheterization on 3/7 with PTCA/DES x 1 to the intermediate branch. Full cath report listed above  - Patient is to remain on DAPT with ASA and Brilinta for 1 year.  - Increased atorvastatin from 10 mg daily to 80 mg daily  - Continue metoprolol succinate 12.5 mg daily  -Echocardiogram this admission with EF 60-65%, normal RV systolic function. - Discussed radial site care and provided written instructions on AVS  - Confirmed that patient's preferred pharmacy had brilinta in stock   HTN  -BP was overall well-controlled this admission -Continue lisinopril 10 mg daily, metoprolol succinate 12.5 mg daily  HLD  - Goal LDL less than 70 - LDL this admission 76 - Increase to Lipitor from 10 mg daily to 80 mg daily - Will need LFTs, fasting lipid panel in 8 weeks  Type 2 DM  - A1c 11.4 -Patient was seen by the diabetes coordinator this admission.  Outpatient diabetes medications recommendations included Lantus 9  units daily, Amaryl 4 mg at at bedtime -Further managed by PCP as an outpatient  Left Foot Ulcer  - Patient has a full-thickness left foot ulcer.  Followed by Dr. Welton Flakes at the outpatient wound care center in [facility] -Patient was seen by wound care RN on 3/8-used a silver Hydrofiber wound dressing while admitted -Patient was also seen by plastic surgery on 3/8.  Per their note, foot was well-perfused and there was no odor, purulence, fluctuance, crepitus, or cellulitic changes.  Recommended continued dressing changes and elevating leg when possible to help with swelling.  Tobacco Use  - Patient was counseled on tobacco cessation   Did the patient have an acute coronary syndrome (MI, NSTEMI, STEMI, etc) this admission?:  Yes                               AHA/ACC Clinical Performance & Quality Measures: Aspirin prescribed? - Yes ADP Receptor Inhibitor (Plavix/Clopidogrel, Brilinta/Ticagrelor or Effient/Prasugrel) prescribed (includes medically managed patients)? - Yes Beta Blocker prescribed? - Yes High Intensity Statin (Lipitor 40-'80mg'$  or  Crestor 20-'40mg'$ ) prescribed? - Yes EF assessed during THIS hospitalization? - Yes For EF <40%, was ACEI/ARB prescribed? - Not Applicable (EF >/= [id number]) For EF <40%, Aldosterone Antagonist (Spironolactone or Eplerenone) prescribed? - Not Applicable (EF >/= [id number]) Cardiac Rehab Phase II ordered (including medically managed patients)? - Yes       Patient was seen and examined by Dr. Audie Box and was deemed stable for discharge.  Patient has a follow up appointment on 3/20  _____________  Discharge Vitals Blood pressure 118/64, pulse 80, temperature 97.8 F (36.6 C), temperature source Oral, resp. rate 20, height '5\' 7"'$  (1.702 m), weight (!) 141.1 kg, SpO2 97 %.  Filed Weights   03/14/22 1915  Weight: (!) 141.1 kg    Labs & Radiologic Studies    CBC Recent Labs    03/15/22 0245  WBC 6.3  HGB 13.1  HCT 39.4  MCV 93.4  PLT 99991111   Basic  Metabolic Panel Recent Labs    03/15/22 0245  NA 138  K 4.5  CL 107  CO2 25  GLUCOSE 129*  BUN 13  CREATININE 1.14  CALCIUM 8.7*   Liver Function Tests No results for input(s): "AST", "ALT", "ALKPHOS", "BILITOT", "PROT", "ALBUMIN" in the last 72 hours. No results for input(s): "LIPASE", "AMYLASE" in the last 72 hours. High Sensitivity Troponin:   Recent Labs  Lab 03/15/22 1154  TROPONINIHS 4,913*    BNP Invalid input(s): "POCBNP" D-Dimer No results for input(s): "DDIMER" in the last 72 hours. Hemoglobin A1C Recent Labs    03/14/22 1630  HGBA1C 11.4*   Fasting Lipid Panel Recent Labs    03/15/22 0245  LDLDIRECT 76   Thyroid Function Tests No results for input(s): "TSH", "T4TOTAL", "T3FREE", "THYROIDAB" in the last 72 hours.  Invalid input(s): "FREET3" _____________  ECHOCARDIOGRAM COMPLETE  Result Date: 03/15/2022    ECHOCARDIOGRAM REPORT   Patient Name:   PARK SUDHOFF Date of Exam: 03/15/2022 Medical Rec #:  [id number]          Height:       67.0 in Accession #:    [id number]         Weight:       311.1 lb Date of Birth:  10-27-1965           BSA:          2.440 m Patient Age:    8 years           BP:           115/82 mmHg Patient Gender: M                  HR:           90 bpm. Exam Location:  Inpatient Procedure: 2D Echo, Color Doppler, Cardiac Doppler and Intracardiac            Opacification Agent Indications:    I25.10 CAD  History:        Patient has prior history of Echocardiogram examinations, most                 recent 03/31/2019. CAD, Stroke and Acute renal failure; CKD,                 Signs/Symptoms:Edema; Risk Factors:Diabetes, Hypertension,                 Dyslipidemia and Current Smoker.  Sonographer:    Wilkie Aye RVT RCS Referring Phys: [place] Comments:  Technically challenging study due to limited acoustic windows, suboptimal parasternal window, Technically difficult study due to poor echo windows, suboptimal apical  window and patient is obese. Image acquisition challenging due to patient body habitus. IMPRESSIONS  1. Left ventricular ejection fraction, by estimation, is 60 to 65%. The left ventricle has normal function. Left ventricular endocardial border not optimally defined to evaluate regional wall motion. There is mild concentric left ventricular hypertrophy. Left ventricular diastolic parameters are consistent with Grade I diastolic dysfunction (impaired relaxation).  2. Right ventricular systolic function is normal. The right ventricular size is normal.  3. The mitral valve is grossly normal. No evidence of mitral valve regurgitation. No evidence of mitral stenosis.  4. The aortic valve is tricuspid. There is mild calcification of the aortic valve. Aortic valve regurgitation is not visualized. Aortic valve sclerosis/calcification is present, without any evidence of aortic stenosis.  5. The inferior vena cava is dilated in size with >50% respiratory variability, suggesting right atrial pressure of 8 mmHg.  6. Technically difficult study due to poor sound wave images. FINDINGS  Left Ventricle: Left ventricular ejection fraction, by estimation, is 60 to 65%. The left ventricle has normal function. Left ventricular endocardial border not optimally defined to evaluate regional wall motion. The left ventricular internal cavity size was normal in size. There is mild concentric left ventricular hypertrophy. Left ventricular diastolic parameters are consistent with Grade I diastolic dysfunction (impaired relaxation). Right Ventricle: The right ventricular size is normal. No increase in right ventricular wall thickness. Right ventricular systolic function is normal. Left Atrium: Left atrial size was normal in size. Right Atrium: Right atrial size was normal in size. Pericardium: There is no evidence of pericardial effusion. Mitral Valve: The mitral valve is grossly normal. No evidence of mitral valve regurgitation. No evidence of  mitral valve stenosis. Tricuspid Valve: The tricuspid valve is normal in structure. Tricuspid valve regurgitation is trivial. No evidence of tricuspid stenosis. Aortic Valve: The aortic valve is tricuspid. There is mild calcification of the aortic valve. Aortic valve regurgitation is not visualized. Aortic valve sclerosis/calcification is present, without any evidence of aortic stenosis. Aortic valve mean gradient measures 2.0 mmHg. Aortic valve peak gradient measures 4.0 mmHg. Aortic valve area, by VTI measures 4.51 cm. Pulmonic Valve: The pulmonic valve was not well visualized. Pulmonic valve regurgitation is not visualized. No evidence of pulmonic stenosis. Aorta: The aortic root is normal in size and structure. Venous: The inferior vena cava is dilated in size with greater than 50% respiratory variability, suggesting right atrial pressure of 8 mmHg. IAS/Shunts: No atrial level shunt detected by color flow Doppler.  LEFT VENTRICLE PLAX 2D LVIDd:         5.40 cm   Diastology LVIDs:         3.50 cm   LV e' medial:    6.09 cm/s LV PW:         1.20 cm   LV E/e' medial:  10.1 LV IVS:        1.20 cm   LV e' lateral:   8.16 cm/s LVOT diam:     2.30 cm   LV E/e' lateral: 7.5 LV SV:         76 LV SV Index:   31 LVOT Area:     4.15 cm  RIGHT VENTRICLE             IVC RV Basal diam:  2.70 cm     IVC diam: 2.50 cm RV S prime:  11.50 cm/s TAPSE (M-mode): 2.2 cm LEFT ATRIUM           Index        RIGHT ATRIUM          Index LA Vol (A4C): 26.6 ml 10.90 ml/m  RA Area:     7.83 cm                                    RA Volume:   15.40 ml 6.31 ml/m  AORTIC VALVE AV Area (Vmax):    4.39 cm AV Area (Vmean):   4.42 cm AV Area (VTI):     4.51 cm AV Vmax:           99.40 cm/s AV Vmean:          66.700 cm/s AV VTI:            0.169 m AV Peak Grad:      4.0 mmHg AV Mean Grad:      2.0 mmHg LVOT Vmax:         105.00 cm/s LVOT Vmean:        71.000 cm/s LVOT VTI:          0.184 m LVOT/AV VTI ratio: 1.09  AORTA Ao Root diam: 3.00  cm Ao Asc diam:  3.40 cm Ao Arch diam: 3.2 cm MITRAL VALVE MV Area (PHT): 4.12 cm    SHUNTS MV Decel Time: 184 msec    Systemic VTI:  0.18 m MV E velocity: 61.30 cm/s  Systemic Diam: 2.30 cm MV A velocity: 78.60 cm/s MV E/A ratio:  0.78 Glori Bickers MD Electronically signed by Glori Bickers MD Signature Date/Time: 03/15/2022/12:51:41 PM    Final    CARDIAC CATHETERIZATION  Result Date: 03/14/2022   Prox RCA lesion is 30% stenosed.   1st Diag lesion is 40% stenosed.   1st Mrg lesion is 60% stenosed.   RPDA lesion is 100% stenosed.   Ramus lesion is 99% stenosed.   Mid RCA lesion is 60% stenosed.   Prox RCA to Mid RCA lesion is 20% stenosed.   A drug-eluting stent was successfully placed using a SYNERGY XD 2.50X16.   Post intervention, there is a 0% residual stenosis. The left main is a very large vessel. There are 4 branches that arise from the left main. Mild non-obstructive plaque in the LAD. The first intermediate branch has mild non-obstructive disease. The other intermediate branch has a severe mid vessel stenosis. (Felt to be the culprit vessel). The Circumflex has mild to moderate mid vessel stenosis. The obtuse marginal branch has moderate stenosis, unchanged from last cath. The RCA is a large dominant vessel with a moderate proximal stenosis, patent mid vessel stented segment with minimal restenosis. The mid RCA beyond the stent has a focal, eccentric calcified stenosis that is unchanged from last cath. This does not appear to be flow limiting. Chronic occlusion of the PDA Successful PTCA/DES x 1 intermediate branch. Recommendations: DAPT with ASA and Brilinta for one year. High intensity statin. Add back home medications as BP tolerates. We will ask the DM management team to see him to discuss his home diabetes regimen. Echo tomorrow to assess LV function.   Disposition   Pt is being discharged home today in good condition.  Follow-up Plans & Appointments     Follow-up Information      Barbarann Ehlers Junius Creamer., NP Follow up on 03/27/2022.  Specialty: Cardiology Why: Appointment at 8:25 Contact information: [address] [place] [address] [phone]                Discharge Instructions     Amb Referral to Cardiac Rehabilitation   Complete by: As directed    Diagnosis:  NSTEMI Coronary Stents     After initial evaluation and assessments completed: Virtual Based Care may be provided alone or in conjunction with Phase 2 Cardiac Rehab based on patient barriers.: Yes   Intensive Cardiac Rehabilitation (ICR) [place] location only OR Traditional Cardiac Rehabilitation (TCR) *If criteria for ICR are not met will enroll in TCR [facility] only): Yes   Diet - low sodium heart healthy   Complete by: As directed    Discharge instructions   Complete by: As directed    Radial Site Care Refer to this sheet in the next few weeks. These instructions provide you with information on caring for yourself after your procedure. Your caregiver may also give you more specific instructions. Your treatment has been planned according to current medical practices, but problems sometimes occur. Call your caregiver if you have any problems or questions after your procedure. HOME CARE INSTRUCTIONS You may shower the day after the procedure. Remove the bandage (dressing) and gently wash the site with plain soap and water. Gently pat the site dry.  Do not apply powder or lotion to the site.  Do not submerge the affected site in water for 3 to 5 days.  Inspect the site at least twice daily.  Do not flex or bend the affected arm for 24 hours.  No lifting over 5 pounds (2.3 kg) for 5 days after your procedure.  Do not drive home if you are discharged the same day of the procedure. Have someone else drive you.  You may drive 24 hours after the procedure unless otherwise instructed by your caregiver.  What to expect: Any bruising will usually fade within 1 to 2 weeks.  Blood that  collects in the tissue (hematoma) may be painful to the touch. It should usually decrease in size and tenderness within 1 to 2 weeks.  SEEK IMMEDIATE MEDICAL CARE IF: You have unusual pain at the radial site.  You have redness, warmth, swelling, or pain at the radial site.  You have drainage (other than a small amount of blood on the dressing).  You have chills.  You have a fever or persistent symptoms for more than 72 hours.  You have a fever and your symptoms suddenly get worse.  Your arm becomes pale, cool, tingly, or numb.  You have heavy bleeding from the site. Hold pressure on the site.   PLEASE DO NOT MISS ANY DOSES OF YOUR BRILINTA!!!!! Also keep a log of you blood pressures and bring back to your follow up appt. Please call the office with any questions.   Patients taking blood thinners should generally stay away from medicines like ibuprofen, Advil, Motrin, naproxen, and Aleve due to risk of stomach bleeding. You may take Tylenol as directed or talk to your primary doctor about alternatives.  PLEASE ENSURE THAT YOU DO NOT RUN OUT OF YOUR BRILINTA This medication is very important to remain on for at least one year. IF you have issues obtaining this medication due to cost please CALL the office 3-5 business days prior to running out in order to prevent missing doses of this medication.   Discharge wound care:   Complete by: As directed  Continue wound care as instructed by Dr. Welton Flakes (your outpatient wound care doctor)   Increase activity slowly   Complete by: As directed         Discharge Medications   Allergies as of 03/16/2022       Reactions   Other Nausea And Vomiting   Mayonnaise         Medication List     STOP taking these medications    amLODipine 10 MG tablet Commonly known as: NORVASC   clopidogrel 75 MG tablet Commonly known as: PLAVIX   doxycycline 100 MG tablet Commonly known as: VIBRA-TABS   hydrALAZINE 25 MG tablet Commonly known as:  APRESOLINE   metoCLOPramide 5 MG tablet Commonly known as: REGLAN       TAKE these medications    allopurinol 300 MG tablet Commonly known as: ZYLOPRIM Take 400 mg by mouth 2 (two) times daily.   aspirin EC 81 MG tablet Take 81 mg by mouth at bedtime.   atorvastatin 80 MG tablet Commonly known as: LIPITOR Take 1 tablet (80 mg total) by mouth at bedtime. What changed:  medication strength how much to take   blood glucose meter kit and supplies Dispense based on patient and insurance preference. Use up to four times daily as directed. (FOR ICD-10 E10.9, E11.9).   clotrimazole-betamethasone cream Commonly known as: LOTRISONE Apply 1 application topically in the morning and at bedtime.   colchicine 0.6 MG tablet Take 0.6 mg by mouth daily.   cyclobenzaprine 10 MG tablet Commonly known as: FLEXERIL Take 1 tablet (10 mg total) by mouth 3 (three) times daily as needed for muscle spasms. What changed: when to take this   doxazosin 2 MG tablet Commonly known as: CARDURA Take 2 mg by mouth at bedtime.   glimepiride 4 MG tablet Commonly known as: AMARYL Take 1 tablet (4 mg total) by mouth at bedtime. What changed: when to take this   HYDROcodone-acetaminophen 5-325 MG tablet Commonly known as: NORCO/VICODIN Take 1 tablet by mouth 3 (three) times daily.   Insulin Pen Needle 31G X 5 MM Misc Use with Levemir flexpen   Lantus SoloStar 100 UNIT/ML Solostar Pen Generic drug: insulin glargine Inject 9 Units into the skin daily. What changed: how much to take   lisinopril 10 MG tablet Commonly known as: ZESTRIL Take 10 mg by mouth daily.   metoprolol succinate 25 MG 24 hr tablet Commonly known as: TOPROL-XL Take 0.5 tablets (12.5 mg total) by mouth daily. What changed:  medication strength how much to take   morphine 15 MG 12 hr tablet Commonly known as: MS CONTIN Take 15 mg by mouth 3 (three) times daily.   naloxone 4 MG/0.1ML Liqd nasal spray kit Commonly  known as: NARCAN Place 0.4 mg into the nose once.   nitroGLYCERIN 0.4 MG SL tablet Commonly known as: NITROSTAT Place 0.4 mg under the tongue every 5 (five) minutes as needed for chest pain (max 3 doses).   nystatin powder Commonly known as: MYCOSTATIN/NYSTOP Apply 1 application topically daily.   pantoprazole 40 MG tablet Commonly known as: Protonix Take 1 tablet (40 mg total) by mouth 2 (two) times daily.   predniSONE 5 MG tablet Commonly known as: DELTASONE Take 5 mg by mouth daily with breakfast.   pregabalin 100 MG capsule Commonly known as: LYRICA Take 100 mg by mouth 3 (three) times daily.   ticagrelor 90 MG Tabs tablet Commonly known as: BRILINTA Take 1 tablet (90 mg total) by mouth 2 (  two) times daily.               Discharge Care Instructions  (From admission, onward)           Start     Ordered   03/16/22 0000  Discharge wound care:       Comments: Continue wound care as instructed by Dr. Welton Flakes (your outpatient wound care doctor)   03/16/22 1125               Outstanding Labs/Studies   LFTs and lipid panel in 8 weeks   Duration of Discharge Encounter   Greater than 30 minutes including physician time.  Signed, Margie Billet, PA-C 03/16/2022, 11:25 AM

## 2022-03-16 NOTE — TOC Transition Note (Signed)
Transition of Care [facility]) - CM/SW Discharge Note   Patient Details  Name: Jesus Cruz MRN: [id number] Date of Birth: 1965-06-02  Transition of Care [facility]) CM/SW Contact:  Konrad Penta, RN Phone Number: [phone] 03/16/2022, 2:22 PM   Clinical Narrative:  Jesus Cruz is slated for discharge today. Provided [facility] card. Jesus Cruz lives alone. States his mother passed one year ago. Has a friend that assists with picking up prescriptions and the like. Friend unable to pick him up from hospital today however.  States he usually uses cabs for transportation to appointments. Jesus Cruz states he already has DME at home such as walker, bedside commode, crutches, wheelchair. Goes to [facility] wound care clinic weekly. Denies need for home health RN for wound care or home health therapy. Reports he is usually wheelchair bound.   Discussed the need for [facility] transportation since the cab driver will not be able to assist in the home.   [facility] called at 1336 pm. [facility] paperwork taken to the unit.  Spoke with nursing to make aware of above information.  No further TOC needs identified.     Final next level of care: Home/Self Care Barriers to Discharge: No Barriers Identified   Patient Goals and CMS Choice      Discharge Placement                         Discharge Plan and Services Additional resources added to the After Visit Summary for                                       Social Determinants of Health (SDOH) Interventions SDOH Screenings   Food Insecurity: No Food Insecurity (03/14/2022)  Housing: [place]  (03/14/2022)  Transportation Needs: No Transportation Needs (03/14/2022)  Utilities: At Risk (03/14/2022)  Tobacco Use: High Risk (03/15/2022)     Readmission Risk Interventions     No data to display

## 2022-03-22 ENCOUNTER — Telehealth (HOSPITAL_COMMUNITY): Payer: Self-pay

## 2022-03-22 NOTE — Telephone Encounter (Signed)
Referral faxed to [facility]. 

## 2022-03-27 ENCOUNTER — Ambulatory Visit: Payer: Medicare Other | Admitting: Nurse Practitioner

## 2022-04-02 IMAGING — MR MR FOOT*R* WO/W CM
4 of 9 series · 17 of 40 positions shown · IV contrast (gadavist)
Comparison: Foot radiographs from 10/22/2019 and MRI ankle from
03/19/2015

CLINICAL DATA: Right leg pain, swelling, and erythema for 3 days.

EXAM:
MRI OF THE RIGHT HINDFOOT WITHOUT AND WITH CONTRAST
TECHNIQUE: Multiplanar, multisequence MR imaging of the ankle was performed
before and after the administration of intravenous contrast.
CONTRAST:  10mL GADAVIST GADOBUTROL 1 MMOL/ML IV SOLN

[Series 6: T1 · axial · 4.0mm · 0.34mm/px · z∈[-48,+115]mm · 5 of 38 slices shown]
[im 1/38]
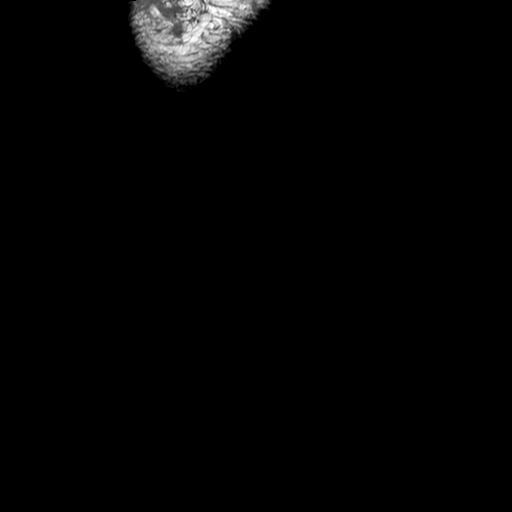
[im 10/38]
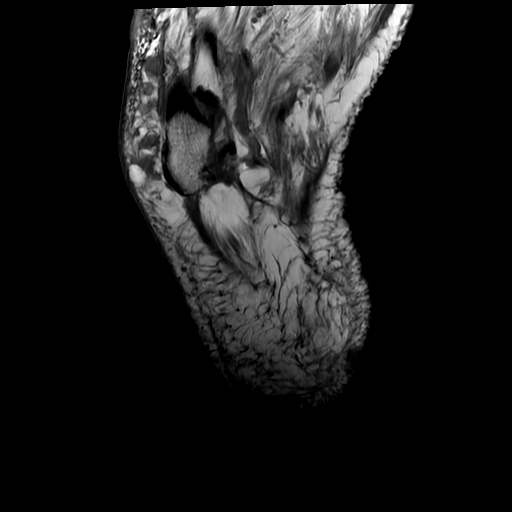
[im 19/38]
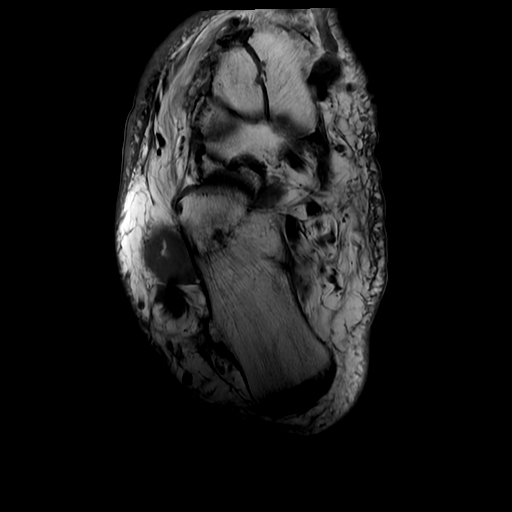
[im 28/38]
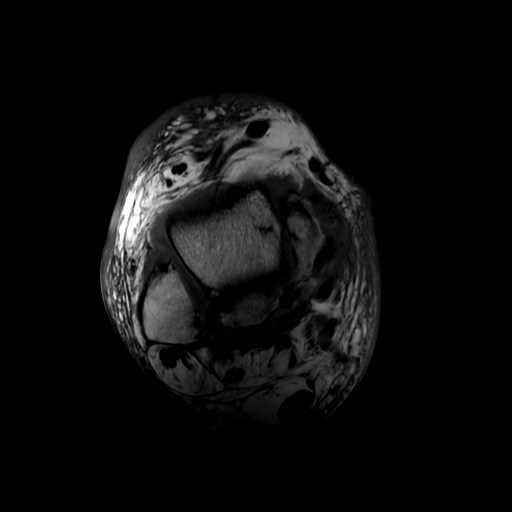
[im 38/38]
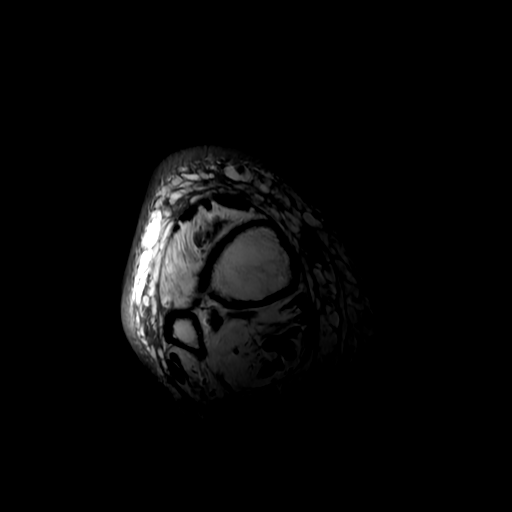

[Series 7: T1 fat-sat · axial · non-contrast · 4.0mm · 0.34mm/px · z∈[-48,+115]mm · 3 of 38 slices shown]
[im 1/38]
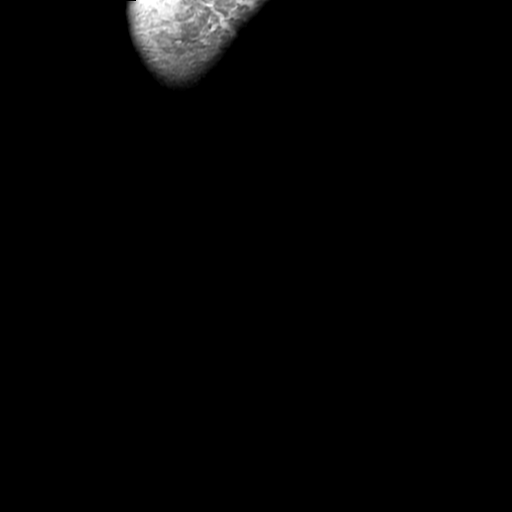
[im 25/38]
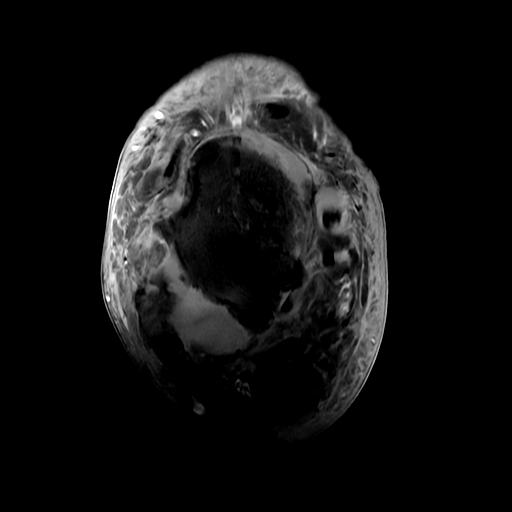
[im 38/38]
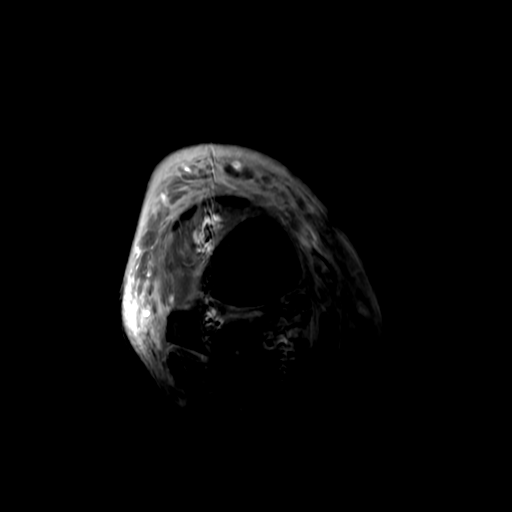

[Series 8: T2 fat-sat · axial · 4.0mm · 0.34mm/px · z∈[-48,+115]mm · 4 of 38 slices shown (1 of 2)]
[im 1/38]
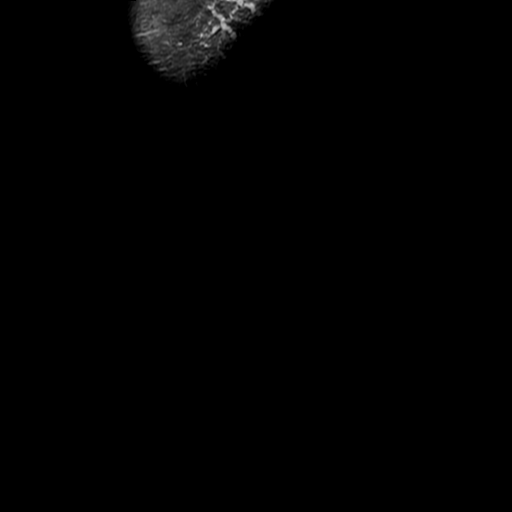
[im 13/38]
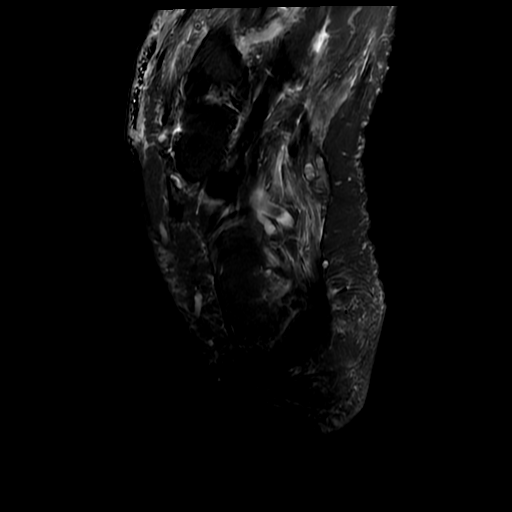
[im 25/38]
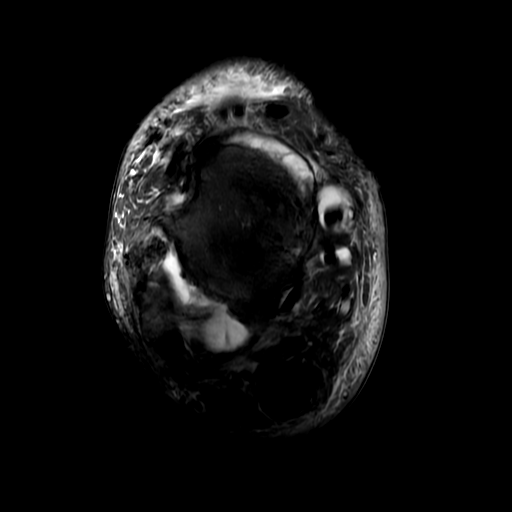
[im 38/38]
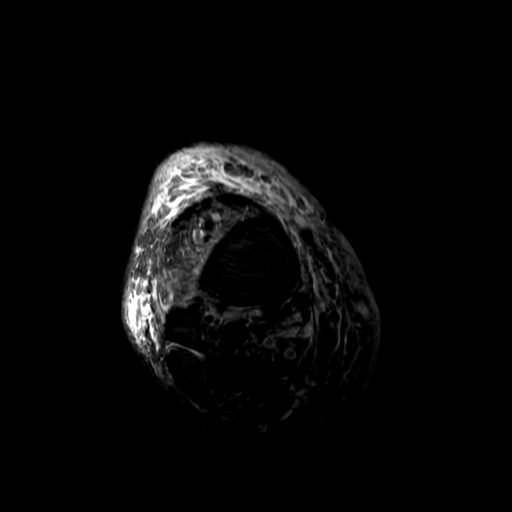

[Series 10: T2 fat-sat · coronal · 3.0mm · 0.31mm/px · 5 of 45 slices shown (2 of 2)]
[im 1/45]
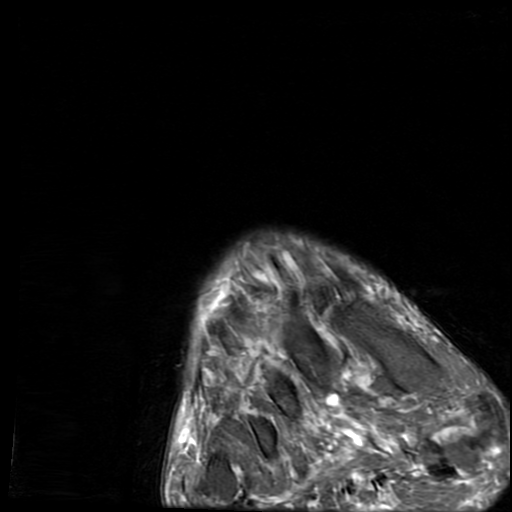
[im 12/45]
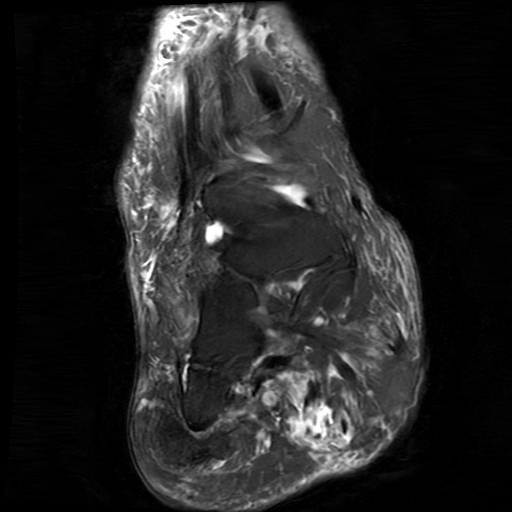
[im 23/45]
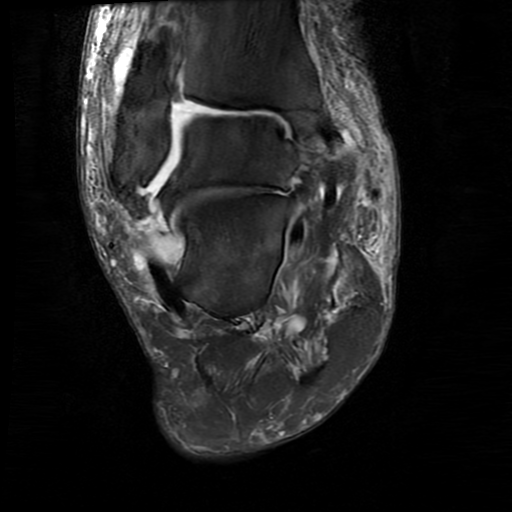
[im 34/45]
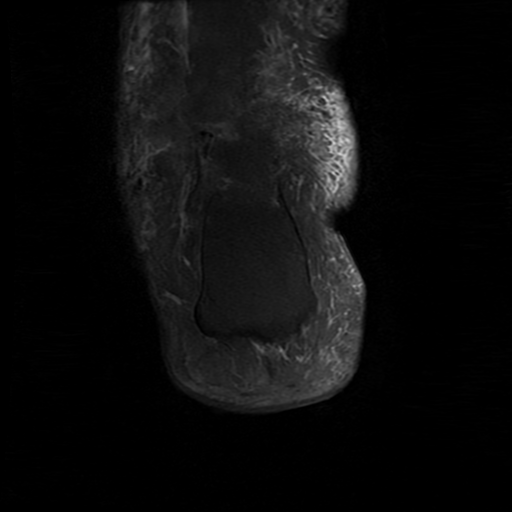
[im 45/45]
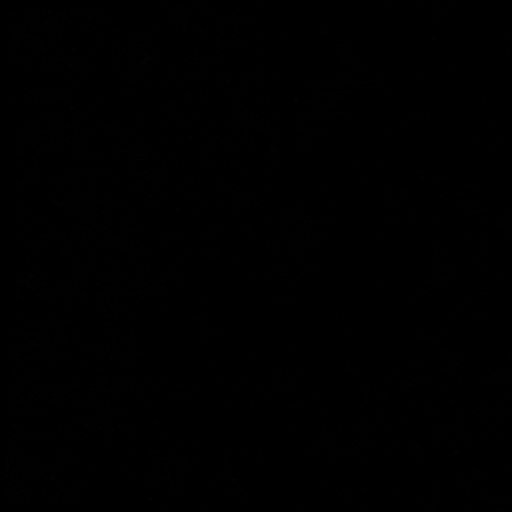

[17 of 40 positions shown; findings below may reference images not displayed]

FINDINGS: Despite efforts by the technologist and patient, motion artifact is
present on today's exam and could not be eliminated. This reduces
exam sensitivity and specificity.

TENDONS

Peroneal: Mild peroneus longus and peroneus brevis tenosynovitis.

Posteromedial: Tibialis posterior tenosynovitis and distal
tendinopathy. Flexor hallucis longus and flexor digitorum longus
tenosynovitis.

Anterior: Indistinct extensor hallucis longus tendon

Achilles: Mild Achilles tendinopathy.

Plantar Fascia: Mildly thickened medial band of the plantar fascia.

LIGAMENTS

Lateral: Thickened anterior inferior tibiofibular ligament.
Amorphous in likely deficient anterior talofibular ligament. Poorly
seen calcaneofibular ligament.

Medial: Ill definition of parts of the deep tibiotalar part of the
deltoid ligament.

CARTILAGE

Ankle Joint: Severe degenerative chondral thinning. Small tibiotalar
joint effusion. Low-level subcortical marrow edema in the distal
tibia and throughout the talus. Mildly accentuated enhancement of
the synovium in the tibiotalar joint suggesting synovitis.

Subtalar Joints/Sinus Tarsi: Subtalar joint effusion with synovitis.
Low-level edema and enhancement throughout the talus and in the
portions of the calcaneus near the subtalar joints. Associated
spurring noted.

Bones: Low-level endosteal edema and enhancement in the distal
fibula. Notable spurring and loss of articular space favoring
degenerative arthropathy in the midfoot and Lisfranc joint.

Other: Diffuse subcutaneous edema in the ankle and tracking into the
dorsum of the foot.

Plantar to the base of the fifth metatarsal, a 4.2 by 2.9 by 1.3 cm
lesion is present with low T1 and T2 signal, and also with a
component tracking along the lateral margin of the shaft of the
fifth metatarsal. This appears to potentially be eroding into the
base of the fourth metatarsal and also somewhat closely associated
with the Lisfranc joint, raising the possibility of erosive
arthropathy with soft tissue lesion. This is not a characteristic
appearance for an abscess and is most likely a gout related tophus.

Subcutaneous edema tracks in the dorsum of the foot. There is
notable edema without much enhancement tracking in the flexor
digitorum brevis muscle.
IMPRESSION: IMPRESSION
1. Tibiotalar and subtalar joint effusions with synovitis and
low-level marrow edema in the distal tibia, distal fibula,
throughout the talus, and in subarticular portions of the calcaneus.
While sepsis of multiple joints along with osteomyelitis is
difficult to exclude with certainty, I would favor the appearance to
be due to severe arthropathy such as gout arthropathy given the
multiple joint involvement, mild degree of marrow edema relative to
the synovitis, and patient history.
2. Erosive nodule along the lateral Lisfranc joint eroding into the
base of the fourth metatarsal with a soft tissue mass component near
the base of the fifth metatarsal, probably a gouty tophus.
3. Diffuse subcutaneous edema in the ankle and tracking into the
dorsum of the foot, including the flexor digitorum brevis muscle.
This is nonspecific and could be due to cellulitis, myositis, or
neurogenic edema.
4. Tibialis posterior tenosynovitis and distal tendinopathy.
5. Mild peroneus longus and peroneus brevis tenosynovitis.
6. Mild Achilles tendinopathy with mild thickening of the medial
band of the plantar fascia.
7. Thickened anterior inferior tibiofibular ligament and likely
deficient anterior talofibular ligament.
8. Ill definition of parts of the deep tibiotalar part of the
deltoid ligament, likely from remote injury.
9. Severe degenerative arthropathy in the midfoot and Lisfranc
joint.

## 2022-04-08 ENCOUNTER — Other Ambulatory Visit: Payer: Self-pay | Admitting: Cardiology

## 2022-04-28 NOTE — Progress Notes (Deleted)
Office Visit    Patient Name: Jesus Cruz Date of Encounter: 04/28/2022  Primary Care Provider:  Shelle Iron, MD Primary Cardiologist:  Verne Carrow, MD Primary Electrophysiologist: None   Past Medical History    Past Medical History:  Diagnosis Date   CVA (cerebral vascular accident) [facility])    Gout    Hypertension    MGUS (monoclonal gammopathy of unknown significance)    Obesity    OSA on CPAP    Pulmonary embolus (HCC) 05/2012   During recovery from MI, completed Coumadin   Stage 3 chronic kidney disease (HCC)    STEMI (ST elevation myocardial infarction) (HCC) 04/2012   Integrity BMS RCA with minor left system disease   Past Surgical History:  Procedure Laterality Date   CORONARY ANGIOPLASTY WITH STENT PLACEMENT  04/2012   Integrity BMS RCA, minor L system dz   CORONARY STENT INTERVENTION N/A 03/14/2022   Procedure: CORONARY STENT INTERVENTION;  Surgeon: Kathleene Hazel, MD;  Location: [facility] INVASIVE CV LAB;  Service: Cardiovascular;  Laterality: N/A;   LEFT HEART CATH AND CORONARY ANGIOGRAPHY N/A 03/31/2019   Procedure: LEFT HEART CATH AND CORONARY ANGIOGRAPHY;  Surgeon: Marykay Lex, MD;  Location: [facility] INVASIVE CV LAB;  Service: Cardiovascular;  Laterality: N/A;   LEFT HEART CATH AND CORONARY ANGIOGRAPHY N/A 03/14/2022   Procedure: LEFT HEART CATH AND CORONARY ANGIOGRAPHY;  Surgeon: Kathleene Hazel, MD;  Location: [facility] INVASIVE CV LAB;  Service: Cardiovascular;  Laterality: N/A;   RADIOLOGY WITH ANESTHESIA N/A 09/16/2013   Procedure: RADIOLOGY WITH ANESTHESIA;  Surgeon: Oneal Grout, MD;  Location: [facility] OR;  Service: Radiology;  Laterality: N/A;    Allergies  Allergies  Allergen Reactions   Other Nausea And Vomiting    Mayonnaise       History of Present Illness    Jesus Cruz  is a 57 year old male with a PMH of CAD s/p previous inferior STEMI 2014 treated with integrity BMS to RCA NSTEMI 03/2022 with PCI to ramus, 6%  residual stenosis in OM1 and mid RCA (medically managed), RPDA too small for intervention, DM type II, CVA, OSA (on CPAP), MGUS, COPD, history of PE (2014) HLD, tobacco abuse who presents today post NSTEMI follow-up.  Jesus Cruz was initially seen in 2021 when he suffered a inferior STEMI and had an integrity BMS placed to his RCA in 2014.  He was admitted 03/2019 with altered mental status and DKA.  Left heart cath was completed showing occlusion of small PDA  moderate mid RCA stenosis, mild intermediate branch/OM stenosis.  He was admitted to the ED on 03/14/2022 after transferring from [facility] with elevated troponins.  LHC was completed with DES x 1 to the intermediate branch and patient was continued on DAPT with ASA and Brilinta for 1 year.   Since last being seen in the office patient reports***.  Patient denies chest pain, palpitations, dyspnea, PND, orthopnea, nausea, vomiting, dizziness, syncope, edema, weight gain, or early satiety.     ***Notes:  Home Medications    Current Outpatient Medications  Medication Sig Dispense Refill   allopurinol (ZYLOPRIM) 300 MG tablet Take 400 mg by mouth 2 (two) times daily.      aspirin EC 81 MG tablet Take 81 mg by mouth at bedtime.     atorvastatin (LIPITOR) 80 MG tablet Take 1 tablet (80 mg total) by mouth at bedtime. 90 tablet 3   blood glucose meter kit and supplies Dispense based on  patient and insurance preference. Use up to four times daily as directed. (FOR ICD-10 E10.9, E11.9). 1 each 0   clotrimazole-betamethasone (LOTRISONE) cream Apply 1 application topically in the morning and at bedtime.     colchicine 0.6 MG tablet Take 0.6 mg by mouth daily.     cyclobenzaprine (FLEXERIL) 10 MG tablet Take 1 tablet (10 mg total) by mouth 3 (three) times daily as needed for muscle spasms. (Patient taking differently: Take 10 mg by mouth in the morning and at bedtime.) 30 tablet 0   doxazosin (CARDURA) 2 MG tablet Take 2 mg by mouth at bedtime.      glimepiride (AMARYL) 4 MG tablet Take 1 tablet (4 mg total) by mouth at bedtime. 30 tablet 1   HYDROcodone-acetaminophen (NORCO/VICODIN) 5-325 MG tablet Take 1 tablet by mouth 3 (three) times daily.     Insulin Pen Needle 31G X 5 MM MISC Use with Levemir flexpen 100 each 1   LANTUS SOLOSTAR 100 UNIT/ML Solostar Pen Inject 9 Units into the skin daily. 15 mL 11   lisinopril (ZESTRIL) 10 MG tablet Take 10 mg by mouth daily.     metoprolol succinate (TOPROL-XL) 25 MG 24 hr tablet Take 0.5 tablets (12.5 mg total) by mouth daily. 45 tablet 3   morphine (MS CONTIN) 15 MG 12 hr tablet Take 15 mg by mouth 3 (three) times daily.     naloxone (NARCAN) nasal spray 4 mg/0.1 mL Place 0.4 mg into the nose once.      nitroGLYCERIN (NITROSTAT) 0.4 MG SL tablet Place 0.4 mg under the tongue every 5 (five) minutes as needed for chest pain (max 3 doses).      nystatin (MYCOSTATIN/NYSTOP) powder Apply 1 application topically daily.     pantoprazole (PROTONIX) 40 MG tablet Take 1 tablet (40 mg total) by mouth 2 (two) times daily. 60 tablet 1   predniSONE (DELTASONE) 5 MG tablet Take 5 mg by mouth daily with breakfast.     pregabalin (LYRICA) 100 MG capsule Take 100 mg by mouth 3 (three) times daily.      ticagrelor (BRILINTA) 90 MG TABS tablet Take 1 tablet (90 mg total) by mouth 2 (two) times daily. 60 tablet 11   No current facility-administered medications for this visit.     Review of Systems  Please see the history of present illness.    (+)*** (+)***  All other systems reviewed and are otherwise negative except as noted above.  Physical Exam    Wt Readings from Last 3 Encounters:  03/14/22 (!) 311 lb 1.1 oz (141.1 kg)  10/23/19 (!) 311 lb 1.1 oz (141.1 kg)  05/31/19 (!) 311 lb 3.2 oz (141.2 kg)   WU:JWJXB were no vitals filed for this visit.,There is no height or weight on file to calculate BMI.  Constitutional:      Appearance: Healthy appearance. Not in distress.  Neck:     Vascular: JVD  normal.  Pulmonary:     Effort: Pulmonary effort is normal.     Breath sounds: No wheezing. No rales. Diminished in the bases Cardiovascular:     Normal rate. Regular rhythm. Normal S1. Normal S2.      Murmurs: There is no murmur.  Edema:    Peripheral edema absent.  Abdominal:     Palpations: Abdomen is soft non tender. There is no hepatomegaly.  Skin:    General: Skin is warm and dry.  Neurological:     General: No focal deficit present.  Mental Status: Alert and oriented to person, place and time.     Cranial Nerves: Cranial nerves are intact.  EKG/LABS/ Recent Cardiac Studies    ECG personally reviewed by me today - ***  Cardiac Studies & Procedures   CARDIAC CATHETERIZATION  CARDIAC CATHETERIZATION 03/14/2022  Narrative   Prox RCA lesion is 30% stenosed.   1st Diag lesion is 40% stenosed.   1st Mrg lesion is 60% stenosed.   RPDA lesion is 100% stenosed.   Ramus lesion is 99% stenosed.   Mid RCA lesion is 60% stenosed.   Prox RCA to Mid RCA lesion is 20% stenosed.   A drug-eluting stent was successfully placed using a SYNERGY XD 2.50X16.   Post intervention, there is a 0% residual stenosis.  The left main is a very large vessel. There are 4 branches that arise from the left main. Mild non-obstructive plaque in the LAD. The first intermediate branch has mild non-obstructive disease. The other intermediate branch has a severe mid vessel stenosis. (Felt to be the culprit vessel). The Circumflex has mild to moderate mid vessel stenosis. The obtuse marginal branch has moderate stenosis, unchanged from last cath. The RCA is a large dominant vessel with a moderate proximal stenosis, patent mid vessel stented segment with minimal restenosis. The mid RCA beyond the stent has a focal, eccentric calcified stenosis that is unchanged from last cath. This does not appear to be flow limiting. Chronic occlusion of the PDA Successful PTCA/DES x 1 intermediate branch.  Recommendations:  DAPT with ASA and Brilinta for one year. High intensity statin. Add back home medications as BP tolerates. We will ask the DM management team to see him to discuss his home diabetes regimen. Echo tomorrow to assess LV function.  Findings Coronary Findings Diagnostic  Dominance: Co-dominant  Left Main Vessel is large. Very large, patulous vessel with 4 branches  Left Anterior Descending There is mild diffuse disease throughout the vessel.  First Diagonal Branch Vessels moderate-large in size There is mild disease in the vessel. Vessel actually appears to be a tandem ramus intermedius branch 1st Diag lesion is 40% stenosed. The lesion is irregular.  First Septal Branch Vessel is small in size.  Second Diagonal Branch Vessel is small in size.  Third Diagonal Branch Vessel is small in size.  Ramus Intermedius Vessel is large. There is mild diffuse disease throughout the vessel. Ramus lesion is 99% stenosed. The lesion is segmental, eccentric and irregular.  Left Circumflex Vessel is large. There is mild diffuse disease throughout the vessel.  First Obtuse Marginal Branch There is mild disease in the vessel. 1st Mrg lesion is 60% stenosed. The lesion is discrete and irregular.  Left Posterior Atrioventricular Artery Vessel is large in size.  Right Coronary Artery Prox RCA lesion is 30% stenosed. Prox RCA to Mid RCA lesion is 20% stenosed. The lesion was previously treated using a stent (unknown type) over 2 years ago. Mid RCA lesion is 60% stenosed. The lesion is located at the bend. The lesion is calcified.  Acute Marginal Branch Vessel is small in size.  Right Ventricular Branch Vessel is small in size.  Right Posterior Descending Artery Vessel is small in size. RPDA lesion is 100% stenosed. Vessel is the culprit lesion. Vessel is relatively small in caliber, not amenable to PCI.  Right Posterior Atrioventricular Artery Vessel is small in  size.  Intervention  Ramus lesion Stent CATH VISTA GUIDE 6FR XBLAD3.5 guide catheter was inserted. Lesion crossed with guidewire using a WIRE  COUGAR XT STRL 190CM. A drug-eluting stent was successfully placed using a SYNERGY XD 2.50X16. Stent strut is well apposed. Post-stent angioplasty was performed using a BALLN [employer] EMERGE MR 2.75X8. Post-Intervention Lesion Assessment The intervention was successful. Pre-interventional TIMI flow is 3. Post-intervention TIMI flow is 3. No complications occurred at this lesion. There is a 0% residual stenosis post intervention.   CARDIAC CATHETERIZATION  CARDIAC CATHETERIZATION 03/31/2019  Narrative Images from the original result were not included.   LIKELY CULPRIT LESION: Mid PDA (small caliber) 100% -> not good PCI target.  Prox RCA lesion is 30% stenosed.  Previously placed Prox RCA to Mid RCA stent (unknown type) is widely patent.  Mid RCA lesion is 60% stenosed.  1st Diag/Ramus lesion is 40% stenosed.  Ramus/OM lesion is 50% stenosed.  1st Mrg/Distal LCx lesion is 60% stenosed.  LV end diastolic pressure is normal.  POST-OPERATIVE DIAGNOSIS:  Likely culprit lesion is mid PDA (small caliber) occlusion -> plan medical management  Otherwise patent RCA stent with diffuse moderate mid RCA disease (60%), 46% stenosis in tandem ramus branches and OM1  Normal LVEDP with systemic hypotension (blood pressures ranging from high 80s to low 90s) -patient mentating well.  Does not appear to be true shock (likely related to sedation)  Hyperdynamic cardiac motion (very difficult to engage coronary arteries)  Large patulous left main with 4 major branches  PLAN OF CARE:  Transfer back to MICU (2 M) -> we will restart IV heparin 8 hours after sheath removal.  Would run IV heparin for total 72 hours including last night.  As needed pain meds  We will defer to consulting team, but could consider clopidogrel (we will load today)  We will  defer management of renal failure to primary team/nephrology.     Bryan Lemma, M.D., M.S. Interventional Cardiologist  [address] [place], [place] [address]  Findings Coronary Findings Diagnostic  Dominance: Co-dominant  Left Main Vessel is large. Very large, patulous vessel with 4 branches  Left Anterior Descending There is mild diffuse disease throughout the vessel.  First Diagonal Branch Vessels moderate-large in size There is mild disease in the vessel. Vessel actually appears to be a tandem ramus intermedius branch 1st Diag lesion is 40% stenosed. The lesion is irregular.  First Septal Branch Vessel is small in size.  Second Diagonal Branch Vessel is small in size.  Third Diagonal Branch Vessel is small in size.  Ramus Intermedius Vessel is large. There is mild diffuse disease throughout the vessel. Ramus lesion is 50% stenosed. The lesion is segmental, eccentric and irregular.  Left Circumflex Vessel is large. There is mild diffuse disease throughout the vessel.  First Obtuse Marginal Branch There is mild disease in the vessel. 1st Mrg lesion is 60% stenosed. The lesion is discrete and irregular.  Left Posterior Atrioventricular Artery Vessel is large in size.  Right Coronary Artery Prox RCA lesion is 30% stenosed. Previously placed Prox RCA to Mid RCA stent (unknown type) is widely patent. Mid RCA lesion is 60% stenosed. The lesion is located at the bend.  Acute Marginal Branch Vessel is small in size.  Right Ventricular Branch Vessel is small in size.  Right Posterior Descending Artery Vessel is small in size. RPDA lesion is 100% stenosed. Vessel is the culprit lesion. Vessel is relatively small in caliber, not amenable to PCI.  Right Posterior Atrioventricular Artery Vessel is small in size.  Intervention  No interventions have been documented.     ECHOCARDIOGRAM  ECHOCARDIOGRAM  COMPLETE  03/15/2022  Narrative ECHOCARDIOGRAM REPORT    Patient Name:   Jesus Cruz Date of Exam: 03/15/2022 Medical Rec #:  [id number]          Height:       67.0 in Accession #:    [id number]         Weight:       311.1 lb Date of Birth:  July 22, 1965           BSA:          2.440 m Patient Age:    57 years           BP:           115/82 mmHg Patient Gender: M                  HR:           90 bpm. Exam Location:  Inpatient  Procedure: 2D Echo, Color Doppler, Cardiac Doppler and Intracardiac Opacification Agent  Indications:    I25.10 CAD  History:        Patient has prior history of Echocardiogram examinations, most recent 03/31/2019. CAD, Stroke and Acute renal failure; CKD, Signs/Symptoms:Edema; Risk Factors:Diabetes, Hypertension, Dyslipidemia and Current Smoker.  Sonographer:    Dondra Prader RVT RCS Referring Phys: [id number] D [facility]   Sonographer Comments: Technically challenging study due to limited acoustic windows, suboptimal parasternal window, Technically difficult study due to poor echo windows, suboptimal apical window and patient is obese. Image acquisition challenging due to patient body habitus. IMPRESSIONS   1. Left ventricular ejection fraction, by estimation, is 60 to 65%. The left ventricle has normal function. Left ventricular endocardial border not optimally defined to evaluate regional wall motion. There is mild concentric left ventricular hypertrophy. Left ventricular diastolic parameters are consistent with Grade I diastolic dysfunction (impaired relaxation). 2. Right ventricular systolic function is normal. The right ventricular size is normal. 3. The mitral valve is grossly normal. No evidence of mitral valve regurgitation. No evidence of mitral stenosis. 4. The aortic valve is tricuspid. There is mild calcification of the aortic valve. Aortic valve regurgitation is not visualized. Aortic valve sclerosis/calcification is present, without any evidence of  aortic stenosis. 5. The inferior vena cava is dilated in size with >50% respiratory variability, suggesting right atrial pressure of 8 mmHg. 6. Technically difficult study due to poor sound wave images.  FINDINGS Left Ventricle: Left ventricular ejection fraction, by estimation, is 60 to 65%. The left ventricle has normal function. Left ventricular endocardial border not optimally defined to evaluate regional wall motion. The left ventricular internal cavity size was normal in size. There is mild concentric left ventricular hypertrophy. Left ventricular diastolic parameters are consistent with Grade I diastolic dysfunction (impaired relaxation).  Right Ventricle: The right ventricular size is normal. No increase in right ventricular wall thickness. Right ventricular systolic function is normal.  Left Atrium: Left atrial size was normal in size.  Right Atrium: Right atrial size was normal in size.  Pericardium: There is no evidence of pericardial effusion.  Mitral Valve: The mitral valve is grossly normal. No evidence of mitral valve regurgitation. No evidence of mitral valve stenosis.  Tricuspid Valve: The tricuspid valve is normal in structure. Tricuspid valve regurgitation is trivial. No evidence of tricuspid stenosis.  Aortic Valve: The aortic valve is tricuspid. There is mild calcification of the aortic valve. Aortic valve regurgitation is not visualized. Aortic valve sclerosis/calcification is present, without any evidence of aortic stenosis. Aortic  valve mean gradient measures 2.0 mmHg. Aortic valve peak gradient measures 4.0 mmHg. Aortic valve area, by VTI measures 4.51 cm.  Pulmonic Valve: The pulmonic valve was not well visualized. Pulmonic valve regurgitation is not visualized. No evidence of pulmonic stenosis.  Aorta: The aortic root is normal in size and structure.  Venous: The inferior vena cava is dilated in size with greater than 50% respiratory variability, suggesting  right atrial pressure of 8 mmHg.  IAS/Shunts: No atrial level shunt detected by color flow Doppler.   LEFT VENTRICLE PLAX 2D LVIDd:         5.40 cm   Diastology LVIDs:         3.50 cm   LV e' medial:    6.09 cm/s LV PW:         1.20 cm   LV E/e' medial:  10.1 LV IVS:        1.20 cm   LV e' lateral:   8.16 cm/s LVOT diam:     2.30 cm   LV E/e' lateral: 7.5 LV SV:         76 LV SV Index:   31 LVOT Area:     4.15 cm   RIGHT VENTRICLE             IVC RV Basal diam:  2.70 cm     IVC diam: 2.50 cm RV S prime:     11.50 cm/s TAPSE (M-mode): 2.2 cm  LEFT ATRIUM           Index        RIGHT ATRIUM          Index LA Vol (A4C): 26.6 ml 10.90 ml/m  RA Area:     7.83 cm RA Volume:   15.40 ml 6.31 ml/m AORTIC VALVE AV Area (Vmax):    4.39 cm AV Area (Vmean):   4.42 cm AV Area (VTI):     4.51 cm AV Vmax:           99.40 cm/s AV Vmean:          66.700 cm/s AV VTI:            0.169 m AV Peak Grad:      4.0 mmHg AV Mean Grad:      2.0 mmHg LVOT Vmax:         105.00 cm/s LVOT Vmean:        71.000 cm/s LVOT VTI:          0.184 m LVOT/AV VTI ratio: 1.09  AORTA Ao Root diam: 3.00 cm Ao Asc diam:  3.40 cm Ao Arch diam: 3.2 cm  MITRAL VALVE MV Area (PHT): 4.12 cm    SHUNTS MV Decel Time: 184 msec    Systemic VTI:  0.18 m MV E velocity: 61.30 cm/s  Systemic Diam: 2.30 cm MV A velocity: 78.60 cm/s MV E/A ratio:  0.78  Arvilla Meres MD Electronically signed by Arvilla Meres MD Signature Date/Time: 03/15/2022/12:51:41 PM    Final             Risk Assessment/Calculations:   {Does this patient have ATRIAL FIBRILLATION?:[id number]}        Lab Results  Component Value Date   WBC 6.3 03/15/2022   HGB 13.1 03/15/2022   HCT 39.4 03/15/2022   MCV 93.4 03/15/2022   PLT 198 03/15/2022   Lab Results  Component Value Date   CREATININE 1.14 03/15/2022   BUN 13 03/15/2022   NA 138 03/15/2022  K 4.5 03/15/2022   CL 107 03/15/2022   CO2 25 03/15/2022   Lab Results   Component Value Date   ALT 16 10/20/2019   AST 14 (L) 10/20/2019   ALKPHOS 73 10/20/2019   BILITOT 0.8 10/20/2019   Lab Results  Component Value Date   CHOL 118 04/01/2019   HDL 27 (L) 04/01/2019   LDLCALC 52 04/01/2019   LDLDIRECT 76 03/15/2022   TRIG 193 (H) 04/01/2019   CHOLHDL 4.4 04/01/2019    Lab Results  Component Value Date   HGBA1C 11.4 (H) 03/14/2022     Assessment & Plan    1.  Coronary artery disease: -s/p inferior STEMI 2014 with BMS to RCA, NSTEMI 03/2019 with occlusion of small PDA and recent NSTEMI with PCI to ramus vessel -Today patient reports*** -Continue GDMT with***  2.  Hyperlipidemia: -LDL cholesterol was*** -Continue***  3.  Essential hypertension: -Patient's blood pressure today was*** -Continue***  4.  DM type II: -Patient's A1c most recently was 11.4 -Patient managed as an outpatient by PCP  5.  Left foot ulcer: -Patient has full-thickness left foot ulcer that is followed by [facility] [place]       Disposition: Follow-up with Verne Carrow, MD or APP in *** months {Are you ordering a CV Procedure (e.g. stress test, cath, DCCV, TEE, etc)?   Press F2        :[id number]}   Medication Adjustments/Labs and Tests Ordered: Current medicines are reviewed at length with the patient today.  Concerns regarding medicines are outlined above.   Signed, Napoleon Form, Leodis Rains, NP 04/28/2022, 6:03 PM [facility]

## 2022-04-30 ENCOUNTER — Ambulatory Visit: Payer: Medicare Other | Admitting: Nurse Practitioner

## 2022-04-30 DIAGNOSIS — E785 Hyperlipidemia, unspecified: Secondary | ICD-10-CM

## 2022-04-30 DIAGNOSIS — E11621 Type 2 diabetes mellitus with foot ulcer: Secondary | ICD-10-CM

## 2022-04-30 DIAGNOSIS — I10 Essential (primary) hypertension: Secondary | ICD-10-CM

## 2022-04-30 DIAGNOSIS — E118 Type 2 diabetes mellitus with unspecified complications: Secondary | ICD-10-CM

## 2022-04-30 DIAGNOSIS — I2510 Atherosclerotic heart disease of native coronary artery without angina pectoris: Secondary | ICD-10-CM

## 2022-05-01 NOTE — Progress Notes (Signed)
Office Visit    Patient Name: Jesus Cruz Date of Encounter: 05/01/2022  Primary Care Provider:  Shelle Iron, MD Primary Cardiologist:  Verne Carrow, MD Primary Electrophysiologist: None   Past Medical History    Past Medical History:  Diagnosis Date   CVA (cerebral vascular accident) [facility])    Gout    Hypertension    MGUS (monoclonal gammopathy of unknown significance)    Obesity    OSA on CPAP    Pulmonary embolus (HCC) 05/2012   During recovery from MI, completed Coumadin   Stage 3 chronic kidney disease (HCC)    STEMI (ST elevation myocardial infarction) (HCC) 04/2012   Integrity BMS RCA with minor left system disease   Past Surgical History:  Procedure Laterality Date   CORONARY ANGIOPLASTY WITH STENT PLACEMENT  04/2012   Integrity BMS RCA, minor L system dz   CORONARY STENT INTERVENTION N/A 03/14/2022   Procedure: CORONARY STENT INTERVENTION;  Surgeon: Kathleene Hazel, MD;  Location: [facility] INVASIVE CV LAB;  Service: Cardiovascular;  Laterality: N/A;   LEFT HEART CATH AND CORONARY ANGIOGRAPHY N/A 03/31/2019   Procedure: LEFT HEART CATH AND CORONARY ANGIOGRAPHY;  Surgeon: Marykay Lex, MD;  Location: [facility] INVASIVE CV LAB;  Service: Cardiovascular;  Laterality: N/A;   LEFT HEART CATH AND CORONARY ANGIOGRAPHY N/A 03/14/2022   Procedure: LEFT HEART CATH AND CORONARY ANGIOGRAPHY;  Surgeon: Kathleene Hazel, MD;  Location: [facility] INVASIVE CV LAB;  Service: Cardiovascular;  Laterality: N/A;   RADIOLOGY WITH ANESTHESIA N/A 09/16/2013   Procedure: RADIOLOGY WITH ANESTHESIA;  Surgeon: Oneal Grout, MD;  Location: [facility] OR;  Service: Radiology;  Laterality: N/A;    Allergies  Allergies  Allergen Reactions   Other Nausea And Vomiting    Mayonnaise       History of Present Illness    Jesus Cruz  is a 57 year old male with a PMH of CAD s/p previous inferior STEMI 2014 treated with integrity BMS to RCA NSTEMI 03/2022 with PCI to ramus, 6%  residual stenosis in OM1 and mid RCA (medically managed), RPDA too small for intervention, DM type II, CVA, OSA (on CPAP), MGUS, COPD, history of PE (2014) HLD, tobacco abuse who presents today post NSTEMI follow-up.   Jesus Cruz was initially seen in 2021 when he suffered a inferior STEMI and had an integrity BMS placed to his RCA in 2014.  He was admitted 03/2019 with altered mental status and DKA.  Left heart cath was completed showing occlusion of small PDA  moderate mid RCA stenosis, mild intermediate branch/OM stenosis.  He was admitted to the ED on 03/14/2022 after transferring from [facility] with elevated troponins.  LHC was completed with DES x 1 to the intermediate branch and patient was continued on DAPT with ASA and Brilinta for 1 year.   Jesus Cruz presents today for posthospital follow-up alone.  Since last being seen in the office patient reports he has been doing well with twinges of chest discomfort that are relieved with rest.  He has not required any nitroglycerin for his discomforts.  He has been compliant with his medications and denies any adverse reactions.  His blood pressure today's well-controlled at 114/80 and heart rate is 77 bpm.  He is euvolemic on examination today and has trace lower extremity edema in his legs bilaterally.  He is working on cutting out all sugars and is abstaining from simple carbohydrates in his diet.  He is going to the wound  treatment center in [facility] for his left great toe every Monday.  He is also working on controlling his blood sugars and is getting a Dexcom meter next week.  He is interested in anticipating in cardiac rehab however there is not an order placed and I will forward this to his primary cardiologist for an order.  Patient denies chest pain, palpitations, dyspnea, PND, orthopnea, nausea, vomiting, dizziness, syncope, edema, weight gain, or early satiety.  Home Medications    Current Outpatient Medications  Medication Sig Dispense  Refill   allopurinol (ZYLOPRIM) 300 MG tablet Take 400 mg by mouth 2 (two) times daily.      aspirin EC 81 MG tablet Take 81 mg by mouth at bedtime.     atorvastatin (LIPITOR) 80 MG tablet Take 1 tablet (80 mg total) by mouth at bedtime. 90 tablet 3   blood glucose meter kit and supplies Dispense based on patient and insurance preference. Use up to four times daily as directed. (FOR ICD-10 E10.9, E11.9). 1 each 0   clotrimazole-betamethasone (LOTRISONE) cream Apply 1 application topically in the morning and at bedtime.     colchicine 0.6 MG tablet Take 0.6 mg by mouth daily.     cyclobenzaprine (FLEXERIL) 10 MG tablet Take 1 tablet (10 mg total) by mouth 3 (three) times daily as needed for muscle spasms. (Patient taking differently: Take 10 mg by mouth in the morning and at bedtime.) 30 tablet 0   doxazosin (CARDURA) 2 MG tablet Take 2 mg by mouth at bedtime.     glimepiride (AMARYL) 4 MG tablet Take 1 tablet (4 mg total) by mouth at bedtime. 30 tablet 1   HYDROcodone-acetaminophen (NORCO/VICODIN) 5-325 MG tablet Take 1 tablet by mouth 3 (three) times daily.     Insulin Pen Needle 31G X 5 MM MISC Use with Levemir flexpen 100 each 1   LANTUS SOLOSTAR 100 UNIT/ML Solostar Pen Inject 9 Units into the skin daily. 15 mL 11   lisinopril (ZESTRIL) 10 MG tablet Take 10 mg by mouth daily.     metoprolol succinate (TOPROL-XL) 25 MG 24 hr tablet Take 0.5 tablets (12.5 mg total) by mouth daily. 45 tablet 3   morphine (MS CONTIN) 15 MG 12 hr tablet Take 15 mg by mouth 3 (three) times daily.     naloxone (NARCAN) nasal spray 4 mg/0.1 mL Place 0.4 mg into the nose once.      nitroGLYCERIN (NITROSTAT) 0.4 MG SL tablet Place 0.4 mg under the tongue every 5 (five) minutes as needed for chest pain (max 3 doses).      nystatin (MYCOSTATIN/NYSTOP) powder Apply 1 application topically daily.     pantoprazole (PROTONIX) 40 MG tablet Take 1 tablet (40 mg total) by mouth 2 (two) times daily. 60 tablet 1   predniSONE  (DELTASONE) 5 MG tablet Take 5 mg by mouth daily with breakfast.     pregabalin (LYRICA) 100 MG capsule Take 100 mg by mouth 3 (three) times daily.      ticagrelor (BRILINTA) 90 MG TABS tablet Take 1 tablet (90 mg total) by mouth 2 (two) times daily. 60 tablet 11   No current facility-administered medications for this visit.     Review of Systems  Please see the history of present illness.    (+) Left lower toe pain (+) Body and joint pain  All other systems reviewed and are otherwise negative except as noted above.  Physical Exam    Wt Readings from Last 3 Encounters:  03/14/22 (!) 311 lb 1.1 oz (141.1 kg)  10/23/19 (!) 311 lb 1.1 oz (141.1 kg)  05/31/19 (!) 311 lb 3.2 oz (141.2 kg)   VZ:DGLOV were no vitals filed for this visit.,There is no height or weight on file to calculate BMI.  Constitutional:      Appearance: Healthy appearance. Not in distress.  Neck:     Vascular: JVD normal.  Pulmonary:     Effort: Pulmonary effort is normal.     Breath sounds: No wheezing. No rales. Diminished in the bases Cardiovascular:     Normal rate. Regular rhythm. Normal S1. Normal S2.      Murmurs: There is no murmur.  Edema:    Peripheral edema absent.  Abdominal:     Palpations: Abdomen is soft non tender. There is no hepatomegaly.  Skin:    General: Skin is warm and dry.  Neurological:     General: No focal deficit present.     Mental Status: Alert and oriented to person, place and time.     Cranial Nerves: Cranial nerves are intact.  EKG/LABS/ Recent Cardiac Studies    ECG personally reviewed by me today -none completed today  Cardiac Studies & Procedures   CARDIAC CATHETERIZATION  CARDIAC CATHETERIZATION 03/14/2022  Narrative   Prox RCA lesion is 30% stenosed.   1st Diag lesion is 40% stenosed.   1st Mrg lesion is 60% stenosed.   RPDA lesion is 100% stenosed.   Ramus lesion is 99% stenosed.   Mid RCA lesion is 60% stenosed.   Prox RCA to Mid RCA lesion is 20%  stenosed.   A drug-eluting stent was successfully placed using a SYNERGY XD 2.50X16.   Post intervention, there is a 0% residual stenosis.  The left main is a very large vessel. There are 4 branches that arise from the left main. Mild non-obstructive plaque in the LAD. The first intermediate branch has mild non-obstructive disease. The other intermediate branch has a severe mid vessel stenosis. (Felt to be the culprit vessel). The Circumflex has mild to moderate mid vessel stenosis. The obtuse marginal branch has moderate stenosis, unchanged from last cath. The RCA is a large dominant vessel with a moderate proximal stenosis, patent mid vessel stented segment with minimal restenosis. The mid RCA beyond the stent has a focal, eccentric calcified stenosis that is unchanged from last cath. This does not appear to be flow limiting. Chronic occlusion of the PDA Successful PTCA/DES x 1 intermediate branch.  Recommendations: DAPT with ASA and Brilinta for one year. High intensity statin. Add back home medications as BP tolerates. We will ask the DM management team to see him to discuss his home diabetes regimen. Echo tomorrow to assess LV function.  Findings Coronary Findings Diagnostic  Dominance: Co-dominant  Left Main Vessel is large. Very large, patulous vessel with 4 branches  Left Anterior Descending There is mild diffuse disease throughout the vessel.  First Diagonal Branch Vessels moderate-large in size There is mild disease in the vessel. Vessel actually appears to be a tandem ramus intermedius branch 1st Diag lesion is 40% stenosed. The lesion is irregular.  First Septal Branch Vessel is small in size.  Second Diagonal Branch Vessel is small in size.  Third Diagonal Branch Vessel is small in size.  Ramus Intermedius Vessel is large. There is mild diffuse disease throughout the vessel. Ramus lesion is 99% stenosed. The lesion is segmental, eccentric and irregular.  Left  Circumflex Vessel is large. There is mild diffuse disease  throughout the vessel.  First Obtuse Marginal Branch There is mild disease in the vessel. 1st Mrg lesion is 60% stenosed. The lesion is discrete and irregular.  Left Posterior Atrioventricular Artery Vessel is large in size.  Right Coronary Artery Prox RCA lesion is 30% stenosed. Prox RCA to Mid RCA lesion is 20% stenosed. The lesion was previously treated using a stent (unknown type) over 2 years ago. Mid RCA lesion is 60% stenosed. The lesion is located at the bend. The lesion is calcified.  Acute Marginal Branch Vessel is small in size.  Right Ventricular Branch Vessel is small in size.  Right Posterior Descending Artery Vessel is small in size. RPDA lesion is 100% stenosed. Vessel is the culprit lesion. Vessel is relatively small in caliber, not amenable to PCI.  Right Posterior Atrioventricular Artery Vessel is small in size.  Intervention  Ramus lesion Stent CATH VISTA GUIDE 6FR XBLAD3.5 guide catheter was inserted. Lesion crossed with guidewire using a WIRE COUGAR XT STRL 190CM. A drug-eluting stent was successfully placed using a SYNERGY XD 2.50X16. Stent strut is well apposed. Post-stent angioplasty was performed using a BALLN [place] EMERGE MR 2.75X8. Post-Intervention Lesion Assessment The intervention was successful. Pre-interventional TIMI flow is 3. Post-intervention TIMI flow is 3. No complications occurred at this lesion. There is a 0% residual stenosis post intervention.   CARDIAC CATHETERIZATION  CARDIAC CATHETERIZATION 03/31/2019  Narrative Images from the original result were not included.   LIKELY CULPRIT LESION: Mid PDA (small caliber) 100% -> not good PCI target.  Prox RCA lesion is 30% stenosed.  Previously placed Prox RCA to Mid RCA stent (unknown type) is widely patent.  Mid RCA lesion is 60% stenosed.  1st Diag/Ramus lesion is 40% stenosed.  Ramus/OM lesion is 50% stenosed.  1st  Mrg/Distal LCx lesion is 60% stenosed.  LV end diastolic pressure is normal.  POST-OPERATIVE DIAGNOSIS:  Likely culprit lesion is mid PDA (small caliber) occlusion -> plan medical management  Otherwise patent RCA stent with diffuse moderate mid RCA disease (60%), 46% stenosis in tandem ramus branches and OM1  Normal LVEDP with systemic hypotension (blood pressures ranging from high 80s to low 90s) -patient mentating well.  Does not appear to be true shock (likely related to sedation)  Hyperdynamic cardiac motion (very difficult to engage coronary arteries)  Large patulous left main with 4 major branches  PLAN OF CARE:  Transfer back to MICU (2 M) -> we will restart IV heparin 8 hours after sheath removal.  Would run IV heparin for total 72 hours including last night.  As needed pain meds  We will defer to consulting team, but could consider clopidogrel (we will load today)  We will defer management of renal failure to primary team/nephrology.     Bryan Lemma, M.D., M.S. Interventional Cardiologist  [address] [place], [place] [address]  Findings Coronary Findings Diagnostic  Dominance: Co-dominant  Left Main Vessel is large. Very large, patulous vessel with 4 branches  Left Anterior Descending There is mild diffuse disease throughout the vessel.  First Diagonal Branch Vessels moderate-large in size There is mild disease in the vessel. Vessel actually appears to be a tandem ramus intermedius branch 1st Diag lesion is 40% stenosed. The lesion is irregular.  First Septal Branch Vessel is small in size.  Second Diagonal Branch Vessel is small in size.  Third Diagonal Branch Vessel is small in size.  Ramus Intermedius Vessel is large. There is mild diffuse disease throughout the vessel. Ramus lesion  is 50% stenosed. The lesion is segmental, eccentric and irregular.  Left Circumflex Vessel is large. There is mild diffuse disease throughout the  vessel.  First Obtuse Marginal Branch There is mild disease in the vessel. 1st Mrg lesion is 60% stenosed. The lesion is discrete and irregular.  Left Posterior Atrioventricular Artery Vessel is large in size.  Right Coronary Artery Prox RCA lesion is 30% stenosed. Previously placed Prox RCA to Mid RCA stent (unknown type) is widely patent. Mid RCA lesion is 60% stenosed. The lesion is located at the bend.  Acute Marginal Branch Vessel is small in size.  Right Ventricular Branch Vessel is small in size.  Right Posterior Descending Artery Vessel is small in size. RPDA lesion is 100% stenosed. Vessel is the culprit lesion. Vessel is relatively small in caliber, not amenable to PCI.  Right Posterior Atrioventricular Artery Vessel is small in size.  Intervention  No interventions have been documented.     ECHOCARDIOGRAM  ECHOCARDIOGRAM COMPLETE 03/15/2022  Narrative ECHOCARDIOGRAM REPORT    Patient Name:   Jesus Cruz Date of Exam: 03/15/2022 Medical Rec #:  [id number]          Height:       67.0 in Accession #:    [id number]         Weight:       311.1 lb Date of Birth:  Sep 03, 1965           BSA:          2.440 m Patient Age:    57 years           BP:           115/82 mmHg Patient Gender: M                  HR:           90 bpm. Exam Location:  Inpatient  Procedure: 2D Echo, Color Doppler, Cardiac Doppler and Intracardiac Opacification Agent  Indications:    I25.10 CAD  History:        Patient has prior history of Echocardiogram examinations, most recent 03/31/2019. CAD, Stroke and Acute renal failure; CKD, Signs/Symptoms:Edema; Risk Factors:Diabetes, Hypertension, Dyslipidemia and Current Smoker.  Sonographer:    Dondra Prader RVT RCS Referring Phys: [id number] [address] D [facility]   Sonographer Comments: Technically challenging study due to limited acoustic windows, suboptimal parasternal window, Technically difficult study due to poor echo windows, suboptimal  apical window and patient is obese. Image acquisition challenging due to patient body habitus. IMPRESSIONS   1. Left ventricular ejection fraction, by estimation, is 60 to 65%. The left ventricle has normal function. Left ventricular endocardial border not optimally defined to evaluate regional wall motion. There is mild concentric left ventricular hypertrophy. Left ventricular diastolic parameters are consistent with Grade I diastolic dysfunction (impaired relaxation). 2. Right ventricular systolic function is normal. The right ventricular size is normal. 3. The mitral valve is grossly normal. No evidence of mitral valve regurgitation. No evidence of mitral stenosis. 4. The aortic valve is tricuspid. There is mild calcification of the aortic valve. Aortic valve regurgitation is not visualized. Aortic valve sclerosis/calcification is present, without any evidence of aortic stenosis. 5. The inferior vena cava is dilated in size with >50% respiratory variability, suggesting right atrial pressure of 8 mmHg. 6. Technically difficult study due to poor sound wave images.  FINDINGS Left Ventricle: Left ventricular ejection fraction, by estimation, is 60 to 65%. The left ventricle has normal function. Left  ventricular endocardial border not optimally defined to evaluate regional wall motion. The left ventricular internal cavity size was normal in size. There is mild concentric left ventricular hypertrophy. Left ventricular diastolic parameters are consistent with Grade I diastolic dysfunction (impaired relaxation).  Right Ventricle: The right ventricular size is normal. No increase in right ventricular wall thickness. Right ventricular systolic function is normal.  Left Atrium: Left atrial size was normal in size.  Right Atrium: Right atrial size was normal in size.  Pericardium: There is no evidence of pericardial effusion.  Mitral Valve: The mitral valve is grossly normal. No evidence of mitral  valve regurgitation. No evidence of mitral valve stenosis.  Tricuspid Valve: The tricuspid valve is normal in structure. Tricuspid valve regurgitation is trivial. No evidence of tricuspid stenosis.  Aortic Valve: The aortic valve is tricuspid. There is mild calcification of the aortic valve. Aortic valve regurgitation is not visualized. Aortic valve sclerosis/calcification is present, without any evidence of aortic stenosis. Aortic valve mean gradient measures 2.0 mmHg. Aortic valve peak gradient measures 4.0 mmHg. Aortic valve area, by VTI measures 4.51 cm.  Pulmonic Valve: The pulmonic valve was not well visualized. Pulmonic valve regurgitation is not visualized. No evidence of pulmonic stenosis.  Aorta: The aortic root is normal in size and structure.  Venous: The inferior vena cava is dilated in size with greater than 50% respiratory variability, suggesting right atrial pressure of 8 mmHg.  IAS/Shunts: No atrial level shunt detected by color flow Doppler.   LEFT VENTRICLE PLAX 2D LVIDd:         5.40 cm   Diastology LVIDs:         3.50 cm   LV e' medial:    6.09 cm/s LV PW:         1.20 cm   LV E/e' medial:  10.1 LV IVS:        1.20 cm   LV e' lateral:   8.16 cm/s LVOT diam:     2.30 cm   LV E/e' lateral: 7.5 LV SV:         76 LV SV Index:   31 LVOT Area:     4.15 cm   RIGHT VENTRICLE             IVC RV Basal diam:  2.70 cm     IVC diam: 2.50 cm RV S prime:     11.50 cm/s TAPSE (M-mode): 2.2 cm  LEFT ATRIUM           Index        RIGHT ATRIUM          Index LA Vol (A4C): 26.6 ml 10.90 ml/m  RA Area:     7.83 cm RA Volume:   15.40 ml 6.31 ml/m AORTIC VALVE AV Area (Vmax):    4.39 cm AV Area (Vmean):   4.42 cm AV Area (VTI):     4.51 cm AV Vmax:           99.40 cm/s AV Vmean:          66.700 cm/s AV VTI:            0.169 m AV Peak Grad:      4.0 mmHg AV Mean Grad:      2.0 mmHg LVOT Vmax:         105.00 cm/s LVOT Vmean:        71.000 cm/s LVOT VTI:           0.184 m  LVOT/AV VTI ratio: 1.09  AORTA Ao Root diam: 3.00 cm Ao Asc diam:  3.40 cm Ao Arch diam: 3.2 cm  MITRAL VALVE MV Area (PHT): 4.12 cm    SHUNTS MV Decel Time: 184 msec    Systemic VTI:  0.18 m MV E velocity: 61.30 cm/s  Systemic Diam: 2.30 cm MV A velocity: 78.60 cm/s MV E/A ratio:  0.78  Arvilla Meres MD Electronically signed by Arvilla Meres MD Signature Date/Time: 03/15/2022/12:51:41 PM    Final             Risk Assessment/Calculations:            Lab Results  Component Value Date   WBC 6.3 03/15/2022   HGB 13.1 03/15/2022   HCT 39.4 03/15/2022   MCV 93.4 03/15/2022   PLT 198 03/15/2022   Lab Results  Component Value Date   CREATININE 1.14 03/15/2022   BUN 13 03/15/2022   NA 138 03/15/2022   K 4.5 03/15/2022   CL 107 03/15/2022   CO2 25 03/15/2022   Lab Results  Component Value Date   ALT 16 10/20/2019   AST 14 (L) 10/20/2019   ALKPHOS 73 10/20/2019   BILITOT 0.8 10/20/2019   Lab Results  Component Value Date   CHOL 118 04/01/2019   HDL 27 (L) 04/01/2019   LDLCALC 52 04/01/2019   LDLDIRECT 76 03/15/2022   TRIG 193 (H) 04/01/2019   CHOLHDL 4.4 04/01/2019    Lab Results  Component Value Date   HGBA1C 11.4 (H) 03/14/2022     Assessment & Plan    1.  Coronary artery disease: -s/p inferior STEMI 2014 with BMS to RCA, NSTEMI 03/2019 with occlusion of small PDA and recent NSTEMI with PCI to ramus vessel -Today patient reports some occasional episodes of twinges of chest pain that are relieved with rest and have not required nitroglycerin. -He denies any bleeding with his Brilinta and has been compliant with DAPT. -Continue GDMT with ASA 81 mg, Lipitor 80 mg, Toprol 12.5 mg daily, Brilinta 90 mg twice daily -He was advised to seek care in the ED if chest pain returns and is not relieved with rest and as needed nitroglycerin. -He is interested in cardiac rehab and is cleared to proceed at this time. -We will reach out to his primary  cardiologist for placing an order and recommendations.   2.  Hyperlipidemia: -LDL cholesterol was 76 -He will have lipids and LFTs checked by his PCP -Continue atorvastatin 80 mg daily   3.  Essential hypertension: -Patient's blood pressure today was well-controlled at 114/80 -Continue Cardura 2 mg, Zestril 10 mg daily, Toprol 12.5 mg daily   4.  DM type II: -Patient's A1c most recently was 11.4 -Patient managed as an outpatient by PCP   5.  Left foot ulcer: -Patient has full-thickness left foot ulcer that is followed by wound center of [place]    Disposition: Follow-up with Verne Carrow, MD or APP in 3 months    Medication Adjustments/Labs and Tests Ordered: Current medicines are reviewed at length with the patient today.  Concerns regarding medicines are outlined above.   Signed, Napoleon Form, Leodis Rains, NP 05/01/2022, 8:10 PM [facility]

## 2022-05-02 ENCOUNTER — Ambulatory Visit: Payer: Medicare Other | Attending: Nurse Practitioner | Admitting: Nurse Practitioner

## 2022-05-02 ENCOUNTER — Encounter: Payer: Self-pay | Admitting: Nurse Practitioner

## 2022-05-02 VITALS — BP 114/80 | HR 77 | Ht 67.0 in | Wt 304.0 lb

## 2022-05-02 DIAGNOSIS — E118 Type 2 diabetes mellitus with unspecified complications: Secondary | ICD-10-CM

## 2022-05-02 DIAGNOSIS — I10 Essential (primary) hypertension: Secondary | ICD-10-CM | POA: Diagnosis not present

## 2022-05-02 DIAGNOSIS — E785 Hyperlipidemia, unspecified: Secondary | ICD-10-CM | POA: Diagnosis not present

## 2022-05-02 DIAGNOSIS — L97529 Non-pressure chronic ulcer of other part of left foot with unspecified severity: Secondary | ICD-10-CM

## 2022-05-02 DIAGNOSIS — I2510 Atherosclerotic heart disease of native coronary artery without angina pectoris: Secondary | ICD-10-CM

## 2022-05-02 DIAGNOSIS — E11621 Type 2 diabetes mellitus with foot ulcer: Secondary | ICD-10-CM

## 2022-05-02 NOTE — Patient Instructions (Signed)
Medication Instructions:  Your physician recommends that you continue on your current medications as directed. Please refer to the Current Medication list given to you today. *If you need a refill on your cardiac medications before your next appointment, please call your pharmacy*   Lab Work: Get your cholesterol checked at your primary care providers office (LIPIDS & LFT) If you have labs (blood work) drawn today and your tests are completely normal, you will receive your results only by: MyChart Message (if you have MyChart) OR A paper copy in the mail If you have any lab test that is abnormal or we need to change your treatment, we will call you to review the results.   Testing/Procedures: NONE ORDERED   Follow-Up: At [facility], you and your health needs are our priority.  As part of our continuing mission to provide you with exceptional heart care, we have created designated Provider Care Teams.  These Care Teams include your primary Cardiologist (physician) and Advanced Practice Providers (APPs -  Physician Assistants and Nurse Practitioners) who all work together to provide you with the care you need, when you need it.  We recommend signing up for the patient portal called "MyChart".  Sign up information is provided on this After Visit Summary.  MyChart is used to connect with patients for Virtual Visits (Telemedicine).  Patients are able to view lab/test results, encounter notes, upcoming appointments, etc.  Non-urgent messages can be sent to your provider as well.   To learn more about what you can do with MyChart, go to [email or web address].    Your next appointment:   3 month(s)  Provider:   Verne Carrow, MD     Other Instructions

## 2022-05-09 ENCOUNTER — Other Ambulatory Visit: Payer: Self-pay | Admitting: Cardiovascular Disease

## 2022-06-10 ENCOUNTER — Other Ambulatory Visit: Payer: Self-pay | Admitting: Cardiovascular Disease

## 2022-10-20 ENCOUNTER — Other Ambulatory Visit: Payer: Self-pay | Admitting: Cardiovascular Disease

## 2022-11-15 ENCOUNTER — Other Ambulatory Visit: Payer: Self-pay | Admitting: Cardiovascular Disease

## 2022-11-15 NOTE — Telephone Encounter (Signed)
Pt's pharmacy is requesting a refill on glimepiride. This is not a cardiac medication. Would Dr. Clifton James like to refill this medication? Please address

## 2023-03-05 ENCOUNTER — Other Ambulatory Visit: Payer: Self-pay | Admitting: Cardiology

## 2023-10-15 ENCOUNTER — Other Ambulatory Visit: Payer: Self-pay | Admitting: Cardiovascular Disease

## 2023-10-17 NOTE — Telephone Encounter (Signed)
 Pt of Dr. Verlin. Please advise on a refill of this RX.
